# Patient Record
Sex: Male | Born: 1940
Health system: Southern US, Community
[De-identification: ages and names within clinical notes are randomized; demographics above are authoritative.]

## PROBLEM LIST (undated history)

## (undated) DIAGNOSIS — I10 Essential (primary) hypertension: Secondary | ICD-10-CM

## (undated) DIAGNOSIS — I4891 Unspecified atrial fibrillation: Secondary | ICD-10-CM

## (undated) DIAGNOSIS — M109 Gout, unspecified: Secondary | ICD-10-CM

## (undated) DIAGNOSIS — B029 Zoster without complications: Secondary | ICD-10-CM

## (undated) HISTORY — PX: HERNIA REPAIR: SHX51

## (undated) HISTORY — DX: Zoster without complications: B02.9

## (undated) HISTORY — DX: Gout, unspecified: M10.9

## (undated) HISTORY — PX: NO PAST SURGERIES: SHX2092

## (undated) HISTORY — DX: Essential (primary) hypertension: I10

---

## 2000-02-08 ENCOUNTER — Ambulatory Visit (HOSPITAL_BASED_OUTPATIENT_CLINIC_OR_DEPARTMENT_OTHER): Admission: RE | Admit: 2000-02-08 | Discharge: 2000-02-08 | Payer: Self-pay

## 2003-12-24 ENCOUNTER — Encounter: Payer: Self-pay | Admitting: Internal Medicine

## 2004-09-20 ENCOUNTER — Ambulatory Visit: Payer: Self-pay | Admitting: Internal Medicine

## 2005-02-18 ENCOUNTER — Ambulatory Visit: Payer: Self-pay | Admitting: Internal Medicine

## 2005-03-03 ENCOUNTER — Ambulatory Visit: Payer: Self-pay | Admitting: Internal Medicine

## 2005-09-05 ENCOUNTER — Ambulatory Visit: Payer: Self-pay | Admitting: Internal Medicine

## 2005-09-19 ENCOUNTER — Emergency Department (HOSPITAL_COMMUNITY): Admission: EM | Admit: 2005-09-19 | Discharge: 2005-09-19 | Payer: Self-pay | Admitting: Emergency Medicine

## 2005-09-29 ENCOUNTER — Ambulatory Visit: Payer: Self-pay | Admitting: Internal Medicine

## 2006-03-20 ENCOUNTER — Ambulatory Visit: Payer: Self-pay | Admitting: Internal Medicine

## 2006-09-19 ENCOUNTER — Ambulatory Visit: Payer: Self-pay | Admitting: Internal Medicine

## 2006-12-27 ENCOUNTER — Ambulatory Visit: Payer: Self-pay | Admitting: Internal Medicine

## 2007-03-26 ENCOUNTER — Encounter: Payer: Self-pay | Admitting: Internal Medicine

## 2007-03-26 ENCOUNTER — Ambulatory Visit: Payer: Self-pay | Admitting: Internal Medicine

## 2007-03-26 DIAGNOSIS — E785 Hyperlipidemia, unspecified: Secondary | ICD-10-CM | POA: Insufficient documentation

## 2007-03-26 DIAGNOSIS — Z860100 Personal history of colon polyps, unspecified: Secondary | ICD-10-CM | POA: Insufficient documentation

## 2007-03-26 DIAGNOSIS — M109 Gout, unspecified: Secondary | ICD-10-CM

## 2007-03-26 DIAGNOSIS — M1990 Unspecified osteoarthritis, unspecified site: Secondary | ICD-10-CM | POA: Insufficient documentation

## 2007-03-26 DIAGNOSIS — E7800 Pure hypercholesterolemia, unspecified: Secondary | ICD-10-CM | POA: Insufficient documentation

## 2007-03-26 DIAGNOSIS — I10 Essential (primary) hypertension: Secondary | ICD-10-CM

## 2007-03-26 LAB — CONVERTED CEMR LAB
ALT: 22 units/L (ref 0–40)
AST: 23 units/L (ref 0–37)
Albumin: 3.8 g/dL (ref 3.5–5.2)
Alkaline Phosphatase: 54 units/L (ref 39–117)
BUN: 17 mg/dL (ref 6–23)
Basophils Absolute: 0 10*3/uL (ref 0.0–0.1)
Basophils Relative: 0.2 % (ref 0.0–1.0)
Bilirubin, Direct: 0.1 mg/dL (ref 0.0–0.3)
CO2: 33 meq/L — ABNORMAL HIGH (ref 19–32)
Calcium: 9.2 mg/dL (ref 8.4–10.5)
Chloride: 110 meq/L (ref 96–112)
Cholesterol: 170 mg/dL (ref 0–200)
Creatinine, Ser: 1.2 mg/dL (ref 0.4–1.5)
Eosinophils Absolute: 0.1 10*3/uL (ref 0.0–0.6)
Eosinophils Relative: 2.3 % (ref 0.0–5.0)
GFR calc Af Amer: 78 mL/min
GFR calc non Af Amer: 64 mL/min
Glucose, Bld: 96 mg/dL (ref 70–99)
HCT: 48.1 % (ref 39.0–52.0)
HDL: 29.9 mg/dL — ABNORMAL LOW (ref >39.0)
Hemoglobin: 16.5 g/dL (ref 13.0–17.0)
LDL Cholesterol: 101 mg/dL — ABNORMAL HIGH (ref 0–99)
Lymphocytes Relative: 43.3 % (ref 12.0–46.0)
MCHC: 34.3 g/dL (ref 30.0–36.0)
MCV: 88.8 fL (ref 78.0–100.0)
Monocytes Absolute: 0.4 10*3/uL (ref 0.2–0.7)
Monocytes Relative: 10.4 % (ref 3.0–11.0)
Neutro Abs: 1.5 10*3/uL (ref 1.4–7.7)
Neutrophils Relative %: 43.8 % (ref 43.0–77.0)
PSA: 2.71 ng/mL (ref 0.10–4.00)
Platelets: 180 10*3/uL (ref 150–400)
Potassium: 4.3 meq/L (ref 3.5–5.1)
RBC: 5.42 M/uL (ref 4.22–5.81)
RDW: 13.2 % (ref 11.5–14.6)
Sodium: 145 meq/L (ref 135–145)
Total Bilirubin: 1.3 mg/dL — ABNORMAL HIGH (ref 0.3–1.2)
Total CHOL/HDL Ratio: 5.7
Total Protein: 7 g/dL (ref 6.0–8.3)
Triglycerides: 194 mg/dL — ABNORMAL HIGH (ref 0–149)
VLDL: 39 mg/dL (ref 0–40)
WBC: 3.4 10*3/uL — ABNORMAL LOW (ref 4.5–10.5)

## 2007-06-13 ENCOUNTER — Encounter: Payer: Self-pay | Admitting: Internal Medicine

## 2007-09-21 DIAGNOSIS — K5730 Diverticulosis of large intestine without perforation or abscess without bleeding: Secondary | ICD-10-CM | POA: Insufficient documentation

## 2007-09-24 ENCOUNTER — Ambulatory Visit: Payer: Self-pay | Admitting: Internal Medicine

## 2007-10-23 ENCOUNTER — Ambulatory Visit: Payer: Self-pay | Admitting: Internal Medicine

## 2007-10-23 DIAGNOSIS — H60509 Unspecified acute noninfective otitis externa, unspecified ear: Secondary | ICD-10-CM | POA: Insufficient documentation

## 2008-04-21 ENCOUNTER — Ambulatory Visit: Payer: Self-pay | Admitting: Internal Medicine

## 2008-08-18 ENCOUNTER — Ambulatory Visit: Payer: Self-pay | Admitting: Internal Medicine

## 2008-08-18 DIAGNOSIS — J069 Acute upper respiratory infection, unspecified: Secondary | ICD-10-CM | POA: Insufficient documentation

## 2008-10-20 ENCOUNTER — Ambulatory Visit: Payer: Self-pay | Admitting: Internal Medicine

## 2008-11-26 ENCOUNTER — Telehealth: Payer: Self-pay | Admitting: Internal Medicine

## 2008-11-27 ENCOUNTER — Encounter (INDEPENDENT_AMBULATORY_CARE_PROVIDER_SITE_OTHER): Payer: Self-pay | Admitting: *Deleted

## 2008-12-10 ENCOUNTER — Ambulatory Visit: Payer: Self-pay | Admitting: Internal Medicine

## 2008-12-23 ENCOUNTER — Telehealth: Payer: Self-pay | Admitting: Internal Medicine

## 2008-12-23 ENCOUNTER — Ambulatory Visit: Payer: Self-pay | Admitting: Internal Medicine

## 2008-12-23 ENCOUNTER — Encounter: Payer: Self-pay | Admitting: Internal Medicine

## 2008-12-26 ENCOUNTER — Encounter: Payer: Self-pay | Admitting: Internal Medicine

## 2009-01-12 ENCOUNTER — Encounter: Payer: Self-pay | Admitting: Internal Medicine

## 2009-01-22 ENCOUNTER — Telehealth: Payer: Self-pay | Admitting: Internal Medicine

## 2009-04-21 ENCOUNTER — Encounter: Payer: Self-pay | Admitting: Internal Medicine

## 2009-04-22 ENCOUNTER — Ambulatory Visit: Payer: Self-pay | Admitting: Internal Medicine

## 2009-04-22 LAB — CONVERTED CEMR LAB
ALT: 15 units/L (ref 0–53)
AST: 20 units/L (ref 0–37)
Albumin: 3.7 g/dL (ref 3.5–5.2)
Alkaline Phosphatase: 50 units/L (ref 39–117)
BUN: 23 mg/dL (ref 6–23)
Basophils Absolute: 0 10*3/uL (ref 0.0–0.1)
Basophils Relative: 0.2 % (ref 0.0–3.0)
Bilirubin, Direct: 0.1 mg/dL (ref 0.0–0.3)
CO2: 30 meq/L (ref 19–32)
Calcium: 8.9 mg/dL (ref 8.4–10.5)
Chloride: 104 meq/L (ref 96–112)
Cholesterol: 149 mg/dL (ref 0–200)
Creatinine, Ser: 1.3 mg/dL (ref 0.4–1.5)
Eosinophils Absolute: 0.1 10*3/uL (ref 0.0–0.7)
Eosinophils Relative: 1.9 % (ref 0.0–5.0)
GFR calc non Af Amer: 58.17 mL/min (ref >60)
Glucose, Bld: 100 mg/dL — ABNORMAL HIGH (ref 70–99)
HCT: 47.1 % (ref 39.0–52.0)
HDL: 29.2 mg/dL — ABNORMAL LOW (ref >39.00)
Hemoglobin: 16.3 g/dL (ref 13.0–17.0)
LDL Cholesterol: 91 mg/dL (ref 0–99)
Lymphocytes Relative: 40 % (ref 12.0–46.0)
Lymphs Abs: 1.5 10*3/uL (ref 0.7–4.0)
MCHC: 34.7 g/dL (ref 30.0–36.0)
MCV: 89.6 fL (ref 78.0–100.0)
Monocytes Absolute: 0.5 10*3/uL (ref 0.1–1.0)
Monocytes Relative: 12.7 % — ABNORMAL HIGH (ref 3.0–12.0)
Neutro Abs: 1.6 10*3/uL (ref 1.4–7.7)
Neutrophils Relative %: 45.2 % (ref 43.0–77.0)
PSA: 2.74 ng/mL (ref 0.10–4.00)
Platelets: 148 10*3/uL — ABNORMAL LOW (ref 150.0–400.0)
Potassium: 4.5 meq/L (ref 3.5–5.1)
RBC: 5.26 M/uL (ref 4.22–5.81)
RDW: 12.8 % (ref 11.5–14.6)
Sodium: 141 meq/L (ref 135–145)
TSH: 0.92 microintl units/mL (ref 0.35–5.50)
Total Bilirubin: 1.9 mg/dL — ABNORMAL HIGH (ref 0.3–1.2)
Total CHOL/HDL Ratio: 5
Total Protein: 6.6 g/dL (ref 6.0–8.3)
Triglycerides: 146 mg/dL (ref 0.0–149.0)
VLDL: 29.2 mg/dL (ref 0.0–40.0)
WBC: 3.7 10*3/uL — ABNORMAL LOW (ref 4.5–10.5)

## 2009-07-01 ENCOUNTER — Encounter: Payer: Self-pay | Admitting: Internal Medicine

## 2009-08-04 ENCOUNTER — Encounter (INDEPENDENT_AMBULATORY_CARE_PROVIDER_SITE_OTHER): Payer: Self-pay

## 2009-10-28 ENCOUNTER — Ambulatory Visit: Payer: Self-pay | Admitting: Internal Medicine

## 2010-04-26 ENCOUNTER — Ambulatory Visit: Payer: Self-pay | Admitting: Internal Medicine

## 2010-04-26 LAB — CONVERTED CEMR LAB
ALT: 14 units/L (ref 0–53)
AST: 16 units/L (ref 0–37)
Albumin: 3.7 g/dL (ref 3.5–5.2)
Alkaline Phosphatase: 52 units/L (ref 39–117)
BUN: 27 mg/dL — ABNORMAL HIGH (ref 6–23)
Basophils Absolute: 0 10*3/uL (ref 0.0–0.1)
Basophils Relative: 0.6 % (ref 0.0–3.0)
Bilirubin, Direct: 0.2 mg/dL (ref 0.0–0.3)
CO2: 32 meq/L (ref 19–32)
Calcium: 9.2 mg/dL (ref 8.4–10.5)
Chloride: 103 meq/L (ref 96–112)
Cholesterol: 154 mg/dL (ref 0–200)
Creatinine, Ser: 1.3 mg/dL (ref 0.4–1.5)
Direct LDL: 69.5 mg/dL
Eosinophils Absolute: 0.1 10*3/uL (ref 0.0–0.7)
Eosinophils Relative: 2.2 % (ref 0.0–5.0)
GFR calc non Af Amer: 60.69 mL/min (ref >60)
Glucose, Bld: 95 mg/dL (ref 70–99)
HCT: 47.7 % (ref 39.0–52.0)
HDL: 28.5 mg/dL — ABNORMAL LOW (ref >39.00)
Hemoglobin: 16.4 g/dL (ref 13.0–17.0)
Lymphocytes Relative: 37.9 % (ref 12.0–46.0)
Lymphs Abs: 1.5 10*3/uL (ref 0.7–4.0)
MCHC: 34.4 g/dL (ref 30.0–36.0)
MCV: 90.7 fL (ref 78.0–100.0)
Monocytes Absolute: 0.5 10*3/uL (ref 0.1–1.0)
Monocytes Relative: 13 % — ABNORMAL HIGH (ref 3.0–12.0)
Neutro Abs: 1.9 10*3/uL (ref 1.4–7.7)
Neutrophils Relative %: 46.3 % (ref 43.0–77.0)
PSA: 2.67 ng/mL (ref 0.10–4.00)
Platelets: 160 10*3/uL (ref 150.0–400.0)
Potassium: 4.7 meq/L (ref 3.5–5.1)
RBC: 5.26 M/uL (ref 4.22–5.81)
RDW: 13.9 % (ref 11.5–14.6)
Sodium: 140 meq/L (ref 135–145)
TSH: 1.51 microintl units/mL (ref 0.35–5.50)
Total Bilirubin: 1.4 mg/dL — ABNORMAL HIGH (ref 0.3–1.2)
Total CHOL/HDL Ratio: 5
Total Protein: 6.3 g/dL (ref 6.0–8.3)
Triglycerides: 252 mg/dL — ABNORMAL HIGH (ref 0.0–149.0)
VLDL: 50.4 mg/dL — ABNORMAL HIGH (ref 0.0–40.0)
WBC: 4 10*3/uL — ABNORMAL LOW (ref 4.5–10.5)

## 2010-09-28 ENCOUNTER — Ambulatory Visit: Payer: Self-pay | Admitting: Internal Medicine

## 2010-09-28 DIAGNOSIS — K5289 Other specified noninfective gastroenteritis and colitis: Secondary | ICD-10-CM

## 2010-10-04 ENCOUNTER — Telehealth: Payer: Self-pay | Admitting: Internal Medicine

## 2010-10-05 ENCOUNTER — Ambulatory Visit: Payer: Self-pay | Admitting: Gastroenterology

## 2010-10-07 ENCOUNTER — Ambulatory Visit: Payer: Self-pay | Admitting: Internal Medicine

## 2010-10-07 LAB — CONVERTED CEMR LAB
BUN: 21 mg/dL (ref 6–23)
Basophils Absolute: 0 10*3/uL (ref 0.0–0.1)
Basophils Relative: 0.5 % (ref 0.0–3.0)
CO2: 28 meq/L (ref 19–32)
Calcium: 9 mg/dL (ref 8.4–10.5)
Chloride: 100 meq/L (ref 96–112)
Creatinine, Ser: 1.3 mg/dL (ref 0.4–1.5)
Eosinophils Absolute: 0.1 10*3/uL (ref 0.0–0.7)
Eosinophils Relative: 1.3 % (ref 0.0–5.0)
GFR calc non Af Amer: 60.05 mL/min (ref >60.00)
Glucose, Bld: 75 mg/dL (ref 70–99)
HCT: 49.4 % (ref 39.0–52.0)
Hemoglobin: 17 g/dL (ref 13.0–17.0)
Lymphocytes Relative: 40 % (ref 12.0–46.0)
Lymphs Abs: 2 10*3/uL (ref 0.7–4.0)
MCHC: 34.4 g/dL (ref 30.0–36.0)
MCV: 89.5 fL (ref 78.0–100.0)
Monocytes Absolute: 0.6 10*3/uL (ref 0.1–1.0)
Monocytes Relative: 11 % (ref 3.0–12.0)
Neutro Abs: 2.4 10*3/uL (ref 1.4–7.7)
Neutrophils Relative %: 47.2 % (ref 43.0–77.0)
Platelets: 206 10*3/uL (ref 150.0–400.0)
Potassium: 4.3 meq/L (ref 3.5–5.1)
RBC: 5.52 M/uL (ref 4.22–5.81)
RDW: 13.9 % (ref 11.5–14.6)
Sodium: 137 meq/L (ref 135–145)
WBC: 5.1 10*3/uL (ref 4.5–10.5)

## 2010-10-12 ENCOUNTER — Telehealth: Payer: Self-pay | Admitting: Internal Medicine

## 2010-10-13 ENCOUNTER — Ambulatory Visit
Admission: RE | Admit: 2010-10-13 | Discharge: 2010-10-13 | Payer: Self-pay | Source: Home / Self Care | Attending: Internal Medicine | Admitting: Internal Medicine

## 2010-10-13 LAB — CONVERTED CEMR LAB
BUN: 18 mg/dL (ref 6–23)
Basophils Absolute: 0 10*3/uL (ref 0.0–0.1)
Basophils Relative: 0.7 % (ref 0.0–3.0)
CO2: 28 meq/L (ref 19–32)
Calcium: 8.9 mg/dL (ref 8.4–10.5)
Chloride: 104 meq/L (ref 96–112)
Creatinine, Ser: 1.3 mg/dL (ref 0.4–1.5)
Eosinophils Absolute: 0.1 10*3/uL (ref 0.0–0.7)
Eosinophils Relative: 1.7 % (ref 0.0–5.0)
GFR calc non Af Amer: 60.61 mL/min (ref >60.00)
Glucose, Bld: 77 mg/dL (ref 70–99)
HCT: 49.3 % (ref 39.0–52.0)
Hemoglobin: 16.6 g/dL (ref 13.0–17.0)
Lymphocytes Relative: 25.6 % (ref 12.0–46.0)
Lymphs Abs: 1.5 10*3/uL (ref 0.7–4.0)
MCHC: 33.6 g/dL (ref 30.0–36.0)
MCV: 90.8 fL (ref 78.0–100.0)
Monocytes Absolute: 0.6 10*3/uL (ref 0.1–1.0)
Monocytes Relative: 9.4 % (ref 3.0–12.0)
Neutro Abs: 3.7 10*3/uL (ref 1.4–7.7)
Neutrophils Relative %: 62.6 % (ref 43.0–77.0)
Platelets: 197 10*3/uL (ref 150.0–400.0)
Potassium: 4.4 meq/L (ref 3.5–5.1)
RBC: 5.43 M/uL (ref 4.22–5.81)
RDW: 14.1 % (ref 11.5–14.6)
Sodium: 139 meq/L (ref 135–145)
WBC: 6 10*3/uL (ref 4.5–10.5)

## 2010-10-16 ENCOUNTER — Emergency Department (HOSPITAL_COMMUNITY)
Admission: EM | Admit: 2010-10-16 | Discharge: 2010-10-16 | Payer: Self-pay | Source: Home / Self Care | Admitting: Emergency Medicine

## 2010-10-19 ENCOUNTER — Telehealth: Payer: Self-pay | Admitting: Internal Medicine

## 2010-10-22 ENCOUNTER — Ambulatory Visit
Admission: RE | Admit: 2010-10-22 | Discharge: 2010-10-22 | Payer: Self-pay | Source: Home / Self Care | Attending: Internal Medicine | Admitting: Internal Medicine

## 2010-10-22 ENCOUNTER — Inpatient Hospital Stay (HOSPITAL_COMMUNITY)
Admission: AD | Admit: 2010-10-22 | Discharge: 2010-10-26 | Payer: Self-pay | Source: Home / Self Care | Attending: Internal Medicine | Admitting: Internal Medicine

## 2010-10-22 ENCOUNTER — Other Ambulatory Visit: Payer: Self-pay | Admitting: Internal Medicine

## 2010-10-22 ENCOUNTER — Ambulatory Visit: Payer: Self-pay | Admitting: Internal Medicine

## 2010-10-22 LAB — COMPREHENSIVE METABOLIC PANEL
ALT: 32 U/L (ref 0–53)
AST: 31 U/L (ref 0–37)
Albumin: 3.2 g/dL — ABNORMAL LOW (ref 3.5–5.2)
Alkaline Phosphatase: 123 U/L — ABNORMAL HIGH (ref 39–117)
BUN: 23 mg/dL (ref 6–23)
CO2: 27 mEq/L (ref 19–32)
Calcium: 8.9 mg/dL (ref 8.4–10.5)
Chloride: 107 mEq/L (ref 96–112)
Creatinine, Ser: 2.1 mg/dL — ABNORMAL HIGH (ref 0.4–1.5)
GFR: 33.49 mL/min — ABNORMAL LOW (ref >60.00)
Glucose, Bld: 94 mg/dL (ref 70–99)
Potassium: 3.8 mEq/L (ref 3.5–5.1)
Sodium: 142 mEq/L (ref 135–145)
Total Bilirubin: 1.4 mg/dL — ABNORMAL HIGH (ref 0.3–1.2)
Total Protein: 5.9 g/dL — ABNORMAL LOW (ref 6.0–8.3)

## 2010-10-26 ENCOUNTER — Encounter: Payer: Self-pay | Admitting: Internal Medicine

## 2010-10-28 ENCOUNTER — Telehealth: Payer: Self-pay | Admitting: Internal Medicine

## 2010-10-29 ENCOUNTER — Telehealth: Payer: Self-pay | Admitting: Internal Medicine

## 2010-11-01 ENCOUNTER — Encounter: Payer: Self-pay | Admitting: Internal Medicine

## 2010-11-01 LAB — URINALYSIS, ROUTINE W REFLEX MICROSCOPIC
Bilirubin Urine: NEGATIVE
Ketones, ur: NEGATIVE mg/dL
Leukocytes, UA: NEGATIVE
Nitrite: NEGATIVE
Protein, ur: NEGATIVE mg/dL
Specific Gravity, Urine: 1.009 (ref 1.005–1.030)
Urine Glucose, Fasting: NEGATIVE mg/dL
Urobilinogen, UA: 1 mg/dL (ref 0.0–1.0)
pH: 5.5 (ref 5.0–8.0)

## 2010-11-01 LAB — BASIC METABOLIC PANEL
BUN: 21 mg/dL (ref 6–23)
BUN: 13 mg/dL (ref 6–23)
CO2: 24 mEq/L (ref 19–32)
CO2: 25 mEq/L (ref 19–32)
Calcium: 8.4 mg/dL (ref 8.4–10.5)
Calcium: 8.5 mg/dL (ref 8.4–10.5)
Chloride: 108 mEq/L (ref 96–112)
Chloride: 114 mEq/L — ABNORMAL HIGH (ref 96–112)
Creatinine, Ser: 1.7 mg/dL — ABNORMAL HIGH (ref 0.4–1.5)
Creatinine, Ser: 1.47 mg/dL (ref 0.4–1.5)
GFR calc Af Amer: 48 mL/min — ABNORMAL LOW (ref >60)
GFR calc Af Amer: 57 mL/min — ABNORMAL LOW (ref >60)
GFR calc non Af Amer: 40 mL/min — ABNORMAL LOW (ref >60)
GFR calc non Af Amer: 47 mL/min — ABNORMAL LOW (ref >60)
Glucose, Bld: 78 mg/dL (ref 70–99)
Glucose, Bld: 77 mg/dL (ref 70–99)
Potassium: 3.9 mEq/L (ref 3.5–5.1)
Potassium: 5 mEq/L (ref 3.5–5.1)
Sodium: 138 mEq/L (ref 135–145)
Sodium: 144 mEq/L (ref 135–145)

## 2010-11-01 LAB — STOOL CULTURE

## 2010-11-01 LAB — CBC
HCT: 43.1 % (ref 39.0–52.0)
Hemoglobin: 14.5 g/dL (ref 13.0–17.0)
MCH: 29.8 pg (ref 26.0–34.0)
MCHC: 33.6 g/dL (ref 30.0–36.0)
MCV: 88.7 fL (ref 78.0–100.0)
Platelets: 160 10*3/uL (ref 150–400)
RBC: 4.86 MIL/uL (ref 4.22–5.81)
RDW: 13.5 % (ref 11.5–15.5)
WBC: 4.9 10*3/uL (ref 4.0–10.5)

## 2010-11-01 LAB — OVA AND PARASITE EXAMINATION: Ova and parasites: NONE SEEN

## 2010-11-01 LAB — FECAL LACTOFERRIN, QUANT: Fecal Lactoferrin: POSITIVE

## 2010-11-01 LAB — URINE MICROSCOPIC-ADD ON

## 2010-11-01 LAB — CLOSTRIDIUM DIFFICILE BY PCR: Toxigenic C. Difficile by PCR: NEGATIVE

## 2010-11-08 NOTE — Discharge Summary (Signed)
Jose Hood, Jose Hood                  ACCOUNT NO.:  [id number]  MEDICAL RECORD NO.:  [id number]          PATIENT TYPE:  OBV  LOCATION:  1319                         FACILITY:  [facility]  PHYSICIAN:  Iva Boop, MD,FACGDATE OF BIRTH:  1940/11/14  DATE OF ADMISSION:  10/22/2010 DATE OF DISCHARGE:  10/26/2010                              DISCHARGE SUMMARY   ADMITTING DIAGNOSES: 30. 70 year old male with 5-week history of diarrhea, complicated by     diarrhea and renal insufficiency. 2. History of hypertension. 3. Benign prostatic hypertrophy. 4. Dyslipidemia. 5. History of gout. 6. History of diverticulosis.  DISCHARGE DIAGNOSES: 1. Persistent diarrhea, etiology not clear, with negative stool     studies and negative colonoscopy this admission with random     biopsies pending.  Possible microscopic colitis. Biopsies showed microscopic colitis. 2. Urinary retention, improved after Foley insertion.  The patient     will be discharged home with Foley in place for Urology followup. 3. Dehydration, resolved. 4. Other diagnoses, as listed above.  CONSULTATIONS:  Urology, Bertram Millard. Dahlstedt, MD.  PROCEDURES: 1. Colonoscopy per Dr. Leone Payor on 01/09. 2. CT scan of the abdomen and pelvis on 01/06.  BRIEF HISTORY:  Jose Hood is a pleasant 70 year old male who was seen by Dr. Leone Payor in the office on 10/22/2010.  At that time, he had complained of a 5-6 week history of diarrhea that began after he had gone on a bus trip; he works as a Pharmacologist.  He was not aware of any sick contacts. He had not had any recent antibiotics or any new medications.  However after this diarrhea started, he had been given a course of Cipro by his primary care physician which did not relieve the problem.  He describes gas, bloating, early satiety and post prandial diarrhea.  He had stool studies ordered in the office; however, these were not completed as the stool was too formed to do so.  He had been  using Lomotil three times a day.  He has been having around 4-5 bowel movements per day, no fever. He does complain of weakness.  He was started on an empiric trial of Flagyl per Dr. Leone Payor but was unable to tolerate this with vomiting. On the day of admission, he was complaining of weakness, dyspnea on exertion.  He had been losing weight, complained of loose watery stools and was admitted with a creatinine of 2.1 which is new for him for IV fluid hydration and further diagnostic workup to include a CT scan of the abdomen and pelvis.  PERTINENT LABORATORY AND X-RAY DATA:  On admission 01/06, electrolytes within normal limits.  Potassium was 3.8, BUN 23, creatinine 2.1.  Total bilirubin 1.4, alk phos 123, CO2 31.  PT 32.  Albumin 3.2.  Stool for C difficile by PCR negative.  Stool culture negative, lactoferrin positive, O and P negative.  Followup labs on 01/08 showed WBC of 4.9, hemoglobin 14.5, hematocrit of 43.1, platelets 160.  Electrolytes within normal limits.  BUN 13, creatinine 1.47.  And, UA on the 8th showed alarge amount of blood (this was with insertion of  the Foley), 0-2 WBCs and rare bacteria.  CT scan of the abdomen and pelvis:  Lung bases clear.  Stable calcified granulomas within the liver, gallbladder normal, pancreas normal, spleen unremarkable, adrenal glands normal.  There are bilateral renal cysts. No evidence of obstructive uropathy.  Prostate gland is markedly enlarged and exerts mass effect upon the bladder base.  No enlarged lymph nodes.  Sigmoid diverticulosis.  HOSPITAL COURSE:  The patient was admitted to the service of Dr. Stan Head.  He was placed on IV fluid hydration.  Stool studies were obtained and held off on empiric antibiotics as he could not tolerate oral metronidazole.  His diet was advanced.  He tolerated this without difficulty and really did not manifest a lot of diarrhea during this hospitalization.  On 01/08, he complained of difficulty  voiding. Bladder scan was done and he had at least 500 mL residual, unable to insert a Foley and Urology was called.  He was seen by Dr. Retta Diones. Foley was placed and he had 500 mL urine out immediately.  Plan was for him to leave the Foley in, to be discharged home with the Foley and follow up as an outpatient in 1 week with Dr. Retta Diones.  He also added Flomax 0.4 mg daily to his regimen.  His renal insufficiency improved. He had some soft stools and then some intermittent watery stools.  On 01/09, he complained of another larger volume watery stool.  Decision was made to proceed with flexible sigmoidoscopy/colonoscopy with Dr. Leone Payor.  He was prepped and underwent the procedure on the 10th.  He tolerated this without difficulty.  He was noted to have moderate diverticulosis, otherwise a normal exam save internal hemorrhoids. Random biopsies were taken.  At this time he was felt stable for discharge to home with instructions to continue Lomotil three times daily as needed for diarrhea.  He will go home on Flomax 0.4 daily, continue his usual Inderal 160 daily, lisinopril 40 mg daily, Align 1 p.o. daily, terazosin 10 mg daily, and probenecid 500 mg daily.  CONDITION ON DISCHARGE:  Stable.  He will have an appointment with Dr. Retta Diones next week and we will call him with results of his colon biopsies and further plans will be made for treatment based on the biopsy results.  Dictated for:  Iva Boop, MD.     Mike Gip, PA-C   ______________________________ Iva Boop, MD,FACG    AE/MEDQ  D:  10/26/2010  T:  10/26/2010  Job:  [id number]  cc:   Bertram Millard. Dahlstedt, M.D. Fax: [phone]  Electronically Signed by AMY ESTERWOOD PA-C on 11/04/2010 04:24:13 PM Electronically Signed by Stan Head MDFACG on 11/08/2010 10:38:11 AM

## 2010-11-14 LAB — CONVERTED CEMR LAB
ALT: 13 units/L (ref 0–53)
AST: 17 units/L (ref 0–37)
Albumin: 3.7 g/dL (ref 3.5–5.2)
Alkaline Phosphatase: 56 units/L (ref 39–117)
BUN: 17 mg/dL (ref 6–23)
Basophils Absolute: 0 10*3/uL (ref 0.0–0.1)
Basophils Relative: 0.2 % (ref 0.0–1.0)
Bilirubin, Direct: 0.1 mg/dL (ref 0.0–0.3)
CO2: 34 meq/L — ABNORMAL HIGH (ref 19–32)
Calcium: 9.7 mg/dL (ref 8.4–10.5)
Chloride: 104 meq/L (ref 96–112)
Cholesterol: 157 mg/dL (ref 0–200)
Creatinine, Ser: 1.2 mg/dL (ref 0.4–1.5)
Direct LDL: 71.3 mg/dL
Eosinophils Absolute: 0.1 10*3/uL (ref 0.0–0.7)
Eosinophils Relative: 1 % (ref 0.0–5.0)
GFR calc Af Amer: 77 mL/min
GFR calc non Af Amer: 64 mL/min
Glucose, Bld: 87 mg/dL (ref 70–99)
HCT: 50.1 % (ref 39.0–52.0)
HDL: 29.7 mg/dL — ABNORMAL LOW (ref >39.0)
Hemoglobin: 16.9 g/dL (ref 13.0–17.0)
Lymphocytes Relative: 32.7 % (ref 12.0–46.0)
MCHC: 33.7 g/dL (ref 30.0–36.0)
MCV: 90.5 fL (ref 78.0–100.0)
Monocytes Absolute: 0.6 10*3/uL (ref 0.1–1.0)
Monocytes Relative: 9.9 % (ref 3.0–12.0)
Neutro Abs: 3.1 10*3/uL (ref 1.4–7.7)
Neutrophils Relative %: 56.2 % (ref 43.0–77.0)
Platelets: 189 10*3/uL (ref 150–400)
Potassium: 4.8 meq/L (ref 3.5–5.1)
RBC: 5.54 M/uL (ref 4.22–5.81)
RDW: 12.6 % (ref 11.5–14.6)
Sodium: 143 meq/L (ref 135–145)
TSH: 1.2 microintl units/mL (ref 0.35–5.50)
Total Bilirubin: 1.5 mg/dL — ABNORMAL HIGH (ref 0.3–1.2)
Total CHOL/HDL Ratio: 5.3
Total Protein: 7 g/dL (ref 6.0–8.3)
Triglycerides: 210 mg/dL (ref 0–149)
VLDL: 42 mg/dL — ABNORMAL HIGH (ref 0–40)
WBC: 5.6 10*3/uL (ref 4.5–10.5)

## 2010-11-16 NOTE — Assessment & Plan Note (Signed)
Summary: 6 month rov/njr//SLM   Vital Signs:  Patient profile:   70 year old male Weight:      204 pounds BMI:     27.77 Temp:     97.6 degrees F oral BP sitting:   138 / 90  (left arm) Cuff size:   regular  Vitals Entered By: Raechel Ache, RN (October 28, 2009 10:40 AM) CC: 6 mo ROV   CC:  6 mo ROV.  History of Present Illness: 41 -year-old patient who is in today for follow-up of his multiple medical problems.  He has a  history treated hypertension.  He monitors blood pressure readings at home with  normal to low normal readings.  He remains active.  He denies any cardiopulmonary complaints.  He has a history of gout, which remains asymptomatic.  There is a history of colonic polyps.  He did have a follow-up colonoscopy last year.  His osteoarthritis has been stable.  He has a history of dyslipidemia, controlled off medications.  Allergies: No Known Drug Allergies  Past History:  Past Medical History: Reviewed history from 04/22/2009 and no changes required. Colonic polyps, hx of Gout Hypertension BPH Diverticulosis, colon external hemorrhoids Hyperlipidemia  Past Surgical History: Reviewed history from 04/22/2009 and no changes required. Inguinal herniorrhaphy: '89 '01 colonoscopy 2005, 2010  Review of Systems  The patient denies anorexia, fever, weight loss, weight gain, vision loss, decreased hearing, hoarseness, chest pain, syncope, dyspnea on exertion, peripheral edema, prolonged cough, headaches, hemoptysis, abdominal pain, melena, hematochezia, severe indigestion/heartburn, hematuria, incontinence, genital sores, muscle weakness, suspicious skin lesions, transient blindness, difficulty walking, depression, unusual weight change, abnormal bleeding, enlarged lymph nodes, angioedema, breast masses, and testicular masses.    Physical Exam  General:  Well-developed,well-nourished,in no acute distress; alert,appropriate and cooperative throughout examination;  110/80 Head:  Normocephalic and atraumatic without obvious abnormalities. No apparent alopecia or balding. Eyes:  No corneal or conjunctival inflammation noted. EOMI. Perrla. Funduscopic exam benign, without hemorrhages, exudates or papilledema. Vision grossly normal. Mouth:  Oral mucosa and oropharynx without lesions or exudates.  Teeth in good repair. Neck:  No deformities, masses, or tenderness noted. Chest Wall:  No deformities, masses, tenderness or gynecomastia noted. Lungs:  Normal respiratory effort, chest expands symmetrically. Lungs are clear to auscultation, no crackles or wheezes. Heart:  Normal rate and regular rhythm. S1 and S2 normal without gallop, murmur, click, rub or other extra sounds. Abdomen:  Bowel sounds positive,abdomen soft and non-tender without masses, organomegaly or hernias noted. Msk:  No deformity or scoliosis noted of thoracic or lumbar spine.   Pulses:  R and L carotid,radial,femoral,dorsalis pedis and posterior tibial pulses are full and equal bilaterally Extremities:  No clubbing, cyanosis, edema, or deformity noted with normal full range of motion of all joints.     Impression & Recommendations:  Problem # 1:  OSTEOARTHRITIS (ICD-715.90)  Problem # 2:  HYPERLIPIDEMIA (ICD-272.4)  Problem # 3:  GOUT (ICD-274.9)  His updated medication list for this problem includes:    Probenecid 500 Mg Tabs (Probenecid) .Marland Kitchen... 1 once daily  His updated medication list for this problem includes:    Probenecid 500 Mg Tabs (Probenecid) .Marland Kitchen... 1 once daily  Problem # 4:  HYPERTENSION (ICD-401.9)  His updated medication list for this problem includes:    Propranolol Hcl Cr 160 Mg Cp24 (Propranolol hcl) .Marland Kitchen... 1 once daily    Lisinopril 40 Mg Tabs (Lisinopril) .Marland Kitchen... 1 once daily    Terazosin Hcl 10 Mg Caps (Terazosin hcl) .Marland KitchenMarland KitchenMarland KitchenMarland Kitchen  1 once daily  His updated medication list for this problem includes:    Propranolol Hcl Cr 160 Mg Cp24 (Propranolol hcl) .Marland Kitchen... 1 once daily     Lisinopril 40 Mg Tabs (Lisinopril) .Marland Kitchen... 1 once daily    Terazosin Hcl 10 Mg Caps (Terazosin hcl) .Marland Kitchen... 1 once daily  Problem # 5:  COLONIC POLYPS, HX OF (ICD-V12.72)  Orders: Prescription Created Electronically [id number])  Complete Medication List: 1)  Propranolol Hcl Cr 160 Mg Cp24 (Propranolol hcl) .Marland Kitchen.. 1 once daily 2)  Probenecid 500 Mg Tabs (Probenecid) .Marland Kitchen.. 1 once daily 3)  Lisinopril 40 Mg Tabs (Lisinopril) .Marland Kitchen.. 1 once daily 4)  Terazosin Hcl 10 Mg Caps (Terazosin hcl) .Marland Kitchen.. 1 once daily 5)  Hydrocodone-homatropine 5-1.5 Mg/60ml Syrp (Hydrocodone-homatropine) .Marland Kitchen.. 1 teaspoon every 6 hours as needed for cough  Patient Instructions: 1)  Please schedule a follow-up appointment in 6 months. 2)  Limit your Sodium (Salt). 3)  It is important that you exercise regularly at least 20 minutes 5 times a week. If you develop chest pain, have severe difficulty breathing, or feel very tired , stop exercising immediately and seek medical attention. Prescriptions: TERAZOSIN HCL 10 MG  CAPS (TERAZOSIN HCL) 1 once daily  #90 x 6   Entered and Authorized by:   Gordy Savers  MD   Signed by:   Gordy Savers  MD on 10/28/2009   Method used:   Print then Give to Patient   RxID:   [id number] LISINOPRIL 40 MG  TABS (LISINOPRIL) 1 once daily  #90 x 6   Entered and Authorized by:   Gordy Savers  MD   Signed by:   Gordy Savers  MD on 10/28/2009   Method used:   Print then Give to Patient   RxID:   [id number] PROBENECID 500 MG  TABS (PROBENECID) 1 once daily  #90 x 6   Entered and Authorized by:   Gordy Savers  MD   Signed by:   Gordy Savers  MD on 10/28/2009   Method used:   Print then Give to Patient   RxID:   [id number] PROPRANOLOL HCL CR 160 MG  CP24 (PROPRANOLOL HCL) 1 once daily  #90 x 6   Entered and Authorized by:   Gordy Savers  MD   Signed by:   Gordy Savers  MD on 10/28/2009   Method used:   Print then Give to Patient    RxID:   [id number] TERAZOSIN HCL 10 MG  CAPS (TERAZOSIN HCL) 1 once daily  #90 x 6   Entered and Authorized by:   Gordy Savers  MD   Signed by:   Gordy Savers  MD on 10/28/2009   Method used:   Electronically to        [id number] (retail)       [address]       [place], [place]  [address]       Ph: [phone] or [phone]       Fax: [phone]   RxID:   [id number] LISINOPRIL 40 MG  TABS (LISINOPRIL) 1 once daily  #90 x 6   Entered and Authorized by:   Gordy Savers  MD   Signed by:   Gordy Savers  MD on 10/28/2009   Method used:   Electronically to        [id number] (  [facility]ail)       [address]       [place], [place]  [address]       Ph: [phone] or [phone]       Fax: [phone]   RxID:   [id number] PROBENECID 500 MG  TABS (PROBENECID) 1 once daily  #90 x 6   Entered and Authorized by:   Gordy Savers  MD   Signed by:   Gordy Savers  MD on 10/28/2009   Method used:   Electronically to        [id number] ([facility]       [address]       [place], [place]  [address]       Ph: [phone] or [phone]       Fax: [phone]   RxID:   [id number] PROPRANOLOL HCL CR 160 MG  CP24 (PROPRANOLOL HCL) 1 once daily  #90 x 6   Entered and Authorized by:   Gordy Savers  MD   Signed by:   Gordy Savers  MD on 10/28/2009   Method used:   [facility]ronically to        [id number]       [address]       [place], [place]  [address]       Ph: [phone] or [phone]       Fax: [phone]   RxID:   [id number]

## 2010-11-16 NOTE — Assessment & Plan Note (Signed)
Summary: PT WILL COME IN FASTING/NJR   Vital Signs:  Patient profile:   69 year old male Height:      73 inches Weight:      206 pounds BMI:     27.28 Temp:     97.7 degrees F oral BP sitting:   118 / 78  (right arm) Cuff size:   regular  Vitals Entered By: Duard Brady LPN (April 26, 2010 8:32 AM) CC: cpx - doing well Is Patient Diabetic? No   CC:  cpx - doing well.  History of Present Illness: is it should medical ohms.  Include colonic polyps, dyslipidemia, and a history of hypertension.  He is a history of gout, which has been stable, as well as osteoarthritis.  His last colonoscopy was last year. His only new complaint is drainage from the left ear.  He does have a history of a prior ruptured left tympanic membrane and prior history of otitis externa.  There is no ear pain.  He does have some chronic diminished auditory acuity on the left.  Here for Medicare AWV:  1.   Risk factors based on Past M, S, F history:  bibasilar risk factors include a history of hypertension, dyslipidemia, and family history of coronary artery disease.  The patient has a personal history of colonic polyps. 2.   Physical Activities:  remains fairly active with   daily walking; no exercise limitations. 3.   Depression/mood: no history of depression or mood disorder 4.   Hearing: diminished auditory acuity on the left 5.   ADL's: totally independent in all aspects  of daily living 6.   Fall Risk: low 7.   Home Safety: no problems identified 8.   Height, weight, &visual acuity:height and weight are stable.  No change in visual acuity 9.   Counseling: regular exercise on a heart healthy diet.  Encouraged follow-up colonoscopy in 4 years 10.   Labs ordered based on risk factors: complete laboratory profile reviewed, including lipid profile 11.           Referral Coordination-not indicated at this time 12.           Care Plan- heart healthy diet, and more regular exercise.  Encouraged 13.             Cognitive Assessment- alert and oriented, with normal affect   Preventive Screening-Counseling & Management  Alcohol-Tobacco     Smoking Status: never  Allergies: No Known Drug Allergies  Past History:  Past Medical History: Reviewed history from 04/22/2009 and no changes required. Colonic polyps, hx of Gout Hypertension BPH Diverticulosis, colon external hemorrhoids Hyperlipidemia  Past Surgical History: Reviewed history from 04/22/2009 and no changes required. Inguinal herniorrhaphy: '89 '01 colonoscopy 2005, 2010  Family History: Reviewed history from 04/22/2009 and no changes required. Family History of CAD Male 1st degree relative <60 Family History Kidney disease Father s/p MI Mother -Kidney Ca   father died age 79, MI mother died at  9, renal cancer  One brother-age 25 l&w  Social History: Reviewed history from 04/22/2009 and no changes required. Never Smoked Married  son and daughter -  5 grandchildren  Review of Systems  The patient denies anorexia, fever, weight loss, weight gain, vision loss, decreased hearing, hoarseness, chest pain, syncope, dyspnea on exertion, peripheral edema, prolonged cough, headaches, hemoptysis, abdominal pain, melena, hematochezia, severe indigestion/heartburn, hematuria, incontinence, genital sores, muscle weakness, suspicious skin lesions, transient blindness, difficulty walking, depression, unusual weight change, abnormal bleeding, enlarged lymph  nodes, angioedema, breast masses, and testicular masses.    Physical Exam  General:  Well-developed,well-nourished,in no acute distress; alert,appropriate and cooperative throughout examination Head:  Normocephalic and atraumatic without obvious abnormalities. No apparent alopecia or balding. Eyes:  No corneal or conjunctival inflammation noted. EOMI. Perrla. Funduscopic exam benign, without hemorrhages, exudates or papilledema. Vision grossly normal. Ears:  L canal inflamed  and L canal drainage.  L canal inflamed and L canal drainage.   Nose:  External nasal examination shows no deformity or inflammation. Nasal mucosa are pink and moist without lesions or exudates. Mouth:  Oral mucosa and oropharynx without lesions or exudates.  Teeth in good repair. Neck:  No deformities, masses, or tenderness noted. Chest Wall:  No deformities, masses, tenderness or gynecomastia noted. Breasts:  No masses or gynecomastia noted Lungs:  Normal respiratory effort, chest expands symmetrically. Lungs are clear to auscultation, no crackles or wheezes. Heart:  Normal rate and regular rhythm. S1 and S2 normal without gallop, murmur, click, rub or other extra sounds. Abdomen:  Bowel sounds positive,abdomen soft and non-tender without masses, organomegaly or hernias noted. Rectal:  No external abnormalities noted. Normal sphincter tone. No rectal masses or tenderness. Genitalia:  Testes bilaterally descended without nodularity, tenderness or masses. No scrotal masses or lesions. No penis lesions or urethral discharge. Prostate:  3+ enlarged.  3+ enlarged.   Msk:  No deformity or scoliosis noted of thoracic or lumbar spine.   Pulses:  R and L carotid,radial,femoral,dorsalis pedis and posterior tibial pulses are full and equal bilaterally Extremities:  No clubbing, cyanosis, edema, or deformity noted with normal full range of motion of all joints.   Neurologic:  No cranial nerve deficits noted. Station and gait are normal. Plantar reflexes are down-going bilaterally. DTRs are symmetrical throughout. Sensory, motor and coordinative functions appear intact.   Impression & Recommendations:  Problem # 1:  PREVENTIVE HEALTH CARE (ICD-V70.0)  Orders: First annual wellness visit with prevention plan  (Z6109)  Complete Medication List: 1)  Propranolol Hcl Cr 160 Mg Cp24 (Propranolol hcl) .Marland Kitchen.. 1 once daily 2)  Probenecid 500 Mg Tabs (Probenecid) .Marland Kitchen.. 1 once daily 3)  Lisinopril 40 Mg Tabs  (Lisinopril) .Marland Kitchen.. 1 once daily 4)  Terazosin Hcl 10 Mg Caps (Terazosin hcl) .Marland Kitchen.. 1 once daily 5)  Hydrocodone-homatropine 5-1.5 Mg/55ml Syrp (Hydrocodone-homatropine) .Marland Kitchen.. 1 teaspoon every 6 hours as needed for cough 6)  Neomycin-polymyxin-hc 3.5-10000-1 Susp (Neomycin-polymyxin-hc) .... Two drops to the left ear 4 times daily  Other Orders: EKG w/ Interpretation (93000) Venipuncture (60454) TLB-Lipid Panel (80061-LIPID) TLB-BMP (Basic Metabolic Panel-BMET) (80048-METABOL) TLB-CBC Platelet - w/Differential (85025-CBCD) TLB-Hepatic/Liver Function Pnl (80076-HEPATIC) TLB-TSH (Thyroid Stimulating Hormone) (84443-TSH) TLB-PSA (Prostate Specific Antigen) (84153-PSA)  Patient Instructions: 1)  Please schedule a follow-up appointment in 6 months. 2)  Limit your Sodium (Salt). 3)  It is important that you exercise regularly at least 20 minutes 5 times a week. If you develop chest pain, have severe difficulty breathing, or feel very tired , stop exercising immediately and seek medical attention. 4)  Check your Blood Pressure regularly. If it is above: 150/90  you should make an appointment. Prescriptions: NEOMYCIN-POLYMYXIN-HC 3.5-10000-1 SUSP (NEOMYCIN-POLYMYXIN-HC) two drops to the left ear 4 times daily  #10 cc x 1   Entered and Authorized by:   Gordy Savers  MD   Signed by:   Gordy Savers  MD on 04/26/2010   Method used:   [facility] to        [facility]  [phone][facility] (retail)  [address]       [place], [place]  [address]       Ph: [phone] or [phone]       Fax: [phone]   RxID:   [id number] TERAZOSIN HCL 10 MG  CAPS (TERAZOSIN HCL) 1 once daily  #90 x 6   Entered and Authorized by:   Gordy Savers  MD   Signed by:   Gordy Savers  MD on 04/26/2010   Method used:   [facility]ronically to        [id number]       [address]       [place], [place]  [address]       Ph:  [phone] or [phone]       Fax: [phone]   RxID:   [id number] LISINOPRIL 40 MG  TABS (LISINOPRIL) 1 once daily  #90 x 6   Entered and Authorized by:   Gordy Savers  MD   Signed by:   Gordy Savers  MD on 04/26/2010   Method used:   [facility]ronically to        [id number]       [address]       [place], [place]  [address]       Ph: [phone] or [phone]       Fax: [phone]   RxID:   [id number] PROBENECID 500 MG  TABS (PROBENECID) 1 once daily  #90 x 6   Entered and Authorized by:   Gordy Savers  MD   Signed by:   Gordy Savers  MD on 04/26/2010   Method used:   Electronically to        [id number]       [address]       [place], [place]  [address]       Ph: [phone] or [phone]       Fax: [phone]   RxID:   [id number] PROPRANOLOL HCL CR 160 MG  CP24 (PROPRANOLOL HCL) 1 once daily  #90 x 6   Entered and Authorized by:   Gordy Savers  MD   Signed by:   Gordy Savers  MD on 04/26/2010   Method used:   [facility]ronically to        [id number]       [address]       [place], [place]  [address]       Ph: [phone] or [phone]       Fax: [phone]   RxID:   [id number]   Appended Document: PT WILL COME IN FASTING/NJR Assessment:      1. HTN- stable-will continue present Rx      2. Otitis media-will treat will cortisporin otic      3. Gout-continue probenecid

## 2010-11-17 NOTE — Consult Note (Signed)
Jose Hood, Jose Hood                  ACCOUNT NO.:  [id number]  MEDICAL RECORD NO.:  [id number]          PATIENT TYPE:  OBV  LOCATION:  1319                         FACILITY:  [facility]  PHYSICIAN:  Bertram Millard. Elvyn Krohn, M.D.DATE OF BIRTH:  1941-08-03  DATE OF CONSULTATION:  10/24/2010 DATE OF DISCHARGE:                                CONSULTATION   REASON FOR CONSULTATION:  Urinary retention.  BRIEF HISTORY:  A 70 year old male with prior symptoms of BPH, he has been told that he had a large prostate as well.  He is on terazosin, but apparently for hypertension.  He has been on that medicine for about 20 years.  The patient presented to the hospital for increased number of stools over the past few weeks that has been resistant to outpatient treatment.  While in the hospital, he has had a hard time voiding, and has had some abdominal distention.  He has had a slower stream, and postvoid residual was performed today.  Recently on bladder scan, the patient was found to have a residual urine of about 600 cc.  Foley catheter attempt was unsuccessful and urologic consultation was requested.  The patient still has some feeling of needing to urinate and incomplete emptying.  He is not in severe pain at this point, but is uncomfortable. He has been voiding small amounts.  MEDICAL HISTORY:  Significant for hypertension, gout, BPH, diverticulosis, history of hemorrhoids, and hypercholesterolemia.  He has had hernia repairs in 1989 and 2001.  MEDICATIONS:  Inderal 160 mg daily, probenecid 500 mg daily, lisinopril 40 mg daily, terazosin 10 mg a day, Align, and diphenoxylate/atropine one 3 times a day.  ALLERGIES:  The patient has sensitivity to METRONIDAZOLE, which causes nausea and vomiting.  FAMILY HISTORY:  Significant for a large prostate, kidney cancer, coronary artery disease.  The patient is retired from [employer].  He is a Naval architect.  He still works part-time with holiday tours as a  Midwife.  He has 2 children.  He does not use tobacco or alcohol.  He lives near [place].  REVIEW OF SYSTEMS:  Significant for recent diarrhea.  He also has abdominal distention.  He has no genital pain.  PHYSICAL EXAMINATION:  GENERAL:  A pleasant elderly male.  He was awake, alert, and oriented. VITAL SIGNS:  Normal.  He was afebrile. ABDOMEN:  Flat, soft, nondistended with minimal suprapubic fullness and tenderness.  There were no inguinal hernias.  Phallus was uncircumcised. There was distal penile hypospadias.  Meatus was wide open.  Testicles, cords and epididymal structures as well as scrotal skin were normal. RECTAL:  Deferred.  After the patient's consent, I placed an 18-French Coude  tip catheter under sterile conditions without difficulty.  About 500-600 cc of dark urine was obtained.  He was hooked to a leg bag.  IMPRESSION: 1. Retention, secondary to benign prostatic hyperplasia.  The patient     has a fairly large residual urine.  He has been on alpha blockers,     and still has this problem despite that. 2. Benign prostatic hyperplasia, moderate symptoms, despite being on  alpha blockers.  PLAN: 1. I will add Flomax, both in the hospital and also give him a     prescription for to take home. 2. I think it okay to send him home with a catheter - we will instruct     the patient how to use a leg bag as well for comfort at home. 3. I have asked the patient's wife to give [place] a call to schedule an     appointment later on in the week for voiding trial.  I think it     worthwhile to consider eventual treatment with 5-alpha reductase     inhibitors.     Bertram Millard. Nyeemah Jennette, M.D.     SMD/MEDQ  D:  10/24/2010  T:  10/24/2010  Job:  [id number]  cc:   Iva Boop, MD,FACG [facility] [address] [place], [place] [address]  Gordy Savers, MD [address] [place] [address]  Electronically Signed by Marcine Matar M.D. on  11/17/2010 14:78:29 PM

## 2010-11-18 NOTE — Procedures (Signed)
Summary: Colonoscopy   Colonoscopy  Procedure date:  12/24/2003  Findings:      Location:  [facility].   Patient Name: Jose Hood, Jose Hood MRN:  Procedure Procedures: Colonoscopy CPT: [phone].  Personnel: Endoscopist: Iva Boop, MD, [facility].  Referred By: Gordy Savers, MD.  Exam Location: Exam performed in Outpatient Clinic. Outpatient  Patient Consent: Procedure, Alternatives, Risks and Benefits discussed, consent obtained, from patient. Consent was obtained by the RN.  Indications  Increased Risk Screening: For family history of colorectal neoplasia, in   Comments: Maternal uncle had colon cancer. History  Current Medications: Patient is not currently taking Coumadin.  Pre-Exam Physical: Performed Dec 24, 2003. Cardio-pulmonary exam, Rectal exam, HEENT exam , Abdominal exam, Mental status exam WNL.  Exam Exam: Extent of exam reached: Cecum, extent intended: Cecum.  The cecum was identified by appendiceal orifice and IC valve. Patient position: on left side. Colon retroflexion performed. Images taken. ASA Classification: II. Tolerance: excellent.  Monitoring: Pulse and BP monitoring, Oximetry used. Supplemental O2 given.  Colon Prep Used MiraLax for colon prep. Prep results: excellent.  Sedation Meds: Patient assessed and found to be appropriate for moderate (conscious) sedation. Fentanyl 75 mcg. given IV. Versed 7 mg. given IV.  Findings - NORMAL EXAM: Ascending Colon to Descending Colon.  POLYP: Cecum, Maximum size: 4 mm. diminutive, Procedure:  biopsy without cautery, removed, retrieved, Polyp sent to pathology. ICD9: Neoplasia, Benign, Large Bowel: 211.3.  DIVERTICULOSIS: Sigmoid Colon. ICD9: Diverticulosis, Colon: 562.10. Comments: moderate.  HEMORRHOIDS: External. Size: Grade I. Not bleeding. ICD9: Hemorrhoids, External: 455.3.   Assessment Abnormal examination, see findings above.  Diagnoses: 562.10: Diverticulosis, Colon.  455.3:  Hemorrhoids, External.  211.3: Neoplasia, Benign, Large Bowel.   Comments: One tiny cecal polyp, moderate diverticulosis and very small hemorrhoids. Events  Unplanned Interventions: No intervention was required.  Plans Patient Education: Patient given standard instructions for: Polyps. Diverticulosis. Hemorrhoids.  Disposition: After procedure patient sent to recovery. After recovery patient sent home.  Scheduling/Referral: Colonoscopy, to Iva Boop, MD, [facility], 5 years, around Dec 23, 2008.

## 2010-11-18 NOTE — Assessment & Plan Note (Signed)
Summary: gas/bloating/sheri   History of Present Illness Visit Type: Follow-up Visit Primary GI MD: Stan Head [facility] [address] Primary Provider: Eleonore Chiquito, MD Chief Complaint: Pt c/o gas, bloating and diarrhea, was prescribed Metronidazole and pt stated it made him sick and he quit taking the medication. Pt has also had recent weight loss History of Present Illness:   He tried to collect to the stools but they were soft and he says he cannot pour the stols because they are soft andnot able to pour. 5 stools in last 24 hours. He had not moved for a couple of days. He had not vomited before taking the metronidazole.  He has significant early satiety and is sleeping alot per wife. Using 3 diphenoxylate-atropine daily  No fever. Feeling weak some dyspnea on exertion   GI Review of Systems    Reports abdominal pain, bloating, nausea, vomiting, and  weight loss.     Location of  Abdominal pain: epigastric area.    Denies acid reflux, belching, chest pain, dysphagia with liquids, dysphagia with solids, heartburn, loss of appetite, vomiting blood, and  weight gain.      Reports change in bowel habits and  diarrhea.     Denies anal fissure, black tarry stools, constipation, diverticulosis, fecal incontinence, heme positive stool, hemorrhoids, irritable bowel syndrome, jaundice, light color stool, liver problems, rectal bleeding, and  rectal pain.    Current Medications (verified): 1)  Propranolol Hcl Cr 160 Mg  Cp24 (Propranolol Hcl) .Marland Kitchen.. 1 Once Daily 2)  Probenecid 500 Mg  Tabs (Probenecid) .Marland Kitchen.. 1 Once Daily 3)  Lisinopril 40 Mg  Tabs (Lisinopril) .Marland Kitchen.. 1 Once Daily 4)  Terazosin Hcl 10 Mg  Caps (Terazosin Hcl) .Marland Kitchen.. 1 Once Daily 5)  Align  Caps (Probiotic Product) .... One Daily 6)  Diphenoxylate-Atropine 2.5-0.025 Mg Tabs (Diphenoxylate-Atropine) .... One Every 6 Hours As Needed For Diarrhea  Allergies (verified): 1)  ! * Metronidazole  Past History:  Past Medical History: Last  updated: 04/22/2009 Colonic polyps, hx of Gout Hypertension BPH Diverticulosis, colon external hemorrhoids Hyperlipidemia  Past Surgical History: Last updated: 04/22/2009 Inguinal herniorrhaphy: '89 '01 colonoscopy 2005, 2010  Family History: Last updated: 04/22/2009 Family History of CAD Male 1st degree relative <60 Family History Kidney disease Father s/p MI Mother -Kidney Ca   father died age 52, MI mother died at  37, renal cancer  One brother-age 103 l&w  Social History: Last updated: 10/22/2010 Never Smoked Married  son and daughter -  5 grandchildren Occupation: Semi-retired, bus Emergency planning/management officer tours Alcohol Use - no Daily Caffeine Use Illicit Drug Use - no  Social History: Never Smoked Married  son and daughter -  5 grandchildren Occupation: Semi-retired, bus Emergency planning/management officer tours Alcohol Use - no Daily Caffeine Use Illicit Drug Use - no  Vital Signs:  Patient profile:   70 year old male Height:      73 inches Weight:      184 pounds BMI:     24.36 BSA:     2.08 Pulse rate:   64 / minute Pulse rhythm:   regular BP sitting:   120 / 62  (left arm)  Vitals Entered By: Merri Ray CMA (AAMA) (October 22, 2010 9:42 AM)  Physical Exam  General:  mildly ill, slightly gaunt Eyes:  anicteric Lungs:  Clear throughout to auscultation. Heart:  Regular rate and rhythm; no murmurs, rubs,  or bruits. Abdomen:  soft and nontender BS+ no masses Rectal:  loose yellow-brown stool no mass  perianal erythema +external hemorrhoid/tag Skin:  slightly decreased turgor   Impression & Recommendations:  Problem # 1:  DIARRHEA (ICD-787.91) Assessment Deteriorated I saw him last week for this and tried metronidazole but it made him vomit. He has not collected stool studies. He is about the same re: diarrhea but overall worse. Some of the sxs raise ? of partial obstruction of intestines. If todays workup unrevealing  he will need to hold diphenoxylate  and atropine  and get stool studies in next week. He needs to force fluids.    Orders: CT Abdomen/Pelvis with Contrast (CT Abd/Pelvis w/con) TLB-CMP (Comprehensive Metabolic Pnl) (80053-COMP)  Problem # 2:  WEIGHT LOSS, RECENT (ICD-783.21) Assessment: Deteriorated Appears mostly due to dehydration Orders: TLB-CMP (Comprehensive Metabolic Pnl) (80053-COMP) CT Abdomen/Pelvis with Contrast (CT Abd/Pelvis w/con)  Problem # 3:  NAUSEA WITH VOMITING (ICD-787.01) Assessment: New likely metronidazole ? obstructive issue  Orders: TLB-CMP (Comprehensive Metabolic Pnl) (80053-COMP) CT Abdomen/Pelvis with Contrast (CT Abd/Pelvis w/con)  Patient Instructions: 1)  Please go directly to the basement to have your labs drawn.  2)  Please force small amounts of clear liquids throughout the day. 3)  You have been scheduled for a CT abd/ pelvis today at 2:30pm. 4)  The medication list was reviewed and reconciled.  All changed / newly prescribed medications were explained.  A complete medication list was provided to the patient / caregiver.  Appended Document: Orders Update    Clinical Lists Changes  Orders: Added new Test order of CT Abdomen/Pelvis w & w/o Contrast (CT A/P w&w/o Cont) - Signed

## 2010-11-18 NOTE — Procedures (Signed)
Summary: Colonoscopy  Patient: Jose Hood Note: All result statuses are Final unless otherwise noted.  Tests: (1) Colonoscopy (COL)   COL Colonoscopy           DONE     [facility]     [address] [place], [place]  [address]           COLONOSCOPY PROCEDURE REPORT           PATIENT:  Jose Hood, Jose Hood  MR#:  [id number]     BIRTHDATE:  24-May-1941, 69 yrs. old  GENDER:  male     ENDOSCOPIST:  Iva Boop, MD, [facility]           PROCEDURE DATE:  10/26/2010     PROCEDURE:  Colonoscopy with biopsy     ASA CLASS:  Class II     INDICATIONS:  unexplained diarrhea lactoferrin +     C diff PCR, O     MEDICATIONS:   Fentanyl 75 mcg, Versed 6 mg           DESCRIPTION OF PROCEDURE:   After the risks benefits and     alternatives of the procedure were thoroughly explained, informed     consent was obtained.  Digital rectal exam was performed and     revealed no rectal masses and an enlarged prostate.   The pentax     colo [id number] endoscope was introduced through the anus and advanced     to the terminal ileum which was intubated for a short distance,     without limitations.  The quality of the prep was adequate, using     MoviPrep.  The instrument was then slowly withdrawn as the colon     was fully examined. withdrawal: 11 minutes     <<PROCEDUREIMAGES>>           FINDINGS:  Moderate diverticulosis was found.  This was otherwise     a normal examination of the colon and terminal ileum. Multiple     biopsies were obtained and sent to pathology.   Retroflexed views     in the rectum revealed internal hemorrhoids.    The scope was then     withdrawn from the patient and the procedure completed.           COMPLICATIONS:  None     ENDOSCOPIC IMPRESSION:     1) Moderate diverticulosis     2) Internal hemorrhoids     3) Otherwise normal examination - random biopsies of colon     taken.     RECOMMENDATIONS:     Hopefully home today - will notify re: biopsy results and     further  treatment plans           Iva Boop, MD, [facility]           n.     eSIGNED:   Iva Boop at 10/26/2010 11:02 AM           [place], [place], [id number]  Note: An exclamation mark (!) indicates a result that was not dispersed into the flowsheet. Document Creation Date: 10/26/2010 11:03 AM _______________________________________________________________________  (1) Order result status: Final Collection or observation date-time: 10/26/2010 10:47 Requested date-time:  Receipt date-time:  Reported date-time:  Referring Physician:   Ordering Physician: Stan Head [phone]) Specimen Source:  Source: Launa Grill Order Number: [id number] Lab site:

## 2010-11-18 NOTE — Progress Notes (Signed)
Summary: triage  Phone Note Call from Patient Call back at Home Phone [phone]   Caller: Jose Hood Call For: Dr Leone Payor Reason for Call: Talk to Nurse Summary of Call: Patient wants to speak tio nurse, states that he was seen a couple weeks ago and he is still not better Initial call taken by: Tawni Levy,  October 19, 2010 8:30 AM  Follow-up for Phone Call        I spoke with the patient's wife.  She reports that all diarrhea has resolved.  He does have continued gas and bloating and not able to tolerate meals.  Wife reports that when he takes metronidazole it makes him vomit.  His wife is not sure how many days he took.  I have given him an REV for this Friday.  Dr Leone Payor do you have anything to add or orders until Friday.  Your note says will need flex if still having diarrhea, but diarrhea has resolved. Follow-up by: Darcey Nora RN, CGRN,  October 19, 2010 10:12 AM  Additional Follow-up for Phone Call Additional follow up Details #1::        stop metronidazole await stool studies low-residue diet without meat keep REV  Additional Follow-up by: Iva Boop MD, [facility],  October 19, 2010 10:43 AM    Additional Follow-up for Phone Call Additional follow up Details #2::    wife advised of Dr Marvell Fuller recommendations. Follow-up by: Darcey Nora RN, CGRN,  October 19, 2010 11:51 AM

## 2010-11-18 NOTE — Progress Notes (Signed)
Summary: Triage  Phone Note Call from Patient Call back at Home Phone [phone]   Caller: Patients wife Call For: Dr. Leone Payor Reason for Call: Talk to Nurse Summary of Call: Pt has had diarrhea for two weeks now everytime he eats or drinks anything he has to run to the bathroom, wants to be seen as soon as possible Initial call taken by: [place] Johnson,  October 04, 2010 8:23 AM  Follow-up for Phone Call        I spoke with the patient this am.  He reports one week of severe diarrhea.  He was seen by Dr Amador Cunas and given lomotil this does not help.  He reports 7-8 loose watery stools yesterday.  Denies any recent travel or antibiotic use.  He will come in 10-05-10 11;00 to see Willette Cluster RNP  Follow-up by: Darcey Nora RN, CGRN,  October 04, 2010 10:42 AM

## 2010-11-18 NOTE — Assessment & Plan Note (Signed)
Summary: diarrhea/dm  And a  Vital Signs:  Patient profile:   70 year old male Weight:      197 pounds Temp:     98.3 degrees F oral BP sitting:   130 / 80  (left arm) Cuff size:   regular  Vitals Entered By: Duard Brady LPN (October 07, 2010 2:24 PM) CC: c/o diarrhea x 2 wks - nausea , no fever and no vomiting Is Patient Diabetic? No   CC:  c/o diarrhea x 2 wks - nausea  and no fever and no vomiting.  History of Present Illness: 70 -year-old patient who was seen here 9 days ago with the chief complaint of diarrhea for 3 or to 4 days duration.  He was here with a probiotic and symptomatically with antidiarrheals.  He continues to have frequent diarrhea and has not improved.  He denies any nausea or vomiting, but has lost weight and has ongoing diarrhea.  Denies any fever or chills.  No pertinent travel history.  No one in the household has had similar symptoms.  Allergies (verified): No Known Drug Allergies  Past History:  Past Medical History: Reviewed history from 04/22/2009 and no changes required. Colonic polyps, hx of Gout Hypertension BPH Diverticulosis, colon external hemorrhoids Hyperlipidemia  Review of Systems       The patient complains of anorexia and weight loss.  The patient denies fever, weight gain, vision loss, decreased hearing, hoarseness, chest pain, syncope, dyspnea on exertion, peripheral edema, prolonged cough, headaches, hemoptysis, abdominal pain, melena, hematochezia, severe indigestion/heartburn, hematuria, incontinence, genital sores, muscle weakness, suspicious skin lesions, transient blindness, difficulty walking, depression, unusual weight change, abnormal bleeding, enlarged lymph nodes, angioedema, breast masses, and testicular masses.    Physical Exam  General:  Well-developed,well-nourished,in no acute distress; alert,appropriate and cooperative throughout examination Head:  Normocephalic and atraumatic without obvious  abnormalities. No apparent alopecia or balding. Eyes:  No corneal or conjunctival inflammation noted. EOMI. Perrla. Funduscopic exam benign, without hemorrhages, exudates or papilledema. Vision grossly normal. Mouth:  Oral mucosa and oropharynx without lesions or exudates.  Teeth in good repair. Neck:  No deformities, masses, or tenderness noted. Lungs:  Normal respiratory effort, chest expands symmetrically. Lungs are clear to auscultation, no crackles or wheezes. Heart:  Normal rate and regular rhythm. S1 and S2 normal without gallop, murmur, click, rub or other extra sounds. Abdomen:  Bowel sounds positive,abdomen soft and non-tender without masses, organomegaly or hernias noted.   Impression & Recommendations:  Problem # 1:  DIARRHEA (ICD-787.91)  His updated medication list for this problem includes:    Align Caps (Probiotic product) ..... One daily    Diphenoxylate-atropine 2.5-0.025 Mg Tabs (Diphenoxylate-atropine) ..... One every 6 hours for diarrhea will check lab and empirically place  on Cipro due to the severity and the chronicity of the diarrhea    His updated medication list for this problem includes:    Align Caps (Probiotic product) ..... One daily    Diphenoxylate-atropine 2.5-0.025 Mg Tabs (Diphenoxylate-atropine) ..... One every 6 hours for diarrhea  Orders: Venipuncture ([address]) TLB-CBC Platelet - w/Differential (85025-CBCD) TLB-BMP (Basic Metabolic Panel-BMET) (80048-METABOL) Specimen Handling ([address])  Problem # 2:  HYPERTENSION (ICD-401.9)  His updated medication list for this problem includes:    Propranolol Hcl Cr 160 Mg Cp24 (Propranolol hcl) .Marland Kitchen... 1 once daily    Lisinopril 40 Mg Tabs (Lisinopril) .Marland Kitchen... 1 once daily    Terazosin Hcl 10 Mg Caps (Terazosin hcl) .Marland Kitchen... 1 once daily  His updated medication list for  this problem includes:    Propranolol Hcl Cr 160 Mg Cp24 (Propranolol hcl) .Marland Kitchen... 1 once daily    Lisinopril 40 Mg Tabs (Lisinopril) .Marland Kitchen... 1 once  daily    Terazosin Hcl 10 Mg Caps (Terazosin hcl) .Marland Kitchen... 1 once daily  Complete Medication List: 1)  Propranolol Hcl Cr 160 Mg Cp24 (Propranolol hcl) .Marland Kitchen.. 1 once daily 2)  Probenecid 500 Mg Tabs (Probenecid) .Marland Kitchen.. 1 once daily 3)  Lisinopril 40 Mg Tabs (Lisinopril) .Marland Kitchen.. 1 once daily 4)  Terazosin Hcl 10 Mg Caps (Terazosin hcl) .Marland Kitchen.. 1 once daily 5)  Align Caps (Probiotic product) .... One daily 6)  Diphenoxylate-atropine 2.5-0.025 Mg Tabs (Diphenoxylate-atropine) .... One every 6 hours for diarrhea 7)  Ciprofloxacin Hcl 500 Mg Tabs (Ciprofloxacin hcl) .... One twice daily  Patient Instructions: 1)  Drink as much fluid as you can tolerate for the next few days. 2)  Take your antibiotic as prescribed until ALL of it is gone, but stop if you develop a rash or swelling and contact our office as soon as possible. Prescriptions: DIPHENOXYLATE-ATROPINE 2.5-0.025 MG TABS (DIPHENOXYLATE-ATROPINE) one every 6 hours for diarrhea  #20 x 2   Entered and Authorized by:   Gordy Savers  MD   Signed by:   Gordy Savers  MD on 10/07/2010   Method used:   Print then Give to Patient   RxID:   [id number] CIPROFLOXACIN HCL 500 MG TABS (CIPROFLOXACIN HCL) one twice daily  #14 x 0   Entered and Authorized by:   Gordy Savers  MD   Signed by:   Gordy Savers  MD on 10/07/2010   Method used:   Print then Give to Patient   RxID:   [id number]    Orders Added: 1)  Est. Patient Level III [[address]] 2)  Venipuncture [[address]] 3)  TLB-CBC Platelet - w/Differential [85025-CBCD] 4)  TLB-BMP (Basic Metabolic Panel-BMET) [80048-METABOL] 5)  Specimen Handling [99[address]]

## 2010-11-18 NOTE — Assessment & Plan Note (Signed)
Summary: diarrhea/Jose Hood   History of Present Illness Visit Type: Initial Visit Primary GI MD: Stan Head MD [facility] Primary Provider: Eleonore Chiquito, MD Chief Complaint: diarrhea x 3 weeks History of Present Illness:   70 yo wm with 3+ weeks of diarrhea  He has seen Dr. Amador Cunas twice. He called [place] and was briefly better and then it returned. Stools are watery, and have been dark. Problems begin after lunch mostly with 4-5 stools in afternoon and evening. Only 1 noctural disturbance with mld incontinence. The change to diarrhea was relatively sudden. He has been treated with cipro and Align as well as Lomotil. Using Lomotil three times a day. No recent antibiotics. 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 Previously he went to [place], [place] on a trip. He was not sick until after these trips. No known contacts. He is weak. He has felt cold but no fever reported.     GI Review of Systems    Reports abdominal pain, belching, bloating, loss of appetite, and  weight loss.     Location of  Abdominal pain: epigastric area. Weight loss of 20 pounds over 3 weeks.   Denies acid reflux, chest pain, dysphagia with liquids, dysphagia with solids, heartburn, nausea, vomiting, vomiting blood, and  weight gain.      Reports diarrhea.     Denies anal fissure, black tarry stools, change in bowel habit, constipation, diverticulosis, fecal incontinence, heme positive stool, hemorrhoids, irritable bowel syndrome, jaundice, light color stool, liver problems, rectal bleeding, and  rectal pain. Preventive Screening-Counseling & Management      Drug Use:  no.      Clinical Reports Reviewed:  Colonoscopy:  12/23/2008:  1) 1 - 2 mm diminutive polyp in the cecum, removed. HYPERPLASTIC  2) Moderate diverticulosis in the sigmoid colon 3) Internal hemorrhoids, small. 4) Otherwise normal examination, prior diminutive adenoma 2005. 5) Fleshy subcutaneous  nodule left buttock....will ask for GSU evaluation as it bothers him.   12/24/2003:  Location:  [facility].    Current Medications (verified): 1)  Propranolol Hcl Cr 160 Mg  Cp24 (Propranolol Hcl) .Marland Kitchen.. 1 Once Daily 2)  Probenecid 500 Mg  Tabs (Probenecid) .Marland Kitchen.. 1 Once Daily 3)  Lisinopril 40 Mg  Tabs (Lisinopril) .Marland Kitchen.. 1 Once Daily 4)  Terazosin Hcl 10 Mg  Caps (Terazosin Hcl) .Marland Kitchen.. 1 Once Daily 5)  Align  Caps (Probiotic Product) .... One Daily 6)  Diphenoxylate-Atropine 2.5-0.025 Mg Tabs (Diphenoxylate-Atropine) .... One Every 6 Hours For Diarrhea 7)  Ciprofloxacin Hcl 500 Mg Tabs (Ciprofloxacin Hcl) .... One Twice Daily  Allergies (verified): No Known Drug Allergies  Past History:  Past Medical History: Reviewed history from 04/22/2009 and no changes required. Colonic polyps, hx of Gout Hypertension BPH Diverticulosis, colon external hemorrhoids Hyperlipidemia  Past Surgical History: Reviewed history from 04/22/2009 and no changes required. Inguinal herniorrhaphy: '89 '01 colonoscopy 2005, 2010  Social History: Never Smoked Married  son and daughter -  5 grandchildren Occupation: Retired Alcohol Use - no Daily Caffeine Use Illicit Drug Use - no Drug Use:  no  Review of Systems  The patient denies allergy/sinus, anemia, anxiety-new, arthritis/joint pain, back pain, blood in urine, breast changes/lumps, change in vision, confusion, cough, coughing up blood, depression-new, fainting, fatigue, fever, headaches-new, hearing problems, heart murmur, heart rhythm changes, itching, menstrual pain, muscle pains/cramps, night sweats, nosebleeds, pregnancy symptoms, shortness of breath, skin rash, sleeping problems, sore throat, swelling of feet/legs, swollen lymph glands, thirst -  excessive , urination - excessive , urination changes/pain, urine leakage, vision changes, and voice change.    Vital Signs:  Patient profile:   70 year old male Height:      73  inches Weight:      192.50 pounds BMI:     25.49 Pulse rate:   84 / minute Pulse rhythm:   regular BP sitting:   120 / 78  (left arm) Cuff size:   regular  Vitals Entered By: June McMurray CMA Duncan Dull) (October 13, 2010 1:59 PM)  Physical Exam  General:  mildly ill  Mouth:  coated tongue Lungs:  Clear throughout to auscultation. Heart:  Regular rate and rhythm; no murmurs, rubs,  or bruits. Abdomen:  soft and nontender BS+ no masses Psych:  Alert and cooperative. Normal mood and affect.   Impression & Recommendations:  Problem # 1:  DIARRHEA (ICD-787.91) Assessment Deteriorated I am suspecting that this is infectious as most likely. Has not responded to cipro. Will check stool studies and cover with metronidazole. he is not toxic so will continue Lomotil also. Force fluids. If not better and studies unrevelaing anticipate flex sig or colonoscopy and may not need a prep.  Orders: T-Stool for O&P ([id number]) T-Fecal WBC ([id number]) T-C diff by PCR ([id number]) T-Culture, Stool ([id number]) TLB-CBC Platelet - w/Differential (85025-CBCD) TLB-BMP (Basic Metabolic Panel-BMET) (80048-METABOL)  Problem # 2:  WEIGHT LOSS, RECENT (ICD-783.21) Assessment: New Seems due to diarrheal illness. He is to force fluids, labs so far ok and will recheck.  Patient Instructions: 1)  Your physician requests that you go to the basement floor of our office to have the following labwork completed before leaving today: CBC, BMET, Stool for C Diff by PCR, Stool for O & P, Stool Culture, Stool for WBC. 2)  Please pick up your prescriptions at the pharmacy. Electronic prescription(s) has already been sent for Metronidazole. 3)  We have given you a handwritten prescription for diphenoxylate for you to take to the pharmacy. 4)  Recommend increasing fluid intake for hydration for the next few days.  5)  The medication list was reviewed and reconciled.  All changed / newly prescribed medications  were explained.  A complete medication list was provided to the patient / caregiver. Prescriptions: METRONIDAZOLE 500 MG TABS (METRONIDAZOLE) 1 by mouth three times a day for 10 days  #30 x 0   Entered and Authorized by:   Iva Boop MD, [facility]   Signed by:   Iva Boop MD, FACG on 10/13/2010   Method used:   [facility]  [address] [id number]       [address]       [place], [place]  [address]       Ph: [phone] or [phone]       Fax: [phone]   RxID:   [id number] DIPHENOXYLATE-ATROPINE 2.5-0.025 MG TABS (DIPHENOXYLATE-ATROPINE) one every 6 hours as needed for diarrhea  #45 x 0   Entered and Authorized by:   Iva Boop MD, [facility]   Signed by:   Iva Boop MD, [facility] on 10/13/2010   Method used:   Printed then faxed to ...       [facility]  [address] [id number]       [address]       [place], [place]  [address]  Ph: [phone] or [phone]       Fax: [phone]   RxID:   [id number]

## 2010-11-18 NOTE — Progress Notes (Signed)
Summary: Lymphocytic colitis, start Entocort, f/u 6 wks  Phone Note Outgoing Call   Summary of Call: let him know he has a microscopic colitis as seen on colon biopsies. Not an infection, it may be a reaction to a medication or it just happens and we don't know why. It usually responds to steroids and sometime gets better on its own. I have prescribed Entocort EC for this. To take 3/day for 1 month then go to 1 a day after that. Follow-up me in 6 weeks It is still ok to take loperamide as needed but I expect this medication to remove that need. If he is not using diphenoxylate/atropine anymore take it off the list, please Iva Boop MD, [facility]  October 29, 2010 6:10 AM   Follow-up for Phone Call        patient and wife have been notified of results and Dr Radene Journey recommendations. They will call back for any questions.  REV scheduled for 12/07/10 Follow-up by: Darcey Nora RN, CGRN,  October 29, 2010 11:03 AM    New/Updated Medications: ENTOCORT EC 3 MG  CP24 (BUDESONIDE) Take 3 capsules daily for 1 month then 1 capsule daily until told to stop by Dr. Leone Payor Prescriptions: ENTOCORT EC 3 MG  CP24 (BUDESONIDE) Take 3 capsules daily for 1 month then 1 capsule daily until told to stop by Dr. Leone Payor  #90 x 1   Entered and Authorized by:   Iva Boop MD, [facility]   Signed by:   Iva Boop MD, [facility] on 10/29/2010   Method used:   [facility]        [facility]  [address] [id number]       [address]       [place], [place]  [address]       Ph: [phone] or [phone]       Fax: [phone]   RxID:   [id number]

## 2010-11-18 NOTE — Letter (Addendum)
Summary: [facility]  [facility]   Imported By: Maryln Gottron 11/11/2010 10:53:39  _____________________________________________________________________  External Attachment:    Type:   Image     Comment:   External Document

## 2010-11-18 NOTE — Assessment & Plan Note (Signed)
Summary: diarrhea//ccm   Vital Signs:  Patient profile:   70 year old male Weight:      210 pounds Temp:     98.0 degrees F oral BP sitting:   120 / 80  (left arm) Cuff size:   regular  Vitals Entered By: Duard Brady LPN (September 28, 2010 9:50 AM) CC: c/o diahrrea pain in stomach  Is Patient Diabetic? No   CC:  c/o diahrrea pain in stomach .  History of Present Illness:  70 year old patient who is seen today for follow-up.  For the past 4 days.  She has had some diarrhea.  He is felt unwell, but over the past day or two has improved.  He has been using some Pepto-Bismol and was concerned about some black stool.  He has treated hypertension, dyslipidemia, and a history of gout.  He does have osteoarthritis and does take Advil p.r.n.he denies any chest pain or shortness of breath.  Denies any abdominal pain.  No prior history of GI bleeding.  He has been using Imodium with some benefit as well  Allergies (verified): No Known Drug Allergies  Past History:  Past Medical History: Reviewed history from 04/22/2009 and no changes required. Colonic polyps, hx of Gout Hypertension BPH Diverticulosis, colon external hemorrhoids Hyperlipidemia  Past Surgical History: Reviewed history from 04/22/2009 and no changes required. Inguinal herniorrhaphy: '89 '01 colonoscopy 2005, 2010  Family History: Reviewed history from 04/22/2009 and no changes required. Family History of CAD Male 1st degree relative <60 Family History Kidney disease Father s/p MI Mother -Kidney Ca   father died age 71, MI mother died at  81, renal cancer  One brother-age 49 l&w  Social History: Reviewed history from 04/22/2009 and no changes required. Never Smoked Married  son and daughter -  5 grandchildren  Review of Systems       The patient complains of anorexia and melena.  The patient denies fever, weight loss, weight gain, vision loss, decreased hearing, hoarseness, chest pain,  syncope, dyspnea on exertion, peripheral edema, prolonged cough, headaches, hemoptysis, abdominal pain, hematochezia, severe indigestion/heartburn, hematuria, incontinence, genital sores, muscle weakness, suspicious skin lesions, transient blindness, difficulty walking, depression, unusual weight change, abnormal bleeding, enlarged lymph nodes, angioedema, breast masses, and testicular masses.    Physical Exam  General:  Well-developed,well-nourished,in no acute distress; alert,appropriate and cooperative throughout examination Head:  Normocephalic and atraumatic without obvious abnormalities. No apparent alopecia or balding. Eyes:  No corneal or conjunctival inflammation noted. EOMI. Perrla. Funduscopic exam benign, without hemorrhages, exudates or papilledema. Vision grossly normal. Mouth:  Oral mucosa and oropharynx without lesions or exudates.  Teeth in good repair. Neck:  No deformities, masses, or tenderness noted. Lungs:  Normal respiratory effort, chest expands symmetrically. Lungs are clear to auscultation, no crackles or wheezes. Heart:  Normal rate and regular rhythm. S1 and S2 normal without gallop, murmur, click, rub or other extra sounds. Abdomen:  Bowel sounds positive,abdomen soft and non-tender without masses, organomegaly or hernias noted. Rectal:  No external abnormalities noted. Normal sphincter tone. No rectal masses or tenderness. stool is dark, brown and hematest negative Prostate:  3+ enlarged.  3+ enlarged.   Msk:  No deformity or scoliosis noted of thoracic or lumbar spine.   Extremities:  No clubbing, cyanosis, edema, or deformity noted with normal full range of motion of all joints.   Skin:  Intact without suspicious lesions or rashes Cervical Nodes:  No lymphadenopathy noted Psych:  Cognition and judgment appear intact. Alert and cooperative  with normal attention span and concentration. No apparent delusions, illusions, hallucinations   Impression &  Recommendations:  Problem # 1:  DIARRHEA (ICD-787.91)  believed that  the dark stool is related to Pepto-Bismol.  No evidence of GI bleeding  His updated medication list for this problem includes:    Align Caps (Probiotic product) ..... One daily    Diphenoxylate-atropine 2.5-0.025 Mg Tabs (Diphenoxylate-atropine) ..... One every 6 hours for diarrhea  His updated medication list for this problem includes:    Align Caps (Probiotic product) ..... One daily    Diphenoxylate-atropine 2.5-0.025 Mg Tabs (Diphenoxylate-atropine) ..... One every 6 hours for diarrhea  Problem # 2:  OSTEOARTHRITIS (ICD-715.90)  Problem # 3:  HYPERTENSION (ICD-401.9)  His updated medication list for this problem includes:    Propranolol Hcl Cr 160 Mg Cp24 (Propranolol hcl) .Marland Kitchen... 1 once daily    Lisinopril 40 Mg Tabs (Lisinopril) .Marland Kitchen... 1 once daily    Terazosin Hcl 10 Mg Caps (Terazosin hcl) .Marland Kitchen... 1 once daily  His updated medication list for this problem includes:    Propranolol Hcl Cr 160 Mg Cp24 (Propranolol hcl) .Marland Kitchen... 1 once daily    Lisinopril 40 Mg Tabs (Lisinopril) .Marland Kitchen... 1 once daily    Terazosin Hcl 10 Mg Caps (Terazosin hcl) .Marland Kitchen... 1 once daily  Complete Medication List: 1)  Propranolol Hcl Cr 160 Mg Cp24 (Propranolol hcl) .Marland Kitchen.. 1 once daily 2)  Probenecid 500 Mg Tabs (Probenecid) .Marland Kitchen.. 1 once daily 3)  Lisinopril 40 Mg Tabs (Lisinopril) .Marland Kitchen.. 1 once daily 4)  Terazosin Hcl 10 Mg Caps (Terazosin hcl) .Marland Kitchen.. 1 once daily 5)  Align Caps (Probiotic product) .... One daily 6)  Diphenoxylate-atropine 2.5-0.025 Mg Tabs (Diphenoxylate-atropine) .... One every 6 hours for diarrhea  Patient Instructions: 1)  Please schedule a follow-up appointment in late July for CPX 2)  Limit your Sodium (Salt). 3)  It is important that you exercise regularly at least 20 minutes 5 times a week. If you develop chest pain, have severe difficulty breathing, or feel very tired , stop exercising immediately and seek medical  attention. Prescriptions: DIPHENOXYLATE-ATROPINE 2.5-0.025 MG TABS (DIPHENOXYLATE-ATROPINE) one every 6 hours for diarrhea  #20 x 0   Entered and Authorized by:   Gordy Savers  MD   Signed by:   Gordy Savers  MD on 09/28/2010   Method used:   Print then Give to Patient   RxID:   [id number] TERAZOSIN HCL 10 MG  CAPS (TERAZOSIN HCL) 1 once daily  #90 x 6   Entered and Authorized by:   Gordy Savers  MD   Signed by:   Gordy Savers  MD on 09/28/2010   Method used:   [facility]  [address] [id number]       [address]       [place], [place]  [address]       Ph: [phone] or [phone]       Fax: [phone]   RxID:   [id number] LISINOPRIL 40 MG  TABS (LISINOPRIL) 1 once daily  #90 x 6   Entered and Authorized by:   Gordy Savers  MD   Signed by:   Gordy Savers  MD on 09/28/2010   Method used:   [facility]        [facility]  [address] [id number]       [address]  [place], [place]  [address]       Ph: [phone] or [phone]       Fax: [phone]   RxID:   [id number] PROBENECID 500 MG  TABS (PROBENECID) 1 once daily  #90 x 6   Entered and Authorized by:   Gordy Savers  MD   Signed by:   Gordy Savers  MD on 09/28/2010   Method used:   [facility]        [facility]  [address] [id number]       [address]       [place], [place]  [address]       Ph: [phone] or [phone]       Fax: [phone]   RxID:   [id number] PROPRANOLOL HCL CR 160 MG  CP24 (PROPRANOLOL HCL) 1 once daily  #90 x 6   Entered and Authorized by:   Gordy Savers  MD   Signed by:   Gordy Savers  MD on 09/28/2010   Method used:   [facility]        [facility]  [address] [id number]       [address]       [place], [place]  [address]       Ph: [phone] or [phone]        Fax: [phone]   RxID:   [id number]    Orders Added: 1)  Est. Patient Level IV [82423]

## 2010-11-18 NOTE — Progress Notes (Signed)
Summary: Triage  Phone Note Call from Patient Call back at Home Phone [phone]   Caller: Patient Call For: Dr. Leone Payor Reason for Call: Talk to Nurse Summary of Call: Pts wife is calling because her husband is having a problem that she wants to discuss wtih the nurse Initial call taken by: [place] Johnson,  October 28, 2010 8:16 AM  Follow-up for Phone Call        Spoke with patient's wife. He thinks the Diphenoxylate-atropine is making him nausated. He took one last night and vomited x 1. He has not taken it this AM and has no nausea. Wife states patient has not had any diarrhea. He had a formed stool last PM. She wants to know if he can stop the Diphenoxylate-atropine that he is taking three times a day. Please, advise. Follow-up by: Jesse Fall RN,  October 28, 2010 9:57 AM  Additional Follow-up for Phone Call Additional follow up Details #1::        diphenoxylate-atropine is really a as needed med if he gets diarrhea again - try loperamide (Immodium) AD as written on package colon biopsies not yet back we will call with those when they are in  Additional Follow-up by: Iva Boop MD, [facility],  October 28, 2010 10:21 AM    Additional Follow-up for Phone Call Additional follow up Details #2::    Patient's wife given Dr. Marvell Fuller recommendations.  Follow-up by: Jesse Fall RN,  October 28, 2010 10:31 AM

## 2010-11-18 NOTE — Progress Notes (Signed)
Summary: Triage  Phone Note Call from Patient Call back at Home Phone [phone]   Caller: Patients wife Call For: Dr. Leone Payor Reason for Call: Talk to Nurse Summary of Call: Pts wife is calling because husband has had diarrhea for 3 weeks, seen primary doc and nothing has helped, says he is getting weak now and demands that he be seen soon Initial call taken by: [place] Johnson,  October 12, 2010 8:07 AM  Follow-up for Phone Call        Patient c/o continued diarrhea.  He says his diarrhea had improved and that is why he canceled the appointment from 10/05/10.  Patient states diarrhea has returned.  I have scheduled him to see Dr Elzie Rings tomorrow at 2:15.  I have asked him to keep the appointment even if his diarrhea stops today.  Patient verbalized understanding. Follow-up by: Darcey Nora RN, CGRN,  October 12, 2010 9:37 AM

## 2010-11-29 ENCOUNTER — Telehealth: Payer: Self-pay | Admitting: Internal Medicine

## 2010-12-07 ENCOUNTER — Encounter: Payer: Self-pay | Admitting: Internal Medicine

## 2010-12-07 ENCOUNTER — Ambulatory Visit (INDEPENDENT_AMBULATORY_CARE_PROVIDER_SITE_OTHER): Payer: Medicare Other | Admitting: Internal Medicine

## 2010-12-07 DIAGNOSIS — K5289 Other specified noninfective gastroenteritis and colitis: Secondary | ICD-10-CM

## 2010-12-08 NOTE — Progress Notes (Signed)
Summary: Medication  Phone Note Call from Patient Call back at Home Phone [phone]   Caller: Patient Call For: Dr. Leone Payor Reason for Call: Talk to Nurse Summary of Call: Pt wife is calling to see if her husband needs a refill on the  budesonide steroid, she is not sure if he is supposed to continue this medication Initial call taken by: [place] Johnson,  November 29, 2010 8:13 AM  Follow-up for Phone Call        P t. took his last dose of Entocort today.Diarrhea has completely cleared.Having soft formed brown stools.has not seen any blood or mucus. Told  the med. was only ordered for this amt. of time but if diarrhea re-occurs before o.v. on 12/07/10 to please call back. Follow-up by: Teryl Lucy RN,  November 29, 2010 8:38 AM  Additional Follow-up for Phone Call Additional follow up Details #1::        no as in last phone note after first rx is to go to 3 mg a day (1 capsule) until he sees me - if this is extremely expensive for him we may be able to stop here but I usually treat 6-8 weeks in this fashion Iva Boop MD, [facility]  November 29, 2010 9:03 AM     Additional Follow-up for Phone Call Additional follow up Details #2::    Wife says since pt. is doing o.k. and o.v. is just a week away they will hold re-ordering daily but will call back if loose stools re-occur. Follow-up by: Teryl Lucy RN,  November 29, 2010 10:15 AM  Additional Follow-up for Phone Call Additional follow up Details #3:: Details for Additional Follow-up Action Taken: ok Additional Follow-up by: Iva Boop MD, [facility],  November 29, 2010 10:19 AM

## 2010-12-14 NOTE — Assessment & Plan Note (Signed)
Summary: F/u from colon    History of Present Illness Visit Type: Follow-up Visit Primary GI MD: Stan Head MD  [facility] Primary Provider: Eleonore Chiquito, MD Requesting Provider: na Chief Complaint: diarrhea  History of Present Illness:   70 yo wm  with a recent diagnosis of lymphocytic colitis. He was treated with budesonide at 9 mg daily for one month. he has no diarrhea or other symptoms of his colitis. He has resumed normal eating for the most part. He is regaining weight.   GI Review of Systems      Denies abdominal pain, acid reflux, belching, bloating, chest pain, dysphagia with liquids, dysphagia with solids, heartburn, loss of appetite, nausea, vomiting, vomiting blood, weight loss, and  weight gain.        Denies anal fissure, black tarry stools, change in bowel habit, constipation, diarrhea, diverticulosis, fecal incontinence, heme positive stool, hemorrhoids, irritable bowel syndrome, jaundice, light color stool, liver problems, rectal bleeding, and  rectal pain.    Current Medications (verified): 1)  Propranolol Hcl Cr 160 Mg  Cp24 (Propranolol Hcl) .Marland Kitchen.. 1 Once Daily 2)  Probenecid 500 Mg  Tabs (Probenecid) .Marland Kitchen.. 1 Once Daily 3)  Lisinopril 40 Mg  Tabs (Lisinopril) .Marland Kitchen.. 1 Once Daily 4)  Terazosin Hcl 10 Mg  Caps (Terazosin Hcl) .Marland Kitchen.. 1 Once Daily 5)  Flomax 0.4 Mg Caps (Tamsulosin Hcl) .... One Tablet By Mouth Once Daily  Allergies (verified): 1)  ! * Metronidazole  Past History:  Past Medical History: Colonic polyps, hx of Gout Hypertension BPH Diverticulosis, colon External hemorrhoids Hyperlipidemia  Past Surgical History: Inguinal herniorrhaphy: '89 '01 colonoscopy 2005, 2010, 2012  Family History: Family History of CAD Male 1st degree relative <60 Family History Kidney disease Father s/p MI Mother -Kidney Ca father died age 16, MI mother died at  6, renal cancer One brother-age 51 l&w No FH of Colon Cancer:  Social History: Reviewed history  from 10/22/2010 and no changes required. Never Smoked Married  son and daughter -  5 grandchildren Occupation: Semi-retired, bus Emergency planning/management officer tours Alcohol Use - no Daily Caffeine Use Illicit Drug Use - no  Vital Signs:  Patient profile:   70 year old male Height:      73 inches Weight:      188 pounds BMI:     24.89 BSA:     2.10 Pulse rate:   60 / minute Pulse rhythm:   regular BP sitting:   118 / 68  (left arm) Cuff size:   regular  Vitals Entered By: Ok Anis CMA (December 07, 2010 9:36 AM) CC: diarrhea    Physical Exam  General:  Well developed, well nourished, no acute distress.   Impression & Recommendations:  Problem # 1:  COLITIS (ICD-558.9) Assessment Improved  Has lymphocytic colitis appears resolved. Will observe for recurrence, he knows to call if he has recurrent diarrhea at which point unless there were other factors would reinstitute budesonide versus testing for infection depending upon the setting.     I explained that this is usually a self-limited process but can be chronic and recurrent. in looking at his medications I don't see any of the typical offending agents.  Patient Instructions: 1)  Copy sent to : Marcine Matar MD 2)  You may gradually add regular foods back into your diet. 3)  Please call Dr Leone Payor if diarrhea returns. 4)  The medication list was reviewed and reconciled.  All changed / newly prescribed medications were explained.  A complete medication  list was provided to the patient / caregiver.  Appended Document: F/u from colon    Past History:  Past Medical History:    Colonic polyps - diminitive adenoma 2005    Gout    Hypertension    BPH    Urinary retention 10/2010    Hypospadia    Diverticulosis, colon    External hemorrhoids    Hyperlipidemia

## 2010-12-27 LAB — COMPREHENSIVE METABOLIC PANEL
ALT: 35 U/L (ref 0–53)
AST: 37 U/L (ref 0–37)
Albumin: 3.2 g/dL — ABNORMAL LOW (ref 3.5–5.2)
Alkaline Phosphatase: 135 U/L — ABNORMAL HIGH (ref 39–117)
BUN: 16 mg/dL (ref 6–23)
CO2: 25 mEq/L (ref 19–32)
Calcium: 8.8 mg/dL (ref 8.4–10.5)
Chloride: 105 mEq/L (ref 96–112)
Creatinine, Ser: 1.35 mg/dL (ref 0.4–1.5)
GFR calc Af Amer: >60 mL/min (ref >60)
GFR calc non Af Amer: 52 mL/min — ABNORMAL LOW (ref >60)
Glucose, Bld: 100 mg/dL — ABNORMAL HIGH (ref 70–99)
Potassium: 4 mEq/L (ref 3.5–5.1)
Sodium: 137 mEq/L (ref 135–145)
Total Bilirubin: 1.6 mg/dL — ABNORMAL HIGH (ref 0.3–1.2)
Total Protein: 6 g/dL (ref 6.0–8.3)

## 2010-12-27 LAB — CBC
HCT: 47.3 % (ref 39.0–52.0)
Hemoglobin: 15.8 g/dL (ref 13.0–17.0)
MCH: 29.6 pg (ref 26.0–34.0)
MCHC: 33.4 g/dL (ref 30.0–36.0)
MCV: 88.6 fL (ref 78.0–100.0)
Platelets: 186 10*3/uL (ref 150–400)
RBC: 5.34 MIL/uL (ref 4.22–5.81)
RDW: 13.6 % (ref 11.5–15.5)
WBC: 5.4 10*3/uL (ref 4.0–10.5)

## 2010-12-27 LAB — DIFFERENTIAL
Basophils Absolute: 0 10*3/uL (ref 0.0–0.1)
Basophils Relative: 0 % (ref 0–1)
Eosinophils Absolute: 0.1 10*3/uL (ref 0.0–0.7)
Eosinophils Relative: 2 % (ref 0–5)
Lymphocytes Relative: 27 % (ref 12–46)
Lymphs Abs: 1.5 10*3/uL (ref 0.7–4.0)
Monocytes Absolute: 0.5 10*3/uL (ref 0.1–1.0)
Monocytes Relative: 10 % (ref 3–12)
Neutro Abs: 3.3 10*3/uL (ref 1.7–7.7)
Neutrophils Relative %: 61 % (ref 43–77)

## 2011-01-31 ENCOUNTER — Telehealth: Payer: Self-pay | Admitting: Internal Medicine

## 2011-01-31 MED ORDER — TAMSULOSIN HCL 0.4 MG PO CAPS: 0.4000 mg | ORAL_CAPSULE | Freq: Every day | ORAL | Status: DC

## 2011-01-31 MED ORDER — TERAZOSIN HCL 10 MG PO CAPS: 10.0000 mg | ORAL_CAPSULE | Freq: Every day | ORAL | Status: DC

## 2011-01-31 MED ORDER — PROPRANOLOL HCL ER 160 MG PO CP24: 160.0000 mg | ORAL_CAPSULE | Freq: Every day | ORAL | Status: DC

## 2011-01-31 MED ORDER — PROBENECID 500 MG PO TABS: 500.0000 mg | ORAL_TABLET | Freq: Two times a day (BID) | ORAL | Status: DC

## 2011-01-31 MED ORDER — LISINOPRIL 40 MG PO TABS: 40.0000 mg | ORAL_TABLET | Freq: Every day | ORAL | Status: DC

## 2011-01-31 NOTE — Telephone Encounter (Signed)
Pts spouse called and said that Dr Amador Cunas was suppose to send pts med refills to [facility]. Meds were sent incorrectly to [facility]. Pls resed to [facility] Mail order pharmacy.

## 2011-01-31 NOTE — Telephone Encounter (Signed)
Spoke with pt - needs refills on meds - [facility] to [facility] done

## 2011-03-04 NOTE — Op Note (Signed)
[place]  Patient:    Jose Hood, Jose Hood                           MRN: [id number] Proc. Date: 02/08/00 Adm. Date:  [id number] Attending:  Brandy Hale CC:         Valetta Mole. Swords, M.D. [facility]             Gordy Savers, M.D. [facility]                           Operative Report  PREOPERATIVE DIAGNOSIS:  Recurrent left inguinal hernia.  POSTOPERATIVE DIAGNOSIS:  Recurrent left inguinal hernia.  OPERATION:  Laparoscopic preperitoneal repair of recurrent left inguinal hernia  with polypropylene mesh.  SURGEON:  Angelia Mould. Derrell Lolling, M.D.  INDICATIONS:  This is a 70 year old white man who underwent a left inguinal hernia repair by Dr. Currie Paris in 1989.  This was essentially a ring-tightening procedure.  No mesh was used.  He presented recently with a bulge and some pain in his left groin, after lifting heavy objects at work.  On examination he has a reducible left inguinal hernia.  He was symptomatic and wanted to have this repaired.  Dr. Jamey Ripa asked me to repair this laparoscopically, which I think is very reasonable.  He is brought to the operating room electively.  DESCRIPTION OF PROCEDURE:  Following the induction of general endotracheal anesthesia, the patients bladder was emptied with a Foley catheter.  The abdomen was prepped and draped in a sterile fashion.  Marcaine 0.25% with epinephrine was used as a local infiltration anesthetic.  A transverse incision was made just below the umbilicus.  The fascia was incised transversely, exposing the left rectus muscle.  The left rectus muscle was retracted laterally.  We bluntly dissected he space behind the left rectus muscle and the left rectus sheath.  We inserted a balloon dissector in the left rectus sheath in the midline down to the symphysis pubis.  The video camera was inserted and the dissector balloon was insufflated  manually under direct vision.  We had a good  deployment of the balloon bilaterally. The inferior epigastric vessels on the right side stayed attached to the posterior belly of the rectus muscle, but the inferior epigastric vessels on the left side dissected away and posteriorly with the peritoneum and the hernia sac.  The balloon was left inflated for about three minutes, to promote hemostasis.  The balloon as then deflated and removed.  The insufflating trocar was then inserted with this  balloon inflated and secured, and the preperitoneal space was inflated under 12  mmHg pressure.  We had good visualization of the rectus muscles anteriorly, preperitoneal fat posteriorly.  The symphysis pubis in the midline.  We dissected a little bit of fatty tissue away from the symphysis pubis and the left Coopers ligament.  We inspected the direct space, and there was really no direct hernia  recurrence.  We had to secure the inferior epigastric vessels with multiple metal clips, and divide them.  We were then able to dissect some fatty tissue away from the cord structures.  We found that he had an indirect hernia recurrence.  We found the hernia sac and stripped it away from the cord structures, all the way back cephalad to the anterior superior iliac spine.  We dissected all of the peritoneum away from this area laterally  as well, and exposed a good generous area of abdominal wall lateral to the cord structures.  We identified the testicular vessels and the vas deferens and got around these, and were then satisfied that we had completed the dissection.  The repair was accomplished with a 4 inch x 6 inch piece of polypropylene mesh.  The mesh was inserted and deployed transversely. The mesh was positioned so as to extend across the midline slightly, below Coopers ligament slightly, and to extend well laterally to the cord structures.  The mesh was secured with a 5.0 mm tacking device, placing five tacks in the superior rim  of Coopers ligament, and the symphysis pubis.  We then placed tacks up along the midline of the midline fascia, and then transversely across the superior border of the mesh.  As we continued tacking the mesh anteriorly and laterally, we were very careful to palpate the tip of the tacking device through the abdominal wall, so as to avoid placing any fixation device below the iliopubic tract.  The repair appeared quite nice and secure.  We held the posterolateral tails of the mesh in place, and then released the pneumoperitoneum.  The trocars were removed.  The fascia of the umbilicus was closed with two interrupted figure-of-eight sutures of #0 Vicryl.  Skin incisions were closed with subcuticular sutures of #4-0 monocryl and Steri-Strips.  Clean bandages were placed, and the patient was taken to the  recovery room in stable condition.  The estimated blood loss was about 10 cc.  COMPLICATIONS:  None.  The sponge, needle, and instrument counts were correct. DD:  02/08/00 TD:  02/07/00 Job: [id number]

## 2011-05-03 ENCOUNTER — Ambulatory Visit (INDEPENDENT_AMBULATORY_CARE_PROVIDER_SITE_OTHER): Payer: Medicare Other | Admitting: Internal Medicine

## 2011-05-03 ENCOUNTER — Encounter: Payer: Self-pay | Admitting: Internal Medicine

## 2011-05-03 VITALS — BP 110/72 | HR 70 | Temp 98.3°F | Resp 16 | Ht 73.5 in | Wt 192.0 lb

## 2011-05-03 DIAGNOSIS — M1990 Unspecified osteoarthritis, unspecified site: Secondary | ICD-10-CM

## 2011-05-03 DIAGNOSIS — E7800 Pure hypercholesterolemia, unspecified: Secondary | ICD-10-CM

## 2011-05-03 DIAGNOSIS — Z136 Encounter for screening for cardiovascular disorders: Secondary | ICD-10-CM

## 2011-05-03 DIAGNOSIS — I10 Essential (primary) hypertension: Secondary | ICD-10-CM

## 2011-05-03 DIAGNOSIS — Z0000 Encounter for general adult medical examination without abnormal findings: Secondary | ICD-10-CM

## 2011-05-03 DIAGNOSIS — M109 Gout, unspecified: Secondary | ICD-10-CM

## 2011-05-03 LAB — LIPID PANEL
Cholesterol: 165 mg/dL (ref 0–200)
HDL: 41.5 mg/dL (ref >39.00)
Total CHOL/HDL Ratio: 4
Triglycerides: 190 mg/dL — ABNORMAL HIGH (ref 0.0–149.0)

## 2011-05-03 LAB — CBC WITH DIFFERENTIAL/PLATELET
Lymphocytes Relative: 36.5 % (ref 12.0–46.0)
MCHC: 33.7 g/dL (ref 30.0–36.0)
MCV: 90.3 fl (ref 78.0–100.0)
Monocytes Absolute: 0.6 10*3/uL (ref 0.1–1.0)
Neutrophils Relative %: 49.8 % (ref 43.0–77.0)
Platelets: 180 10*3/uL (ref 150.0–400.0)
RDW: 14.3 % (ref 11.5–14.6)

## 2011-05-03 LAB — BASIC METABOLIC PANEL
BUN: 19 mg/dL (ref 6–23)
CO2: 32 mEq/L (ref 19–32)
Calcium: 9.2 mg/dL (ref 8.4–10.5)
Creatinine, Ser: 1.2 mg/dL (ref 0.4–1.5)
Glucose, Bld: 97 mg/dL (ref 70–99)

## 2011-05-03 LAB — TSH: TSH: 1.03 u[IU]/mL (ref 0.35–5.50)

## 2011-05-03 LAB — HEPATIC FUNCTION PANEL
Bilirubin, Direct: 0.2 mg/dL (ref 0.0–0.3)
Total Protein: 6.5 g/dL (ref 6.0–8.3)

## 2011-05-03 MED ORDER — PROBENECID 500 MG PO TABS: 500.0000 mg | ORAL_TABLET | Freq: Two times a day (BID) | ORAL | Status: DC

## 2011-05-03 MED ORDER — TAMSULOSIN HCL 0.4 MG PO CAPS: 0.4000 mg | ORAL_CAPSULE | Freq: Every day | ORAL | Status: DC

## 2011-05-03 MED ORDER — LISINOPRIL 40 MG PO TABS: 40.0000 mg | ORAL_TABLET | Freq: Every day | ORAL | Status: DC

## 2011-05-03 MED ORDER — TERAZOSIN HCL 10 MG PO CAPS: 10.0000 mg | ORAL_CAPSULE | Freq: Every day | ORAL | Status: DC

## 2011-05-03 MED ORDER — NEOMYCIN-POLYMYXIN-HC 3.5-10000-1 OT SOLN: 3.0000 [drp] | Freq: Three times a day (TID) | OTIC | Status: AC

## 2011-05-03 MED ORDER — PROPRANOLOL HCL ER 160 MG PO CP24: 160.0000 mg | ORAL_CAPSULE | Freq: Every day | ORAL | Status: DC

## 2011-05-03 NOTE — Patient Instructions (Signed)
Limit your sodium (Salt) intake    It is important that you exercise regularly, at least 20 minutes 3 to 4 times per week.  If you develop chest pain or shortness of breath seek  medical attention.  Return in 6 months for follow-up  

## 2011-05-03 NOTE — Progress Notes (Signed)
Subjective:    Patient ID: Jose Hood, male    DOB: 01/07/1941, 70 y.o.   MRN: [id number]  HPI CC: cpx - doing well.   History of Present Illness:   70 year old patient who is seen today for a annual exam.  Medical problems Include colonic polyps, dyslipidemia, and a history of hypertension. He is a history of gout, which has been stable, as well as osteoarthritis. His last colonoscopy was last year. He was hospitalized in December of last year for lymphocytic colitis. He is followed closely by urology His only new complaint is drainage from the left ear. He does have a history of a prior ruptured left tympanic membrane and prior history of otitis externa. There is no ear pain. He does have some chronic diminished auditory acuity on the left.   Here for Medicare AWV:  1. Risk factors based on Past M, S, F history: bibasilar risk factors include a history of hypertension, dyslipidemia, and family history of coronary artery disease. The patient has a personal history of colonic polyps.  2. Physical Activities: remains fairly active with daily walking; no exercise limitations.  3. Depression/mood: no history of depression or mood disorder  4. Hearing: diminished auditory acuity on the left  5. ADL's: totally independent in all aspects of daily living  6. Fall Risk: low  7. Home Safety: no problems identified  8. Height, weight, &visual acuity:height and weight are stable. No change in visual acuity  9. Counseling: regular exercise on a heart healthy diet. Encouraged follow-up colonoscopy in 4 years  10. Labs ordered based on risk factors: complete laboratory profile reviewed, including lipid profile  11. Referral Coordination-not indicated at this time  12. Care Plan- heart healthy diet, and more regular exercise. Encouraged  13. Cognitive Assessment- alert and oriented, with normal affect   Preventive Screening-Counseling & Management  Alcohol-Tobacco  Smoking Status: never   Allergies:    No Known Drug Allergies   Past History:  Past Medical History:  Reviewed history from 04/22/2009 and no changes required.  Colonic polyps, hx of  Gout  Hypertension  BPH  Diverticulosis, colon  external hemorrhoids  Hyperlipidemia   Past Surgical History:  Reviewed history from 04/22/2009 and no changes required.  Inguinal herniorrhaphy: '89 '01  colonoscopy 2005, 2010, 2011  Family History:  Reviewed history from 04/22/2009 and no changes required.  Family History of CAD Male 1st degree relative <60  Family History Kidney disease  Father s/p MI  Mother -Kidney Ca  father died age 71, MI  mother died at 37, renal cancer  One brother-age 35  l&w   Social History:  Reviewed history from 04/22/2009 and no changes required.  Never Smoked  Married  son and daughter - 5 grandchildren      Review of Systems  Constitutional: Negative for fever, chills, activity change, appetite change and fatigue.  HENT: Negative for hearing loss, ear pain, congestion, rhinorrhea, sneezing, mouth sores, trouble swallowing, neck pain, neck stiffness, dental problem, voice change, sinus pressure and tinnitus.   Eyes: Negative for photophobia, pain, redness and visual disturbance.  Respiratory: Negative for apnea, cough, choking, chest tightness, shortness of breath and wheezing.   Cardiovascular: Negative for chest pain, palpitations and leg swelling.  Gastrointestinal: Negative for nausea, vomiting, abdominal pain, diarrhea, constipation, blood in stool, abdominal distention, anal bleeding and rectal pain.  Genitourinary: Negative for dysuria, urgency, frequency, hematuria, flank pain, decreased urine volume, discharge, penile swelling, scrotal swelling, difficulty urinating, genital sores and testicular pain.  Musculoskeletal: Negative for myalgias, back pain, joint swelling, arthralgias and gait problem.  Skin: Negative for color change, rash and wound.  Neurological: Negative for  dizziness, tremors, seizures, syncope, facial asymmetry, speech difficulty, weakness, light-headedness, numbness and headaches.  Hematological: Negative for adenopathy. Does not bruise/bleed easily.  Psychiatric/Behavioral: Negative for suicidal ideas, hallucinations, behavioral problems, confusion, sleep disturbance, self-injury, dysphoric mood, decreased concentration and agitation. The patient is not nervous/anxious.        Objective:   Physical Exam  Constitutional: He appears well-developed and well-nourished.  HENT:  Head: Normocephalic and atraumatic.  Right Ear: External ear normal.  Left Ear: External ear normal.  Nose: Nose normal.  Mouth/Throat: Oropharynx is clear and moist.  Eyes: Conjunctivae and EOM are normal. Pupils are equal, round, and reactive to light. No scleral icterus.  Neck: Normal range of motion. Neck supple. No JVD present. No thyromegaly present.  Cardiovascular: Regular rhythm, normal heart sounds and intact distal pulses.  Exam reveals no gallop and no friction rub.   No murmur heard. Pulmonary/Chest: Effort normal and breath sounds normal. He exhibits no tenderness.  Abdominal: Soft. Bowel sounds are normal. He exhibits no distension and no mass. There is no tenderness.  Genitourinary: Penis normal.       Left hydrocele  Musculoskeletal: Normal range of motion. He exhibits no edema and no tenderness.  Lymphadenopathy:    He has no cervical adenopathy.  Neurological: He is alert. He has normal reflexes. No cranial nerve deficit. Coordination normal.  Skin: Skin is warm and dry. No rash noted.  Psychiatric: He has a normal mood and affect. His behavior is normal.          Assessment & Plan:   Hypertension well controlled Annual health assessment Dyslipidemia Chronic left otitis externa. Will treat with the otic drops BPH. Followed by urology Osteoarthritis

## 2011-11-01 ENCOUNTER — Ambulatory Visit (INDEPENDENT_AMBULATORY_CARE_PROVIDER_SITE_OTHER): Payer: Medicare Other | Admitting: Internal Medicine

## 2011-11-01 ENCOUNTER — Encounter: Payer: Self-pay | Admitting: Internal Medicine

## 2011-11-01 DIAGNOSIS — M109 Gout, unspecified: Secondary | ICD-10-CM

## 2011-11-01 DIAGNOSIS — I10 Essential (primary) hypertension: Secondary | ICD-10-CM

## 2011-11-01 DIAGNOSIS — E785 Hyperlipidemia, unspecified: Secondary | ICD-10-CM

## 2011-11-01 DIAGNOSIS — M1990 Unspecified osteoarthritis, unspecified site: Secondary | ICD-10-CM

## 2011-11-01 MED ORDER — PROBENECID 500 MG PO TABS: 500.0000 mg | ORAL_TABLET | Freq: Two times a day (BID) | ORAL | Status: DC

## 2011-11-01 MED ORDER — LISINOPRIL 40 MG PO TABS: 40.0000 mg | ORAL_TABLET | Freq: Every day | ORAL | Status: DC

## 2011-11-01 MED ORDER — TAMSULOSIN HCL 0.4 MG PO CAPS: 0.4000 mg | ORAL_CAPSULE | Freq: Every day | ORAL | Status: DC

## 2011-11-01 MED ORDER — PROPRANOLOL HCL ER 160 MG PO CP24: 160.0000 mg | ORAL_CAPSULE | Freq: Every day | ORAL | Status: DC

## 2011-11-01 NOTE — Progress Notes (Signed)
  Subjective:    Patient ID: Jose Hood, male    DOB: [birth year], 71 y.o.   MRN: [id number]  HPI 71 year old patient who is seen today for his biannual followup. He has a history of hypertension and dyslipidemia. He has been seen by urology and Hytrin has been discontinued and Flomax substituted. He continues to do well and blood pressure is normal today. He has a history of gout which has been stable he remains on Benemid. No gouty arthritis over the past 6 months. He has mild dyslipidemia controlled with diet. No concerns or complaints today. He does require medication refill    Review of Systems  Constitutional: Negative for fever, chills, appetite change and fatigue.  HENT: Negative for hearing loss, ear pain, congestion, sore throat, trouble swallowing, neck stiffness, dental problem, voice change and tinnitus.   Eyes: Negative for pain, discharge and visual disturbance.  Respiratory: Negative for cough, chest tightness, wheezing and stridor.   Cardiovascular: Negative for chest pain, palpitations and leg swelling.  Gastrointestinal: Negative for nausea, vomiting, abdominal pain, diarrhea, constipation, blood in stool and abdominal distention.  Genitourinary: Negative for urgency, hematuria, flank pain, discharge, difficulty urinating and genital sores.  Musculoskeletal: Negative for myalgias, back pain, joint swelling, arthralgias and gait problem.  Skin: Negative for rash.  Neurological: Negative for dizziness, syncope, speech difficulty, weakness, numbness and headaches.  Hematological: Negative for adenopathy. Does not bruise/bleed easily.  Psychiatric/Behavioral: Negative for behavioral problems and dysphoric mood. The patient is not nervous/anxious.        Objective:   Physical Exam  Constitutional: He is oriented to person, place, and time. He appears well-developed.  HENT:  Head: Normocephalic.  Right Ear: External ear normal.  Left Ear: External ear normal.  Eyes:  Conjunctivae and EOM are normal.  Neck: Normal range of motion.  Cardiovascular: Normal rate and normal heart sounds.   Pulmonary/Chest: Breath sounds normal.  Abdominal: Bowel sounds are normal.  Musculoskeletal: Normal range of motion. He exhibits no edema and no tenderness.  Neurological: He is alert and oriented to person, place, and time.  Psychiatric: He has a normal mood and affect. His behavior is normal.          Assessment & Plan:   Hypertension. Well controlled we'll continue present regimen medications refilled BPH symptoms well controlled on Flomax. Hytrin discontinued Gout stable continue Benemid   Schedule CPX in 6 months

## 2011-11-01 NOTE — Patient Instructions (Signed)
Limit your sodium (Salt) intake    It is important that you exercise regularly, at least 20 minutes 3 to 4 times per week.  If you develop chest pain or shortness of breath seek  medical attention.  Return in 6 months for follow-up  Please check your blood pressure on a regular basis.  If it is consistently greater than 150/90, please make an office appointment.   

## 2012-03-02 IMAGING — CT CT ABD-PELV W/O CM
2 of 4 series · 17 of 46 positions shown, 19 images · non-contrast
Comparison: 10/09/2005

CLINICAL DATA: Weight loss.  Nausea vomiting and diarrhea.

CT ABDOMEN AND PELVIS WITHOUT CONTRAST
TECHNIQUE: Multidetector CT imaging of the abdomen and pelvis was
performed following the standard protocol without intravenous
contrast.

[Series 2: ap without · axial · non-contrast · 0.71mm/px · z∈[-498,-82]mm · 14 of 91 slices shown, 16 images]
[im 4/91  soft-tissue]
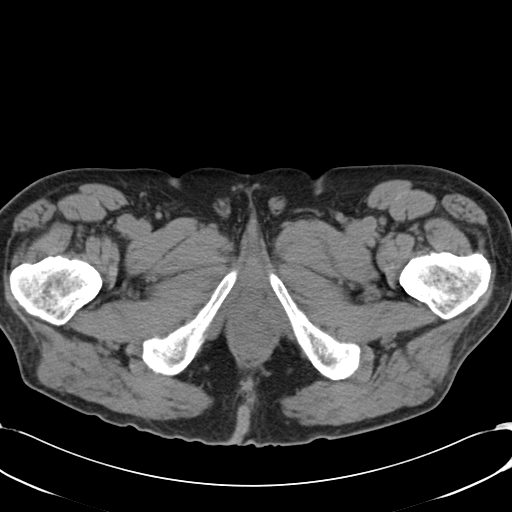
[im 4/91  bone]
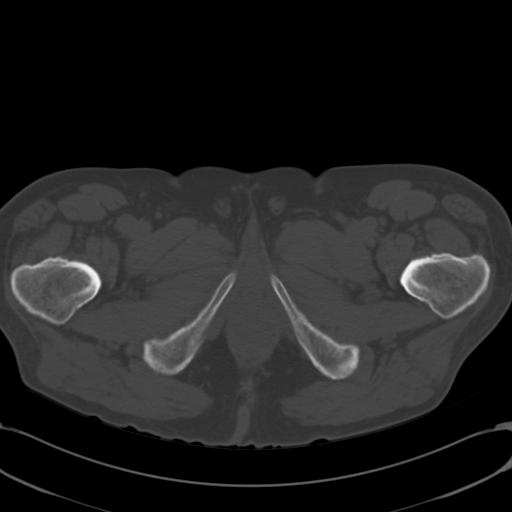
[im 11/91  soft-tissue]
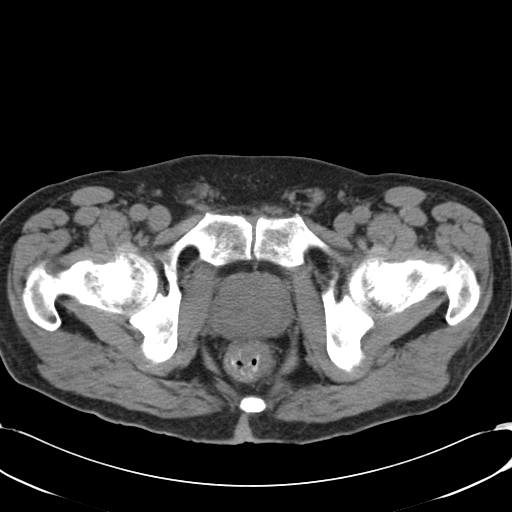
[im 19/91  soft-tissue]
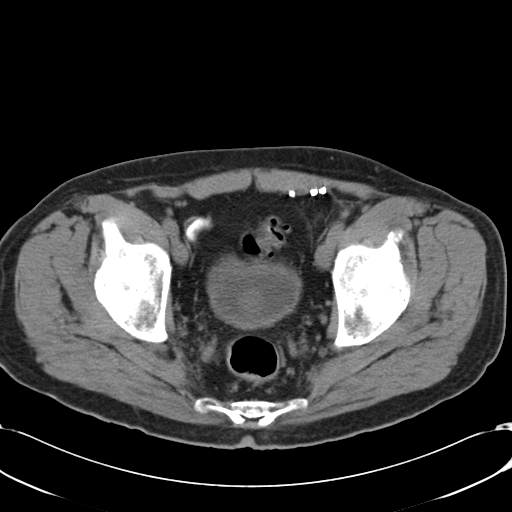
[im 26/91  soft-tissue]
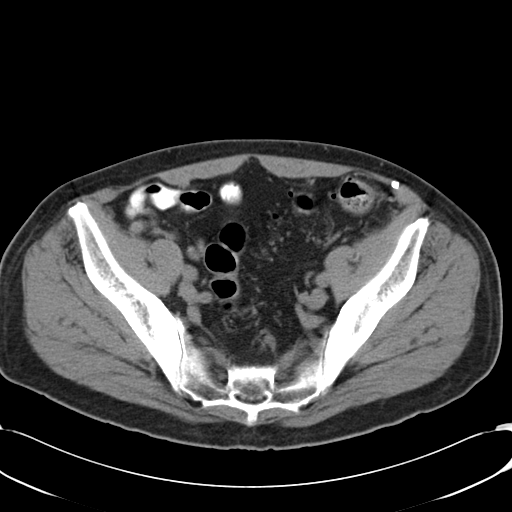
[im 29/91  soft-tissue]
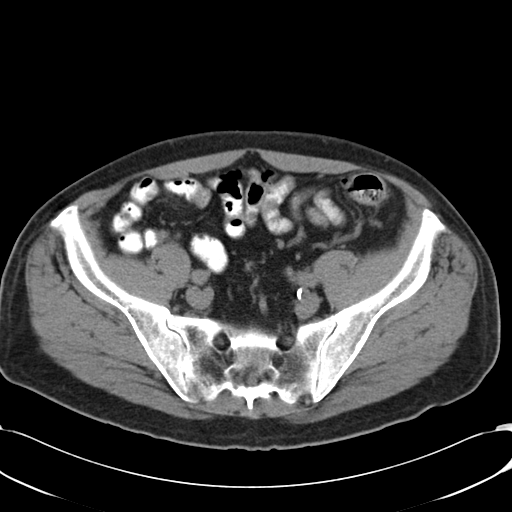
[im 37/91  soft-tissue]
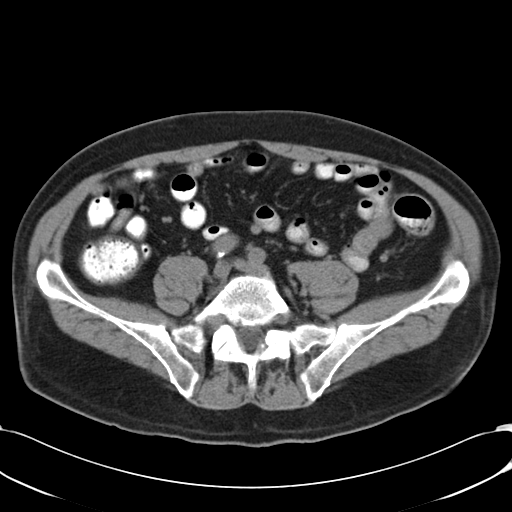
[im 44/91  soft-tissue]
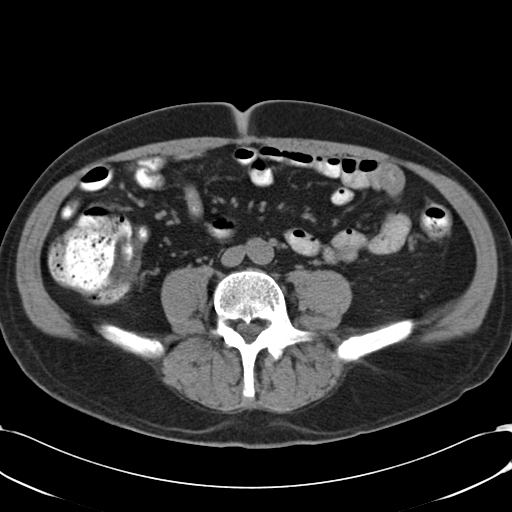
[im 47/91  soft-tissue]
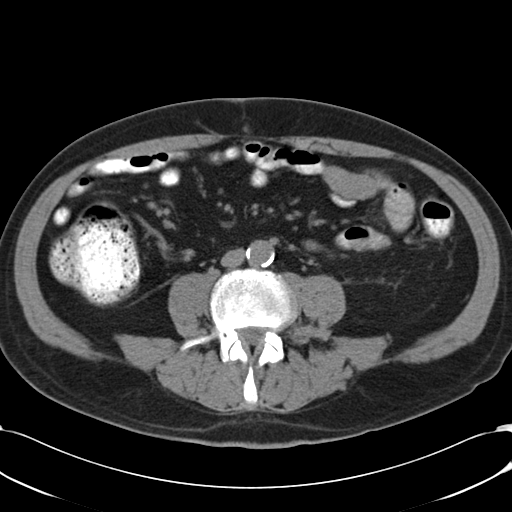
[im 55/91  soft-tissue]
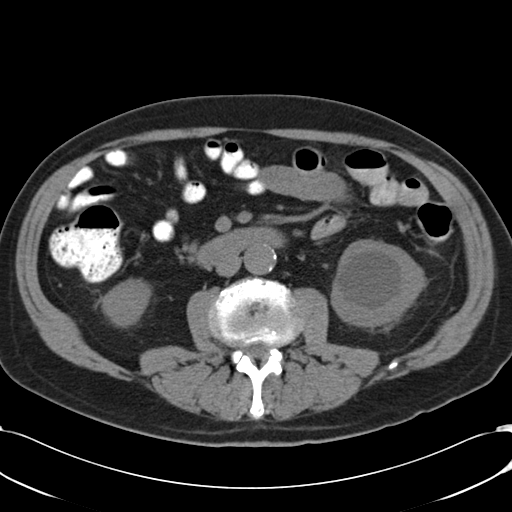
[im 55/91  bone]
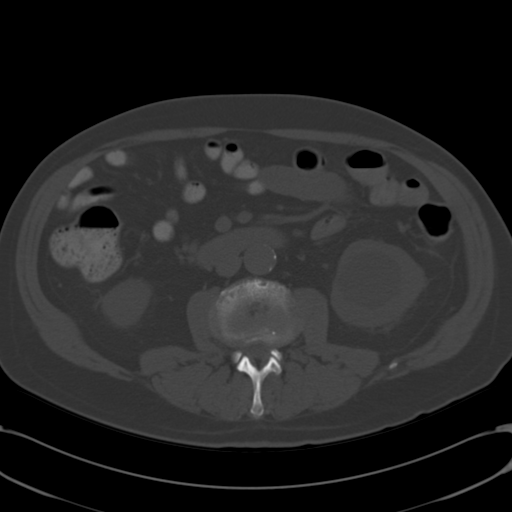
[im 62/91  soft-tissue]
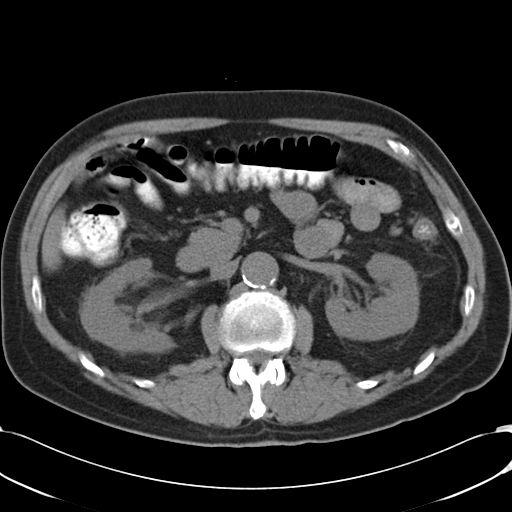
[im 69/91  soft-tissue]
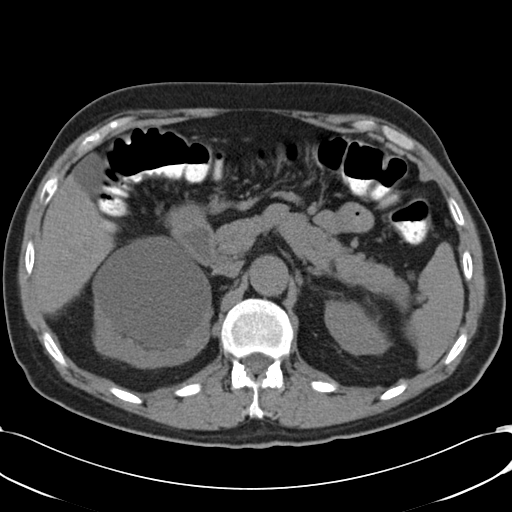
[im 73/91  soft-tissue]
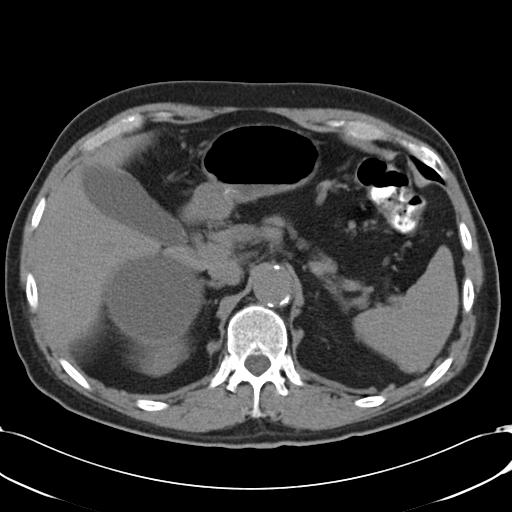
[im 80/91  soft-tissue]
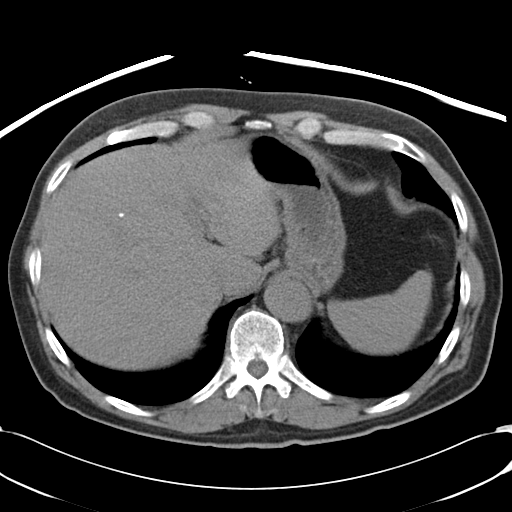
[im 87/91  soft-tissue]
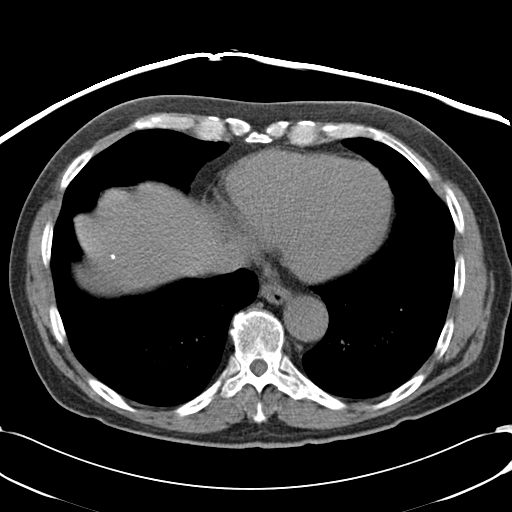

[Series 602: <mpr range> · coronal · 0.92mm/px · 3 of 111 slices shown]
[im 37/111  soft-tissue]
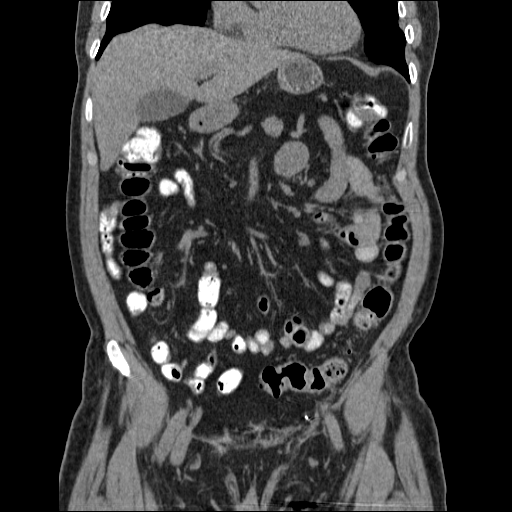
[im 49/111  soft-tissue]
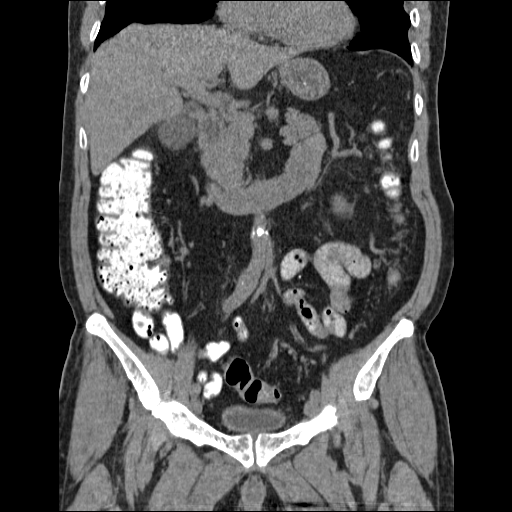
[im 62/111  soft-tissue]
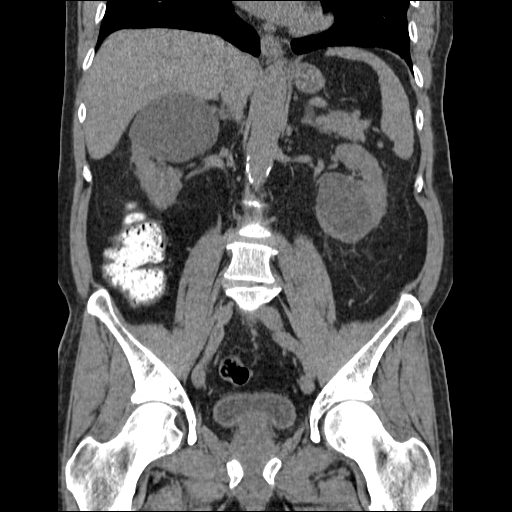

[17 of 46 positions shown; findings below may reference images not displayed]

FINDINGS: Lung bases are clear.  No pericardial or pleural effusion.

Stable calcified granulomas within the liver parenchyma.  The
gallbladder appears normal.  There is no biliary dilatation.

The pancreas is normal.  The spleen is unremarkable.  Both adrenal
glands are normal.  There are bilateral renal cysts.  These are
incompletely characterized without IV contrast material.  There is
no evidence for nephrolithiasis or obstructive uropathy.

The stomach and the small bowel loops appear normal.  The appendix
appears normal.  Enteric contrast material is seen within the colon
up to the level of the junction between the descending colon and
sigmoid colon.  There is diverticulosis involving the sigmoid
colon, but there is no pericolonic inflammatory change or fat
stranding identified to suggest acute diverticulitis.  There is no
free fluid identified within the upper abdomen or the pelvis.

The prostate gland is markedly enlarged and exerts mass effect upon
the bladder base.  There is no enlarged lymph nodes within the
upper abdomen or the pelvis.

Postoperative changes within the left inguinal region wall are
likely due to prior hernia repair.

Review of the visualized bony structures is significant for
osteopenia and mild degenerative disc disease.
IMPRESSION: 1.  There are no acute findings identified within the abdomen or
pelvis.
2.  Bilateral renal cysts are again noted and are incompletely
characterized due to the lack of IV contrast material.
3.  Sigmoid diverticulosis
4.  Prostate gland enlargement which exerts mass effect upon the
bladder base.

## 2012-05-04 ENCOUNTER — Ambulatory Visit (INDEPENDENT_AMBULATORY_CARE_PROVIDER_SITE_OTHER): Payer: Medicare Other | Admitting: Internal Medicine

## 2012-05-04 ENCOUNTER — Encounter: Payer: Self-pay | Admitting: Internal Medicine

## 2012-05-04 VITALS — BP 130/88 | HR 56 | Temp 98.2°F | Resp 18 | Ht 73.5 in | Wt 199.0 lb

## 2012-05-04 DIAGNOSIS — M109 Gout, unspecified: Secondary | ICD-10-CM

## 2012-05-04 DIAGNOSIS — E785 Hyperlipidemia, unspecified: Secondary | ICD-10-CM

## 2012-05-04 DIAGNOSIS — M1990 Unspecified osteoarthritis, unspecified site: Secondary | ICD-10-CM

## 2012-05-04 DIAGNOSIS — Z0000 Encounter for general adult medical examination without abnormal findings: Secondary | ICD-10-CM

## 2012-05-04 DIAGNOSIS — Z86010 Personal history of colonic polyps: Secondary | ICD-10-CM

## 2012-05-04 DIAGNOSIS — I10 Essential (primary) hypertension: Secondary | ICD-10-CM

## 2012-05-04 LAB — COMPREHENSIVE METABOLIC PANEL
ALT: 13 U/L (ref 0–53)
AST: 17 U/L (ref 0–37)
Alkaline Phosphatase: 42 U/L (ref 39–117)
BUN: 23 mg/dL (ref 6–23)
Calcium: 9 mg/dL (ref 8.4–10.5)
Chloride: 106 mEq/L (ref 96–112)
Creatinine, Ser: 1.2 mg/dL (ref 0.4–1.5)
Total Bilirubin: 1.6 mg/dL — ABNORMAL HIGH (ref 0.3–1.2)

## 2012-05-04 LAB — LIPID PANEL
Cholesterol: 162 mg/dL (ref 0–200)
HDL: 40.1 mg/dL (ref >39.00)
LDL Cholesterol: 89 mg/dL (ref 0–99)
Total CHOL/HDL Ratio: 4
Triglycerides: 166 mg/dL — ABNORMAL HIGH (ref 0.0–149.0)
VLDL: 33.2 mg/dL (ref 0.0–40.0)

## 2012-05-04 LAB — CBC WITH DIFFERENTIAL/PLATELET
Basophils Absolute: 0 10*3/uL (ref 0.0–0.1)
Basophils Relative: 0.4 % (ref 0.0–3.0)
Eosinophils Absolute: 0.1 10*3/uL (ref 0.0–0.7)
HCT: 48.9 % (ref 39.0–52.0)
Hemoglobin: 16.4 g/dL (ref 13.0–17.0)
Lymphocytes Relative: 37.2 % (ref 12.0–46.0)
Lymphs Abs: 1.6 10*3/uL (ref 0.7–4.0)
MCHC: 33.6 g/dL (ref 30.0–36.0)
Neutro Abs: 2.1 10*3/uL (ref 1.4–7.7)
RBC: 5.45 Mil/uL (ref 4.22–5.81)
RDW: 14.1 % (ref 11.5–14.6)

## 2012-05-04 MED ORDER — TAMSULOSIN HCL 0.4 MG PO CAPS: 0.4000 mg | ORAL_CAPSULE | Freq: Every day | ORAL | Status: DC

## 2012-05-04 MED ORDER — PROBENECID 500 MG PO TABS: 500.0000 mg | ORAL_TABLET | Freq: Two times a day (BID) | ORAL | Status: DC

## 2012-05-04 MED ORDER — LISINOPRIL 40 MG PO TABS: 40.0000 mg | ORAL_TABLET | Freq: Every day | ORAL | Status: DC

## 2012-05-04 MED ORDER — PROPRANOLOL HCL ER 160 MG PO CP24: 160.0000 mg | ORAL_CAPSULE | Freq: Every day | ORAL | Status: DC

## 2012-05-04 NOTE — Patient Instructions (Signed)
Limit your sodium (Salt) intake    It is important that you exercise regularly, at least 20 minutes 3 to 4 times per week.  If you develop chest pain or shortness of breath seek  medical attention.  Please check your blood pressure on a regular basis.  If it is consistently greater than 150/90, please make an office appointment.  Return in one year for follow-up  

## 2012-05-04 NOTE — Progress Notes (Signed)
Patient ID: Jt Brabec, male   DOB: 08-Dec-1940, 71 y.o.   MRN: [id number]  Subjective:    Patient ID: Dietrich Pates, male    DOB: 06/27/1941, 71 y.o.   MRN: [id number]  Hypertension Pertinent negatives include no chest pain, headaches, neck pain, palpitations or shortness of breath.   CC: cpx - doing well.   History of Present Illness:   71 year-old patient who is seen today for a annual exam.  Medical problems Include colonic polyps, dyslipidemia, and a history of hypertension. He is a history of gout, which has been stable, as well as osteoarthritis. His last colonoscopy was 2011. He was hospitalized in December of 2011 for lymphocytic colitis. He is followed closely by urology  He does have a history of a prior ruptured left tympanic membrane and prior history of otitis externa. There is no ear pain. He does have some chronic diminished auditory acuity on the left.   Here for Medicare AWV:  1. Risk factors based on Past M, S, F history: bibasilar risk factors include a history of hypertension, dyslipidemia, and family history of coronary artery disease. The patient has a personal history of colonic polyps.  2. Physical Activities: remains fairly active with daily walking; no exercise limitations.  3. Depression/mood: no history of depression or mood disorder  4. Hearing: diminished auditory acuity on the left  5. ADL's: totally independent in all aspects of daily living  6. Fall Risk: low  7. Home Safety: no problems identified  8. Height, weight, &visual acuity:height and weight are stable. No change in visual acuity  9. Counseling: regular exercise on a heart healthy diet. Encouraged follow-up colonoscopy in 4 years  10. Labs ordered based on risk factors: complete laboratory profile reviewed, including lipid profile  11. Referral Coordination-not indicated at this time  12. Care Plan- heart healthy diet, and more regular exercise. Encouraged  13. Cognitive Assessment- alert and oriented,  with normal affect   Preventive Screening-Counseling & Management  Alcohol-Tobacco  Smoking Status: never   Allergies:  No Known Drug Allergies   Past History:  Past Medical History:  Reviewed history from 04/22/2009 and no changes required.  Colonic polyps, hx of  Gout  Hypertension  BPH  Diverticulosis, colon  external hemorrhoids  Hyperlipidemia   Past Surgical History:  Reviewed history from 04/22/2009 and no changes required.  Inguinal herniorrhaphy: '89 '01  colonoscopy 2005, 2010, 2011  Family History:  Reviewed history from 04/22/2009 and no changes required.  Family History of CAD Male 1st degree relative <60  Family History Kidney disease  Father s/p MI  Mother -Kidney Ca  father died age 32, MI  mother died at 60, renal cancer  One brother-age 51  l&w   Social History:  Reviewed history from 04/22/2009 and no changes required.  Never Smoked  Married  son and daughter - 5 grandchildren      Review of Systems  Constitutional: Negative for fever, chills, activity change, appetite change and fatigue.  HENT: Negative for hearing loss, ear pain, congestion, rhinorrhea, sneezing, mouth sores, trouble swallowing, neck pain, neck stiffness, dental problem, voice change, sinus pressure and tinnitus.   Eyes: Negative for photophobia, pain, redness and visual disturbance.  Respiratory: Negative for apnea, cough, choking, chest tightness, shortness of breath and wheezing.   Cardiovascular: Negative for chest pain, palpitations and leg swelling.  Gastrointestinal: Negative for nausea, vomiting, abdominal pain, diarrhea, constipation, blood in stool, abdominal distention, anal bleeding and rectal pain.  Genitourinary: Negative for  dysuria, urgency, frequency, hematuria, flank pain, decreased urine volume, discharge, penile swelling, scrotal swelling, difficulty urinating, genital sores and testicular pain.  Musculoskeletal: Negative for myalgias, back pain, joint  swelling, arthralgias and gait problem.  Skin: Negative for color change, rash and wound.  Neurological: Negative for dizziness, tremors, seizures, syncope, facial asymmetry, speech difficulty, weakness, light-headedness, numbness and headaches.  Hematological: Negative for adenopathy. Does not bruise/bleed easily.  Psychiatric/Behavioral: Negative for suicidal ideas, hallucinations, behavioral problems, confusion, disturbed wake/sleep cycle, self-injury, dysphoric mood, decreased concentration and agitation. The patient is not nervous/anxious.        Objective:   Physical Exam  Constitutional: He appears well-developed and well-nourished.  HENT:  Head: Normocephalic and atraumatic.  Right Ear: External ear normal.  Left Ear: External ear normal.  Nose: Nose normal.  Mouth/Throat: Oropharynx is clear and moist.  Eyes: Conjunctivae and EOM are normal. Pupils are equal, round, and reactive to light. No scleral icterus.  Neck: Normal range of motion. Neck supple. No JVD present. No thyromegaly present.  Cardiovascular: Regular rhythm, normal heart sounds and intact distal pulses.  Exam reveals no gallop and no friction rub.   No murmur heard. Pulmonary/Chest: Effort normal and breath sounds normal. He exhibits no tenderness.  Abdominal: Soft. Bowel sounds are normal. He exhibits no distension and no mass. There is no tenderness.  Genitourinary: Penis normal.       Left hydrocele  Musculoskeletal: Normal range of motion. He exhibits no edema and no tenderness.  Lymphadenopathy:    He has no cervical adenopathy.  Neurological: He is alert. He has normal reflexes. No cranial nerve deficit. Coordination normal.  Skin: Skin is warm and dry. No rash noted.  Psychiatric: He has a normal mood and affect. His behavior is normal.          Assessment & Plan:   Hypertension well controlled Annual health assessment Dyslipidemia  BPH. Followed by urology Osteoarthritis

## 2012-09-14 ENCOUNTER — Other Ambulatory Visit: Payer: Self-pay | Admitting: Internal Medicine

## 2012-09-26 ENCOUNTER — Telehealth: Payer: Self-pay | Admitting: Internal Medicine

## 2012-09-26 MED ORDER — PROPRANOLOL HCL ER 160 MG PO CP24: 160.0000 mg | ORAL_CAPSULE | Freq: Every day | ORAL | Status: DC

## 2012-09-26 NOTE — Telephone Encounter (Signed)
Spoke to pt's wife told her Rx refill was sent to [facility] for Propanolol ER and to make sure make appt for July for CPX. Jose Hood verbalized understanding and stated already has appt scheduled.

## 2012-09-26 NOTE — Telephone Encounter (Signed)
Patient's wife called stating that her husband's propanolol er was denied stating that he need an office visit and the patient is scheduled for a visit foe 04/2012 as this is his cpx. Please assist.

## 2012-12-13 ENCOUNTER — Other Ambulatory Visit: Payer: Self-pay | Admitting: Internal Medicine

## 2012-12-19 ENCOUNTER — Other Ambulatory Visit: Payer: Self-pay | Admitting: *Deleted

## 2012-12-19 MED ORDER — TAMSULOSIN HCL 0.4 MG PO CAPS: 0.4000 mg | ORAL_CAPSULE | Freq: Every day | ORAL | Status: DC

## 2012-12-20 ENCOUNTER — Other Ambulatory Visit: Payer: Self-pay | Admitting: *Deleted

## 2012-12-20 MED ORDER — PROPRANOLOL HCL ER 160 MG PO CP24: ORAL_CAPSULE | ORAL | Status: DC

## 2012-12-20 NOTE — Telephone Encounter (Signed)
Patient states that they did not receive Rx.  Refill sent

## 2013-03-22 ENCOUNTER — Other Ambulatory Visit: Payer: Self-pay | Admitting: Internal Medicine

## 2013-05-06 ENCOUNTER — Encounter: Payer: Medicare Other | Admitting: Internal Medicine

## 2013-06-07 ENCOUNTER — Encounter: Payer: Self-pay | Admitting: Internal Medicine

## 2013-06-07 ENCOUNTER — Ambulatory Visit (INDEPENDENT_AMBULATORY_CARE_PROVIDER_SITE_OTHER): Payer: Medicare Other | Admitting: Internal Medicine

## 2013-06-07 VITALS — BP 140/90 | HR 57 | Temp 97.9°F | Ht 73.5 in | Wt 204.0 lb

## 2013-06-07 DIAGNOSIS — M109 Gout, unspecified: Secondary | ICD-10-CM

## 2013-06-07 DIAGNOSIS — Z86010 Personal history of colonic polyps: Secondary | ICD-10-CM

## 2013-06-07 DIAGNOSIS — I10 Essential (primary) hypertension: Secondary | ICD-10-CM

## 2013-06-07 DIAGNOSIS — E785 Hyperlipidemia, unspecified: Secondary | ICD-10-CM

## 2013-06-07 DIAGNOSIS — M1990 Unspecified osteoarthritis, unspecified site: Secondary | ICD-10-CM

## 2013-06-07 DIAGNOSIS — N400 Enlarged prostate without lower urinary tract symptoms: Secondary | ICD-10-CM

## 2013-06-07 DIAGNOSIS — Z0000 Encounter for general adult medical examination without abnormal findings: Secondary | ICD-10-CM

## 2013-06-07 DIAGNOSIS — K5730 Diverticulosis of large intestine without perforation or abscess without bleeding: Secondary | ICD-10-CM

## 2013-06-07 DIAGNOSIS — E790 Hyperuricemia without signs of inflammatory arthritis and tophaceous disease: Secondary | ICD-10-CM

## 2013-06-07 DIAGNOSIS — R7989 Other specified abnormal findings of blood chemistry: Secondary | ICD-10-CM

## 2013-06-07 LAB — TSH: TSH: 1.84 u[IU]/mL (ref 0.35–5.50)

## 2013-06-07 LAB — COMPREHENSIVE METABOLIC PANEL
ALT: 21 U/L (ref 0–53)
AST: 24 U/L (ref 0–37)
Albumin: 3.9 g/dL (ref 3.5–5.2)
Alkaline Phosphatase: 50 U/L (ref 39–117)
BUN: 25 mg/dL — ABNORMAL HIGH (ref 6–23)
Calcium: 9.4 mg/dL (ref 8.4–10.5)
Chloride: 102 mEq/L (ref 96–112)
Potassium: 4.4 mEq/L (ref 3.5–5.1)
Sodium: 140 mEq/L (ref 135–145)
Total Protein: 6.6 g/dL (ref 6.0–8.3)

## 2013-06-07 LAB — LIPID PANEL
HDL: 35.3 mg/dL — ABNORMAL LOW (ref >39.00)
Total CHOL/HDL Ratio: 5
VLDL: 66.2 mg/dL — ABNORMAL HIGH (ref 0.0–40.0)

## 2013-06-07 LAB — CBC WITH DIFFERENTIAL/PLATELET
Basophils Absolute: 0 10*3/uL (ref 0.0–0.1)
Eosinophils Absolute: 0.1 10*3/uL (ref 0.0–0.7)
Lymphocytes Relative: 36.7 % (ref 12.0–46.0)
MCHC: 34.1 g/dL (ref 30.0–36.0)
Monocytes Relative: 13.2 % — ABNORMAL HIGH (ref 3.0–12.0)
Neutrophils Relative %: 46.7 % (ref 43.0–77.0)
Platelets: 190 10*3/uL (ref 150.0–400.0)
RDW: 13.7 % (ref 11.5–14.6)

## 2013-06-07 LAB — PSA: PSA: 0.95 ng/mL (ref 0.10–4.00)

## 2013-06-07 LAB — LDL CHOLESTEROL, DIRECT: Direct LDL: 66.2 mg/dL

## 2013-06-07 NOTE — Progress Notes (Signed)
Patient ID: Jose Hood, male   DOB: January 22, 1941, 72 y.o.   MRN: [id number] Patient ID: Jose Hood, male   DOB: 1940-11-23, 72 y.o.   MRN: [id number]  Subjective:    Patient ID: Jose Hood, male    DOB: [birth year], 72 y.o.   MRN: [id number]  Hypertension Pertinent negatives include no chest pain, headaches, neck pain, palpitations or shortness of breath.   CC: cpx - doing well.   History of Present Illness:   72 year-old patient who is seen today for a annual exam.  Medical problems Include colonic polyps, dyslipidemia, and a history of hypertension. He is a history of gout, which has been stable, as well as osteoarthritis. His last colonoscopy was 2011. He was hospitalized in December of 2011 for lymphocytic colitis. He is followed closely by urology  Wt Readings from Last 3 Encounters:  06/07/13 204 lb (92.534 kg)  05/04/12 199 lb (90.266 kg)  11/01/11 200 lb (90.719 kg)    He does have a history of a prior ruptured left tympanic membrane and prior history of otitis externa. There is no ear pain. He does have some chronic diminished auditory acuity on the left.   Here for Medicare AWV:  1. Risk factors based on Past M, S, F history: bibasilar risk factors include a history of hypertension, dyslipidemia, and family history of coronary artery disease. The patient has a personal history of colonic polyps.  2. Physical Activities: remains fairly active with daily walking; no exercise limitations.  3. Depression/mood: no history of depression or mood disorder  4. Hearing: diminished auditory acuity on the left  5. ADL's: totally independent in all aspects of daily living  6. Fall Risk: low  7. Home Safety: no problems identified  8. Height, weight, &visual acuity:height and weight are stable. No change in visual acuity  9. Counseling: regular exercise on a heart healthy diet. Encouraged follow-up colonoscopy in 4 years  10. Labs ordered based on risk factors: complete laboratory profile reviewed,  including lipid profile  11. Referral Coordination-not indicated at this time  12. Care Plan- heart healthy diet, and more regular exercise. Encouraged  13. Cognitive Assessment- alert and oriented, with normal affect   Preventive Screening-Counseling & Management  Alcohol-Tobacco  Smoking Status: never   Allergies:  No Known Drug Allergies   Past History:  Past Medical History:   Colonic polyps, hx of  Gout  Hypertension  BPH  Diverticulosis, colon  external hemorrhoids  Hyperlipidemia   Past Surgical History:    Inguinal herniorrhaphy: '89 '01  colonoscopy 2005, 2010, 2011  Family History:   Family History of CAD Male 1st degree relative <60  Family History Kidney disease  Father s/p MI  Mother -Kidney Ca  father died age 21, MI  mother died at 19, renal cancer  One brother-age 17  l&w   Social History:  That is without wheezes or this throat airway Never Smoked  Married  son and daughter - 5 grandchildren      Review of Systems  Constitutional: Negative for fever, chills, activity change, appetite change and fatigue.  HENT: Negative for hearing loss, ear pain, congestion, rhinorrhea, sneezing, mouth sores, trouble swallowing, neck pain, neck stiffness, dental problem, voice change, sinus pressure and tinnitus.   Eyes: Negative for photophobia, pain, redness and visual disturbance.  Respiratory: Negative for apnea, cough, choking, chest tightness, shortness of breath and wheezing.   Cardiovascular: Negative for chest pain, palpitations and leg swelling.  Gastrointestinal: Negative for nausea, vomiting,  abdominal pain, diarrhea, constipation, blood in stool, abdominal distention, anal bleeding and rectal pain.  Genitourinary: Negative for dysuria, urgency, frequency, hematuria, flank pain, decreased urine volume, discharge, penile swelling, scrotal swelling, difficulty urinating, genital sores and testicular pain.  Musculoskeletal: Negative for myalgias, back  pain, joint swelling, arthralgias and gait problem.  Skin: Negative for color change, rash and wound.  Neurological: Negative for dizziness, tremors, seizures, syncope, facial asymmetry, speech difficulty, weakness, light-headedness, numbness and headaches.  Hematological: Negative for adenopathy. Does not bruise/bleed easily.  Psychiatric/Behavioral: Negative for suicidal ideas, hallucinations, behavioral problems, confusion, sleep disturbance, self-injury, dysphoric mood, decreased concentration and agitation. The patient is not nervous/anxious.        Objective:   Physical Exam  Constitutional: He appears well-developed and well-nourished.  Blood pressure 140/90  HENT:  Head: Normocephalic and atraumatic.  Right Ear: External ear normal.  Left Ear: External ear normal.  Nose: Nose normal.  Mouth/Throat: Oropharynx is clear and moist.  Eyes: Conjunctivae and EOM are normal. Pupils are equal, round, and reactive to light. No scleral icterus.  Neck: Normal range of motion. Neck supple. No JVD present. No thyromegaly present.  Cardiovascular: Regular rhythm, normal heart sounds and intact distal pulses.  Exam reveals no gallop and no friction rub.   No murmur heard. Pulmonary/Chest: Effort normal and breath sounds normal. He exhibits no tenderness.  Abdominal: Soft. Bowel sounds are normal. He exhibits no distension and no mass. There is no tenderness.  Right lower quadrant scar  Genitourinary: Penis normal.  Left hydrocele  Musculoskeletal: Normal range of motion. He exhibits no edema and no tenderness.  Lymphadenopathy:    He has no cervical adenopathy.  Neurological: He is alert. He has normal reflexes. No cranial nerve deficit. Coordination normal.  Skin: Skin is warm and dry. No rash noted.  Psychiatric: He has a normal mood and affect. His behavior is normal.          Assessment & Plan:   Hypertension well controlled Annual health assessment Dyslipidemia  BPH.  Followed by urology Osteoarthritis

## 2013-06-07 NOTE — Patient Instructions (Signed)

## 2013-06-28 ENCOUNTER — Other Ambulatory Visit: Payer: Self-pay | Admitting: Family Medicine

## 2013-06-28 ENCOUNTER — Other Ambulatory Visit: Payer: Self-pay | Admitting: Internal Medicine

## 2013-07-12 ENCOUNTER — Other Ambulatory Visit: Payer: Self-pay | Admitting: Internal Medicine

## 2013-07-12 ENCOUNTER — Telehealth: Payer: Self-pay | Admitting: Internal Medicine

## 2013-07-12 MED ORDER — PROBENECID 500 MG PO TABS: ORAL_TABLET | ORAL | Status: DC

## 2013-07-12 NOTE — Telephone Encounter (Signed)
Pt wife called and stated that the pt is completely out of his probenecid (BENEMID) 500 MG tablet. Although they are waiting for his mail order to fill it, she would like a 10 day supply called into [facility] on [address]. Please assist.

## 2013-07-12 NOTE — Telephone Encounter (Signed)
Pt notified Rx sent to pharmacy

## 2013-12-09 ENCOUNTER — Encounter: Payer: Self-pay | Admitting: Internal Medicine

## 2013-12-09 ENCOUNTER — Ambulatory Visit (INDEPENDENT_AMBULATORY_CARE_PROVIDER_SITE_OTHER): Payer: Medicare Other | Admitting: Internal Medicine

## 2013-12-09 VITALS — BP 150/90 | HR 59 | Temp 98.4°F | Resp 20 | Ht 73.5 in | Wt 204.0 lb

## 2013-12-09 DIAGNOSIS — I10 Essential (primary) hypertension: Secondary | ICD-10-CM

## 2013-12-09 DIAGNOSIS — E785 Hyperlipidemia, unspecified: Secondary | ICD-10-CM

## 2013-12-09 DIAGNOSIS — M109 Gout, unspecified: Secondary | ICD-10-CM

## 2013-12-09 DIAGNOSIS — Z23 Encounter for immunization: Secondary | ICD-10-CM

## 2013-12-09 MED ORDER — LISINOPRIL 40 MG PO TABS: ORAL_TABLET | ORAL | Status: DC

## 2013-12-09 MED ORDER — PROBENECID 500 MG PO TABS: ORAL_TABLET | ORAL | Status: DC

## 2013-12-09 MED ORDER — PROPRANOLOL HCL ER 160 MG PO CP24: ORAL_CAPSULE | ORAL | Status: DC

## 2013-12-09 MED ORDER — FINASTERIDE 5 MG PO TABS: 5.0000 mg | ORAL_TABLET | Freq: Every day | ORAL | Status: DC

## 2013-12-09 MED ORDER — TAMSULOSIN HCL 0.4 MG PO CAPS: 0.4000 mg | ORAL_CAPSULE | Freq: Every day | ORAL | Status: DC

## 2013-12-09 NOTE — Progress Notes (Signed)
Pre-visit discussion using our clinic review tool. No additional management support is needed unless otherwise documented below in the visit note.  

## 2013-12-09 NOTE — Patient Instructions (Signed)
Limit your sodium (Salt) intake  Please check your blood pressure on a regular basis.  If it is consistently greater than 150/90, please make an office appointment.  Return in 6 months for follow-up   

## 2013-12-09 NOTE — Progress Notes (Signed)
   Subjective:    Patient ID: Jose Hood, male    DOB: [birth year], 73 y.o.   MRN: [id number]  HPI 73 year old patient who is seen today for followup.  He has a history of mild dyslipidemia, treated, hypertension, and a history of gout.  His gout has been stable.  He also has a history of arthritis and colonic polyps.  He is seen today for his six-month followup and doing quite well without concerns or complaints.  Laboratory studies reviewed from his physical 6 months ago and were unremarkable.  History reviewed. No pertinent past medical history.  History   Social History  . Marital Status: Married    Spouse Name: N/A    Number of Children: N/A  . Years of Education: N/A   Occupational History  . Not on file.   Social History Main Topics  . Smoking status: Never Smoker   . Smokeless tobacco: Never Used  . Alcohol Use: No  . Drug Use: No  . Sexual Activity: Not on file   Other Topics Concern  . Not on file   Social History Narrative  . No narrative on file    History reviewed. No pertinent past surgical history.  No family history on file.  Allergies  Allergen Reactions  . Metronidazole     No current outpatient prescriptions on file prior to visit.   No current facility-administered medications on file prior to visit.    BP 150/90  Pulse 59  Temp(Src) 98.4 F (36.9 C) (Oral)  Resp 20  Ht 6' 1.5" (1.867 m)  Wt 204 lb (92.534 kg)  BMI 26.55 kg/m2  SpO2 98%      Review of Systems  Constitutional: Negative for fever, chills, appetite change and fatigue.  HENT: Negative for congestion, dental problem, ear pain, hearing loss, sore throat, tinnitus, trouble swallowing and voice change.   Eyes: Negative for pain, discharge and visual disturbance.  Respiratory: Negative for cough, chest tightness, wheezing and stridor.   Cardiovascular: Negative for chest pain, palpitations and leg swelling.  Gastrointestinal: Negative for nausea, vomiting, abdominal pain,  diarrhea, constipation, blood in stool and abdominal distention.  Genitourinary: Negative for urgency, hematuria, flank pain, discharge, difficulty urinating and genital sores.  Musculoskeletal: Negative for arthralgias, back pain, gait problem, joint swelling, myalgias and neck stiffness.  Skin: Negative for rash.  Neurological: Negative for dizziness, syncope, speech difficulty, weakness, numbness and headaches.  Hematological: Negative for adenopathy. Does not bruise/bleed easily.  Psychiatric/Behavioral: Negative for behavioral problems and dysphoric mood. The patient is not nervous/anxious.        Objective:   Physical Exam  Constitutional: He is oriented to person, place, and time. He appears well-developed.  HENT:  Head: Normocephalic.  Right Ear: External ear normal.  Left Ear: External ear normal.  Eyes: Conjunctivae and EOM are normal.  Neck: Normal range of motion.  Cardiovascular: Normal rate and normal heart sounds.   Pulmonary/Chest: Breath sounds normal.  Abdominal: Bowel sounds are normal.  Musculoskeletal: Normal range of motion. He exhibits no edema and no tenderness.  Neurological: He is alert and oriented to person, place, and time.  Psychiatric: He has a normal mood and affect. His behavior is normal.          Assessment & Plan:   Hypertension.  Reasonable control.  Blood pressure 140/90 on repeat here.  He does monitor home blood pressure readings with nice results Dyslipidemia History of gout, stable   CPX 6 months

## 2013-12-24 ENCOUNTER — Telehealth: Payer: Self-pay | Admitting: Internal Medicine

## 2013-12-24 NOTE — Telephone Encounter (Signed)
Relevant patient education mailed to patient.  

## 2014-05-29 ENCOUNTER — Other Ambulatory Visit: Payer: Self-pay | Admitting: Internal Medicine

## 2014-06-09 ENCOUNTER — Ambulatory Visit (INDEPENDENT_AMBULATORY_CARE_PROVIDER_SITE_OTHER): Payer: Medicare Other | Admitting: Internal Medicine

## 2014-06-09 ENCOUNTER — Encounter: Payer: Self-pay | Admitting: Internal Medicine

## 2014-06-09 VITALS — BP 120/80 | HR 55 | Temp 98.0°F | Resp 20 | Ht 73.0 in | Wt 200.0 lb

## 2014-06-09 DIAGNOSIS — Z0000 Encounter for general adult medical examination without abnormal findings: Secondary | ICD-10-CM

## 2014-06-09 DIAGNOSIS — Z860100 Personal history of colon polyps, unspecified: Secondary | ICD-10-CM

## 2014-06-09 DIAGNOSIS — E785 Hyperlipidemia, unspecified: Secondary | ICD-10-CM

## 2014-06-09 DIAGNOSIS — K5730 Diverticulosis of large intestine without perforation or abscess without bleeding: Secondary | ICD-10-CM

## 2014-06-09 DIAGNOSIS — E7800 Pure hypercholesterolemia, unspecified: Secondary | ICD-10-CM

## 2014-06-09 DIAGNOSIS — R7989 Other specified abnormal findings of blood chemistry: Secondary | ICD-10-CM

## 2014-06-09 DIAGNOSIS — I10 Essential (primary) hypertension: Secondary | ICD-10-CM

## 2014-06-09 DIAGNOSIS — M109 Gout, unspecified: Secondary | ICD-10-CM

## 2014-06-09 LAB — COMPREHENSIVE METABOLIC PANEL
ALT: 14 U/L (ref 0–53)
AST: 21 U/L (ref 0–37)
Albumin: 3.7 g/dL (ref 3.5–5.2)
Alkaline Phosphatase: 42 U/L (ref 39–117)
BUN: 19 mg/dL (ref 6–23)
CALCIUM: 8.9 mg/dL (ref 8.4–10.5)
CHLORIDE: 104 meq/L (ref 96–112)
CO2: 29 mEq/L (ref 19–32)
CREATININE: 1.1 mg/dL (ref 0.4–1.5)
GFR: 68.8 mL/min (ref >60.00)
Glucose, Bld: 85 mg/dL (ref 70–99)
Potassium: 4.2 mEq/L (ref 3.5–5.1)
Sodium: 141 mEq/L (ref 135–145)
Total Bilirubin: 1.7 mg/dL — ABNORMAL HIGH (ref 0.2–1.2)
Total Protein: 6.7 g/dL (ref 6.0–8.3)

## 2014-06-09 LAB — CBC WITH DIFFERENTIAL/PLATELET
Basophils Absolute: 0 10*3/uL (ref 0.0–0.1)
Basophils Relative: 0.5 % (ref 0.0–3.0)
Eosinophils Absolute: 0.1 10*3/uL (ref 0.0–0.7)
Eosinophils Relative: 2 % (ref 0.0–5.0)
HCT: 50.3 % (ref 39.0–52.0)
Hemoglobin: 16.7 g/dL (ref 13.0–17.0)
LYMPHS PCT: 34.6 % (ref 12.0–46.0)
Lymphs Abs: 1.7 10*3/uL (ref 0.7–4.0)
MCHC: 33.2 g/dL (ref 30.0–36.0)
MCV: 90.1 fl (ref 78.0–100.0)
MONOS PCT: 11.6 % (ref 3.0–12.0)
Monocytes Absolute: 0.6 10*3/uL (ref 0.1–1.0)
Neutro Abs: 2.5 10*3/uL (ref 1.4–7.7)
Neutrophils Relative %: 51.3 % (ref 43.0–77.0)
Platelets: 182 10*3/uL (ref 150.0–400.0)
RBC: 5.58 Mil/uL (ref 4.22–5.81)
RDW: 13.8 % (ref 11.5–15.5)
WBC: 4.8 10*3/uL (ref 4.0–10.5)

## 2014-06-09 LAB — LIPID PANEL
CHOL/HDL RATIO: 5
CHOLESTEROL: 156 mg/dL (ref 0–200)
HDL: 30.1 mg/dL — AB (ref >39.00)
NonHDL: 125.9
TRIGLYCERIDES: 242 mg/dL — AB (ref 0.0–149.0)
VLDL: 48.4 mg/dL — ABNORMAL HIGH (ref 0.0–40.0)

## 2014-06-09 LAB — LDL CHOLESTEROL, DIRECT: Direct LDL: 76.5 mg/dL

## 2014-06-09 LAB — TSH: TSH: 1.53 u[IU]/mL (ref 0.35–4.50)

## 2014-06-09 NOTE — Progress Notes (Signed)
Pre visit review using our clinic review tool, if applicable. No additional management support is needed unless otherwise documented below in the visit note. 

## 2014-06-09 NOTE — Progress Notes (Signed)
Patient ID: Jose Hood, male   DOB: [birth year], 73 y.o.   MRN: [id number] Patient ID: Jose Hood, male   DOB: [birth year], 73 y.o.   MRN: [id number]  Subjective:    Patient ID: Jose Hood, male    DOB: 1941/04/10, 73 y.o.   MRN: [id number]  Hypertension Pertinent negatives include no chest pain, headaches, neck pain, palpitations or shortness of breath.   CC: cpx - doing well.   History of Present Illness:   73  year-old patient who is seen today for a annual exam.   Medical problems Include colonic polyps, dyslipidemia, and a history of hypertension. He is a history of gout, which has been stable, as well as osteoarthritis. His last colonoscopy was 2011. He was hospitalized in December of 2011 for lymphocytic colitis. He is followed closely by urology  Wt Readings from Last 3 Encounters:  06/09/14 200 lb (90.719 kg)  12/09/13 204 lb (92.534 kg)  06/07/13 204 lb (92.534 kg)    He does have a history of a prior ruptured left tympanic membrane and prior history of otitis externa.  He does have some chronic diminished auditory acuity on the left.   Here for Medicare AWV:  1. Risk factors based on Past M, S, F history: bibasilar risk factors include a history of hypertension, dyslipidemia, and family history of coronary artery disease. The patient has a personal history of colonic polyps.  2. Physical Activities: remains fairly active with daily walking; no exercise limitations.  3. Depression/mood: no history of depression or mood disorder  4. Hearing: diminished auditory acuity on the left  5. ADL's: totally independent in all aspects of daily living  6. Fall Risk: low  7. Home Safety: no problems identified  8. Height, weight, &visual acuity:height and weight are stable. No change in visual acuity  9. Counseling: regular exercise on a heart healthy diet. Encouraged follow-up colonoscopy in 4 years  63. Labs ordered based on risk factors: complete laboratory profile reviewed, including lipid  profile  11. Referral Coordination-not indicated at this time  12. Care Plan- heart healthy diet, and more regular exercise. Encouraged  13. Cognitive Assessment- alert and oriented, with normal affect   Preventive Screening-Counseling & Management  Alcohol-Tobacco  Smoking Status: never   Allergies:  No Known Drug Allergies   Past History:  Past Medical History:   Colonic polyps, hx of  Gout  Hypertension  BPH  Diverticulosis, colon  external hemorrhoids  Hyperlipidemia   Past Surgical History:    Inguinal herniorrhaphy: '89 '01  colonoscopy 2005, 2010, 2011  Family History:   Family History of CAD Male 1st degree relative <60  Family History Kidney disease  Father s/p MI  Mother -Kidney Ca  father died age 91, MI  mother died at 90, renal cancer  One brother-age 4  l&w   Social History:   Never Smoked  Married  son and daughter - 5 grandchildren      Review of Systems  Constitutional: Negative for fever, chills, activity change, appetite change and fatigue.  HENT: Negative for congestion, dental problem, ear pain, hearing loss, mouth sores, rhinorrhea, sinus pressure, sneezing, tinnitus, trouble swallowing and voice change.   Eyes: Negative for photophobia, pain, redness and visual disturbance.  Respiratory: Negative for apnea, cough, choking, chest tightness, shortness of breath and wheezing.   Cardiovascular: Negative for chest pain, palpitations and leg swelling.  Gastrointestinal: Negative for nausea, vomiting, abdominal pain, diarrhea, constipation, blood in stool, abdominal distention, anal bleeding and rectal  pain.  Genitourinary: Negative for dysuria, urgency, frequency, hematuria, flank pain, decreased urine volume, discharge, penile swelling, scrotal swelling, difficulty urinating, genital sores and testicular pain.  Musculoskeletal: Negative for arthralgias, back pain, gait problem, joint swelling, myalgias, neck pain and neck stiffness.  Skin:  Negative for color change, rash and wound.  Neurological: Negative for dizziness, tremors, seizures, syncope, facial asymmetry, speech difficulty, weakness, light-headedness, numbness and headaches.  Hematological: Negative for adenopathy. Does not bruise/bleed easily.  Psychiatric/Behavioral: Negative for suicidal ideas, hallucinations, behavioral problems, confusion, sleep disturbance, self-injury, dysphoric mood, decreased concentration and agitation. The patient is not nervous/anxious.        Objective:   Physical Exam  Constitutional: He appears well-developed and well-nourished.  Blood pressure 120 over 80  HENT:  Head: Normocephalic and atraumatic.  Right Ear: External ear normal.  Left Ear: External ear normal.  Nose: Nose normal.  Mouth/Throat: Oropharynx is clear and moist.  Eyes: Conjunctivae and EOM are normal. Pupils are equal, round, and reactive to light. No scleral icterus.  Neck: Normal range of motion. Neck supple. No JVD present. No thyromegaly present.  Cardiovascular: Regular rhythm, normal heart sounds and intact distal pulses.  Exam reveals no gallop and no friction rub.   No murmur heard. Pulmonary/Chest: Effort normal and breath sounds normal. He exhibits no tenderness.  Abdominal: Soft. Bowel sounds are normal. He exhibits no distension and no mass. There is no tenderness.  Right lower quadrant scar Right inguinal hernia  Genitourinary: Penis normal.  Left hydrocele  Musculoskeletal: Normal range of motion. He exhibits no edema and no tenderness.  Lymphadenopathy:    He has no cervical adenopathy.  Neurological: He is alert. He has normal reflexes. No cranial nerve deficit. Coordination normal.  Skin: Skin is warm and dry. No rash noted.  Psychiatric: He has a normal mood and affect. His behavior is normal.          Assessment & Plan:   Hypertension well controlled Annual health assessment Dyslipidemia  BPH. Followed by  urology Osteoarthritis History of colonic polyps.  Followup colonoscopy 2016 or early 2017   Continue home blood pressure monitoring, a restricted salt diet Recheck one year Review laboratory update

## 2014-06-09 NOTE — Patient Instructions (Signed)
Limit your sodium (Salt) intake  Please check your blood pressure on a regular basis.  If it is consistently greater than 150/90, please make an office appointment.    It is important that you exercise regularly, at least 20 minutes 3 to 4 times per week.  If you develop chest pain or shortness of breath seek  medical attention.  Health Maintenance A healthy lifestyle and preventative care can promote health and wellness.  Maintain regular health, dental, and eye exams.  Eat a healthy diet. Foods like vegetables, fruits, whole grains, low-fat dairy products, and lean protein foods contain the nutrients you need and are low in calories. Decrease your intake of foods high in solid fats, added sugars, and salt. Get information about a proper diet from your health care provider, if necessary.  Regular physical exercise is one of the most important things you can do for your health. Most adults should get at least 150 minutes of moderate-intensity exercise (any activity that increases your heart rate and causes you to sweat) each week. In addition, most adults need muscle-strengthening exercises on 2 or more days a week.   Maintain a healthy weight. The body mass index (BMI) is a screening tool to identify possible weight problems. It provides an estimate of body fat based on height and weight. Your health care provider can find your BMI and can help you achieve or maintain a healthy weight. For males 20 years and older:  A BMI below 18.5 is considered underweight.  A BMI of 18.5 to 24.9 is normal.  A BMI of 25 to 29.9 is considered overweight.  A BMI of 30 and above is considered obese.  Maintain normal blood lipids and cholesterol by exercising and minimizing your intake of saturated fat. Eat a balanced diet with plenty of fruits and vegetables. Blood tests for lipids and cholesterol should begin at age 33 and be repeated every 5 years. If your lipid or cholesterol levels are high, you are  over age 1, or you are at high risk for heart disease, you may need your cholesterol levels checked more frequently.Ongoing high lipid and cholesterol levels should be treated with medicines if diet and exercise are not working.  If you smoke, find out from your health care provider how to quit. If you do not use tobacco, do not start.  Lung cancer screening is recommended for adults aged 27-80 years who are at high risk for developing lung cancer because of a history of smoking. A yearly low-dose CT scan of the lungs is recommended for people who have at least a 30-pack-year history of smoking and are current smokers or have quit within the past 15 years. A pack year of smoking is smoking an average of 1 pack of cigarettes a day for 1 year (for example, a 30-pack-year history of smoking could mean smoking 1 pack a day for 30 years or 2 packs a day for 15 years). Yearly screening should continue until the smoker has stopped smoking for at least 15 years. Yearly screening should be stopped for people who develop a health problem that would prevent them from having lung cancer treatment.  If you choose to drink alcohol, do not have more than 2 drinks per day. One drink is considered to be 12 oz (360 mL) of beer, 5 oz (150 mL) of wine, or 1.5 oz (45 mL) of liquor.  Avoid the use of street drugs. Do not share needles with anyone. Ask for help if you  need support or instructions about stopping the use of drugs.  High blood pressure causes heart disease and increases the risk of stroke. Blood pressure should be checked at least every 1-2 years. Ongoing high blood pressure should be treated with medicines if weight loss and exercise are not effective.  If you are 71-23 years old, ask your health care provider if you should take aspirin to prevent heart disease.  Diabetes screening involves taking a blood sample to check your fasting blood sugar level. This should be done once every 3 years after age 68 if  you are at a normal weight and without risk factors for diabetes. Testing should be considered at a younger age or be carried out more frequently if you are overweight and have at least 1 risk factor for diabetes.  Colorectal cancer can be detected and often prevented. Most routine colorectal cancer screening begins at the age of 35 and continues through age 66. However, your health care provider may recommend screening at an earlier age if you have risk factors for colon cancer. On a yearly basis, your health care provider may provide home test kits to check for hidden blood in the stool. A small camera at the end of a tube may be used to directly examine the colon (sigmoidoscopy or colonoscopy) to detect the earliest forms of colorectal cancer. Talk to your health care provider about this at age 57 when routine screening begins. A direct exam of the colon should be repeated every 5-10 years through age 57, unless early forms of precancerous polyps or small growths are found.  People who are at an increased risk for hepatitis B should be screened for this virus. You are considered at high risk for hepatitis B if:  You were born in a country where hepatitis B occurs often. Talk with your health care provider about which countries are considered high risk.  Your parents were born in a high-risk country and you have not received a shot to protect against hepatitis B (hepatitis B vaccine).  You have HIV or AIDS.  You use needles to inject street drugs.  You live with, or have sex with, someone who has hepatitis B.  You are a man who has sex with other men (MSM).  You get hemodialysis treatment.  You take certain medicines for conditions like cancer, organ transplantation, and autoimmune conditions.  Hepatitis C blood testing is recommended for all people born from [birth year] through 1965 and any individual with known risk factors for hepatitis C.  Healthy men should no longer receive prostate-specific  antigen (PSA) blood tests as part of routine cancer screening. Talk to your health care provider about prostate cancer screening.  Testicular cancer screening is not recommended for adolescents or adult males who have no symptoms. Screening includes self-exam, a health care provider exam, and other screening tests. Consult with your health care provider about any symptoms you have or any concerns you have about testicular cancer.  Practice safe sex. Use condoms and avoid high-risk sexual practices to reduce the spread of sexually transmitted infections (STIs).  You should be screened for STIs, including gonorrhea and chlamydia if:  You are sexually active and are younger than 24 years.  You are older than 24 years, and your health care provider tells you that you are at risk for this type of infection.  Your sexual activity has changed since you were last screened, and you are at an increased risk for chlamydia or gonorrhea. Ask your health  care provider if you are at risk.  If you are at risk of being infected with HIV, it is recommended that you take a prescription medicine daily to prevent HIV infection. This is called pre-exposure prophylaxis (PrEP). You are considered at risk if:  You are a man who has sex with other men (MSM).  You are a heterosexual man who is sexually active with multiple partners.  You take drugs by injection.  You are sexually active with a partner who has HIV.  Talk with your health care provider about whether you are at high risk of being infected with HIV. If you choose to begin PrEP, you should first be tested for HIV. You should then be tested every 3 months for as long as you are taking PrEP.  Use sunscreen. Apply sunscreen liberally and repeatedly throughout the day. You should seek shade when your shadow is shorter than you. Protect yourself by wearing long sleeves, pants, a wide-brimmed hat, and sunglasses year round whenever you are outdoors.  Tell  your health care provider of new moles or changes in moles, especially if there is a change in shape or color. Also, tell your health care provider if a mole is larger than the size of a pencil eraser.  A one-time screening for abdominal aortic aneurysm (AAA) and surgical repair of large AAAs by ultrasound is recommended for men aged 23-75 years who are current or former smokers.  Stay current with your vaccines (immunizations). Document Released: 03/31/2008 Document Revised: 10/08/2013 Document Reviewed: 02/28/2011 [facility] Patient Information 2015 [place], [place]. This information is not intended to replace advice given to you by your health care provider. Make sure you discuss any questions you have with your health care provider.

## 2014-07-10 ENCOUNTER — Other Ambulatory Visit: Payer: Self-pay | Admitting: Internal Medicine

## 2014-12-17 ENCOUNTER — Other Ambulatory Visit: Payer: Self-pay | Admitting: Internal Medicine

## 2015-03-23 DIAGNOSIS — N400 Enlarged prostate without lower urinary tract symptoms: Secondary | ICD-10-CM | POA: Diagnosis not present

## 2015-03-23 DIAGNOSIS — R3915 Urgency of urination: Secondary | ICD-10-CM | POA: Diagnosis not present

## 2015-05-21 DIAGNOSIS — Z85828 Personal history of other malignant neoplasm of skin: Secondary | ICD-10-CM | POA: Diagnosis not present

## 2015-05-21 DIAGNOSIS — D2271 Melanocytic nevi of right lower limb, including hip: Secondary | ICD-10-CM | POA: Diagnosis not present

## 2015-05-21 DIAGNOSIS — D1801 Hemangioma of skin and subcutaneous tissue: Secondary | ICD-10-CM | POA: Diagnosis not present

## 2015-05-21 DIAGNOSIS — L821 Other seborrheic keratosis: Secondary | ICD-10-CM | POA: Diagnosis not present

## 2015-05-21 DIAGNOSIS — D2272 Melanocytic nevi of left lower limb, including hip: Secondary | ICD-10-CM | POA: Diagnosis not present

## 2015-06-10 ENCOUNTER — Encounter: Payer: Self-pay | Admitting: Internal Medicine

## 2015-06-10 ENCOUNTER — Ambulatory Visit (INDEPENDENT_AMBULATORY_CARE_PROVIDER_SITE_OTHER): Payer: Medicare Other | Admitting: Internal Medicine

## 2015-06-10 VITALS — BP 126/80 | HR 64 | Temp 97.9°F | Resp 20 | Ht 72.5 in | Wt 197.0 lb

## 2015-06-10 DIAGNOSIS — E785 Hyperlipidemia, unspecified: Secondary | ICD-10-CM | POA: Diagnosis not present

## 2015-06-10 DIAGNOSIS — I10 Essential (primary) hypertension: Secondary | ICD-10-CM | POA: Diagnosis not present

## 2015-06-10 DIAGNOSIS — Z0000 Encounter for general adult medical examination without abnormal findings: Secondary | ICD-10-CM

## 2015-06-10 DIAGNOSIS — Z860100 Personal history of colon polyps, unspecified: Secondary | ICD-10-CM

## 2015-06-10 LAB — CBC WITH DIFFERENTIAL/PLATELET
Basophils Absolute: 0 10*3/uL (ref 0.0–0.1)
Basophils Relative: 0.4 % (ref 0.0–3.0)
EOS ABS: 0.1 10*3/uL (ref 0.0–0.7)
EOS PCT: 2.3 % (ref 0.0–5.0)
HCT: 51.7 % (ref 39.0–52.0)
Hemoglobin: 17.5 g/dL — ABNORMAL HIGH (ref 13.0–17.0)
LYMPHS ABS: 1.6 10*3/uL (ref 0.7–4.0)
Lymphocytes Relative: 32.5 % (ref 12.0–46.0)
MCHC: 34 g/dL (ref 30.0–36.0)
MCV: 89.5 fl (ref 78.0–100.0)
MONO ABS: 0.6 10*3/uL (ref 0.1–1.0)
Monocytes Relative: 12.5 % — ABNORMAL HIGH (ref 3.0–12.0)
Neutro Abs: 2.6 10*3/uL (ref 1.4–7.7)
Neutrophils Relative %: 52.3 % (ref 43.0–77.0)
Platelets: 174 10*3/uL (ref 150.0–400.0)
RBC: 5.77 Mil/uL (ref 4.22–5.81)
RDW: 14.1 % (ref 11.5–15.5)
WBC: 4.9 10*3/uL (ref 4.0–10.5)

## 2015-06-10 LAB — COMPREHENSIVE METABOLIC PANEL
ALBUMIN: 4 g/dL (ref 3.5–5.2)
ALK PHOS: 48 U/L (ref 39–117)
ALT: 15 U/L (ref 0–53)
AST: 20 U/L (ref 0–37)
BUN: 25 mg/dL — AB (ref 6–23)
CO2: 32 mEq/L (ref 19–32)
CREATININE: 1.22 mg/dL (ref 0.40–1.50)
Calcium: 9.7 mg/dL (ref 8.4–10.5)
Chloride: 102 mEq/L (ref 96–112)
GFR: 61.53 mL/min (ref >60.00)
GLUCOSE: 61 mg/dL — AB (ref 70–99)
POTASSIUM: 4.7 meq/L (ref 3.5–5.1)
Sodium: 139 mEq/L (ref 135–145)
TOTAL PROTEIN: 6.8 g/dL (ref 6.0–8.3)
Total Bilirubin: 1.4 mg/dL — ABNORMAL HIGH (ref 0.2–1.2)

## 2015-06-10 LAB — TSH: TSH: 0.71 u[IU]/mL (ref 0.35–4.50)

## 2015-06-10 MED ORDER — LISINOPRIL 40 MG PO TABS: 40.0000 mg | ORAL_TABLET | Freq: Every day | ORAL | Status: DC

## 2015-06-10 MED ORDER — PROBENECID 500 MG PO TABS: 500.0000 mg | ORAL_TABLET | Freq: Every day | ORAL | Status: DC

## 2015-06-10 MED ORDER — FINASTERIDE 5 MG PO TABS: 5.0000 mg | ORAL_TABLET | Freq: Every day | ORAL | Status: DC

## 2015-06-10 MED ORDER — PROPRANOLOL HCL ER 160 MG PO CP24: 160.0000 mg | ORAL_CAPSULE | Freq: Every day | ORAL | Status: DC

## 2015-06-10 MED ORDER — TAMSULOSIN HCL 0.4 MG PO CAPS: 0.4000 mg | ORAL_CAPSULE | Freq: Every day | ORAL | Status: DC

## 2015-06-10 NOTE — Patient Instructions (Signed)
Limit your sodium (Salt) intake    It is important that you exercise regularly, at least 20 minutes 3 to 4 times per week.  If you develop chest pain or shortness of breath seek  medical attention.  Please check your blood pressure on a regular basis.  If it is consistently greater than 150/90, please make an office appointment.  Return in one year for follow-up  Health Maintenance A healthy lifestyle and preventative care can promote health and wellness.  Maintain regular health, dental, and eye exams.  Eat a healthy diet. Foods like vegetables, fruits, whole grains, low-fat dairy products, and lean protein foods contain the nutrients you need and are low in calories. Decrease your intake of foods high in solid fats, added sugars, and salt. Get information about a proper diet from your health care provider, if necessary.  Regular physical exercise is one of the most important things you can do for your health. Most adults should get at least 150 minutes of moderate-intensity exercise (any activity that increases your heart rate and causes you to sweat) each week. In addition, most adults need muscle-strengthening exercises on 2 or more days a week.   Maintain a healthy weight. The body mass index (BMI) is a screening tool to identify possible weight problems. It provides an estimate of body fat based on height and weight. Your health care provider can find your BMI and can help you achieve or maintain a healthy weight. For males 20 years and older:  A BMI below 18.5 is considered underweight.  A BMI of 18.5 to 24.9 is normal.  A BMI of 25 to 29.9 is considered overweight.  A BMI of 30 and above is considered obese.  Maintain normal blood lipids and cholesterol by exercising and minimizing your intake of saturated fat. Eat a balanced diet with plenty of fruits and vegetables. Blood tests for lipids and cholesterol should begin at age 25 and be repeated every 5 years. If your lipid or  cholesterol levels are high, you are over age 10, or you are at high risk for heart disease, you may need your cholesterol levels checked more frequently.Ongoing high lipid and cholesterol levels should be treated with medicines if diet and exercise are not working.  If you smoke, find out from your health care provider how to quit. If you do not use tobacco, do not start.  Lung cancer screening is recommended for adults aged 59-80 years who are at high risk for developing lung cancer because of a history of smoking. A yearly low-dose CT scan of the lungs is recommended for people who have at least a 30-pack-year history of smoking and are current smokers or have quit within the past 15 years. A pack year of smoking is smoking an average of 1 pack of cigarettes a day for 1 year (for example, a 30-pack-year history of smoking could mean smoking 1 pack a day for 30 years or 2 packs a day for 15 years). Yearly screening should continue until the smoker has stopped smoking for at least 15 years. Yearly screening should be stopped for people who develop a health problem that would prevent them from having lung cancer treatment.  If you choose to drink alcohol, do not have more than 2 drinks per day. One drink is considered to be 12 oz (360 mL) of beer, 5 oz (150 mL) of wine, or 1.5 oz (45 mL) of liquor.  Avoid the use of street drugs. Do not share needles  with anyone. Ask for help if you need support or instructions about stopping the use of drugs.  High blood pressure causes heart disease and increases the risk of stroke. Blood pressure should be checked at least every 1-2 years. Ongoing high blood pressure should be treated with medicines if weight loss and exercise are not effective.  If you are 39-34 years old, ask your health care provider if you should take aspirin to prevent heart disease.  Diabetes screening involves taking a blood sample to check your fasting blood sugar level. This should be done  once every 3 years after age 82 if you are at a normal weight and without risk factors for diabetes. Testing should be considered at a younger age or be carried out more frequently if you are overweight and have at least 1 risk factor for diabetes.  Colorectal cancer can be detected and often prevented. Most routine colorectal cancer screening begins at the age of 21 and continues through age 27. However, your health care provider may recommend screening at an earlier age if you have risk factors for colon cancer. On a yearly basis, your health care provider may provide home test kits to check for hidden blood in the stool. A small camera at the end of a tube may be used to directly examine the colon (sigmoidoscopy or colonoscopy) to detect the earliest forms of colorectal cancer. Talk to your health care provider about this at age 28 when routine screening begins. A direct exam of the colon should be repeated every 5-10 years through age 52, unless early forms of precancerous polyps or small growths are found.  People who are at an increased risk for hepatitis B should be screened for this virus. You are considered at high risk for hepatitis B if:  You were born in a country where hepatitis B occurs often. Talk with your health care provider about which countries are considered high risk.  Your parents were born in a high-risk country and you have not received a shot to protect against hepatitis B (hepatitis B vaccine).  You have HIV or AIDS.  You use needles to inject street drugs.  You live with, or have sex with, someone who has hepatitis B.  You are a man who has sex with other men (MSM).  You get hemodialysis treatment.  You take certain medicines for conditions like cancer, organ transplantation, and autoimmune conditions.  Hepatitis C blood testing is recommended for all people born from [birth year] through 1966 and any individual with known risk factors for hepatitis C.  Healthy men should  no longer receive prostate-specific antigen (PSA) blood tests as part of routine cancer screening. Talk to your health care provider about prostate cancer screening.  Testicular cancer screening is not recommended for adolescents or adult males who have no symptoms. Screening includes self-exam, a health care provider exam, and other screening tests. Consult with your health care provider about any symptoms you have or any concerns you have about testicular cancer.  Practice safe sex. Use condoms and avoid high-risk sexual practices to reduce the spread of sexually transmitted infections (STIs).  You should be screened for STIs, including gonorrhea and chlamydia if:  You are sexually active and are younger than 24 years.  You are older than 24 years, and your health care provider tells you that you are at risk for this type of infection.  Your sexual activity has changed since you were last screened, and you are at an increased risk  for chlamydia or gonorrhea. Ask your health care provider if you are at risk.  If you are at risk of being infected with HIV, it is recommended that you take a prescription medicine daily to prevent HIV infection. This is called pre-exposure prophylaxis (PrEP). You are considered at risk if:  You are a man who has sex with other men (MSM).  You are a heterosexual man who is sexually active with multiple partners.  You take drugs by injection.  You are sexually active with a partner who has HIV.  Talk with your health care provider about whether you are at high risk of being infected with HIV. If you choose to begin PrEP, you should first be tested for HIV. You should then be tested every 3 months for as long as you are taking PrEP.  Use sunscreen. Apply sunscreen liberally and repeatedly throughout the day. You should seek shade when your shadow is shorter than you. Protect yourself by wearing long sleeves, pants, a wide-brimmed hat, and sunglasses year round  whenever you are outdoors.  Tell your health care provider of new moles or changes in moles, especially if there is a change in shape or color. Also, tell your health care provider if a mole is larger than the size of a pencil eraser.  A one-time screening for abdominal aortic aneurysm (AAA) and surgical repair of large AAAs by ultrasound is recommended for men aged 35-75 years who are current or former smokers.  Stay current with your vaccines (immunizations). Document Released: 03/31/2008 Document Revised: 10/08/2013 Document Reviewed: 02/28/2011 [facility] Patient Information 2015 [place], [place]. This information is not intended to replace advice given to you by your health care provider. Make sure you discuss any questions you have with your health care provider.

## 2015-06-10 NOTE — Progress Notes (Signed)
Pre visit review using our clinic review tool, if applicable. No additional management support is needed unless otherwise documented below in the visit note. 

## 2015-06-10 NOTE — Progress Notes (Signed)
Patient ID: Jose Hood, male   DOB: 10/11/1941, 74 y.o.   MRN: [id number] Patient ID: Jose Hood, male   DOB: 1941-02-08, 74 y.o.   MRN: [id number]  Subjective:    Patient ID: Jose Hood, male    DOB: 07-12-1941, 74 y.o.   MRN: [id number]  Hypertension Pertinent negatives include no chest pain, headaches, neck pain, palpitations or shortness of breath.   CC: cpx - doing well.   History of Present Illness:   74   year-old patient who is seen today for a annual exam.   Medical problems Include colonic polyps, dyslipidemia, and a history of hypertension. He is a history of gout, which has been stable, as well as osteoarthritis. His last colonoscopy was 2011. He was hospitalized in December of 2011 for lymphocytic colitis. He is followed closely by urology  Wt Readings from Last 3 Encounters:  06/10/15 197 lb (89.359 kg)  06/09/14 200 lb (90.719 kg)  12/09/13 204 lb (92.534 kg)    He does have a history of a prior ruptured left tympanic membrane and prior history of otitis externa.  He does have some chronic diminished auditory acuity on the left.   Here for Medicare AWV:  1. Risk factors based on Past M, S, F history: bibasilar risk factors include a history of hypertension, dyslipidemia, and family history of coronary artery disease. The patient has a personal history of colonic polyps.  2. Physical Activities: remains fairly active with daily walking; no exercise limitations.  3. Depression/mood: no history of depression or mood disorder  4. Hearing: diminished auditory acuity on the left  5. ADL's: totally independent in all aspects of daily living  6. Fall Risk: low  7. Home Safety: no problems identified  8. Height, weight, &visual acuity:height and weight are stable. No change in visual acuity  9. Counseling: regular exercise on a heart healthy diet. Encouraged follow-up colonoscopy in 4 years  70. Labs ordered based on risk factors: complete laboratory profile reviewed, including lipid  profile  11. Referral Coordination-not indicated at this time  12. Care Plan- heart healthy diet, and more regular exercise. Encouraged  13. Cognitive Assessment- alert and oriented, with normal affect  14.  Preventive services will include an annual health examination with screening lab.  Annual eye examinations.  Encouraged.  He will be consider for a final screening colonoscopy next year due to his history of colonic polyps 15.  Provider list includes urology primary care medicine GI and ophthalmology  Preventive Screening-Counseling & Management  Alcohol-Tobacco  Smoking Status: never   Allergies:  No Known Drug Allergies   Past History:  Past Medical History:   Colonic polyps, hx of  Gout  Hypertension  BPH  Diverticulosis, colon  external hemorrhoids  Hyperlipidemia   Past Surgical History:    Inguinal herniorrhaphy: '89 '01  colonoscopy 2005, 2010, 2011  Family History:   Family History of CAD Male 1st degree relative <60  Family History Kidney disease  Father s/p MI  Mother -Kidney Ca  father died age 29, MI  mother died at 64, renal cancer  One brother-age 18  l&w   Social History:   Never Smoked  Married  son and daughter - 5 grandchildren      Review of Systems  Constitutional: Negative for fever, chills, activity change, appetite change and fatigue.  HENT: Negative for congestion, dental problem, ear pain, hearing loss, mouth sores, rhinorrhea, sinus pressure, sneezing, tinnitus, trouble swallowing and voice change.   Eyes: Negative  for photophobia, pain, redness and visual disturbance.  Respiratory: Negative for apnea, cough, choking, chest tightness, shortness of breath and wheezing.   Cardiovascular: Negative for chest pain, palpitations and leg swelling.  Gastrointestinal: Negative for nausea, vomiting, abdominal pain, diarrhea, constipation, blood in stool, abdominal distention, anal bleeding and rectal pain.  Genitourinary: Negative for  dysuria, urgency, frequency, hematuria, flank pain, decreased urine volume, discharge, penile swelling, scrotal swelling, difficulty urinating, genital sores and testicular pain.  Musculoskeletal: Negative for myalgias, back pain, joint swelling, arthralgias, gait problem, neck pain and neck stiffness.  Skin: Negative for color change, rash and wound.  Neurological: Negative for dizziness, tremors, seizures, syncope, facial asymmetry, speech difficulty, weakness, light-headedness, numbness and headaches.  Hematological: Negative for adenopathy. Does not bruise/bleed easily.  Psychiatric/Behavioral: Negative for suicidal ideas, hallucinations, behavioral problems, confusion, sleep disturbance, self-injury, dysphoric mood, decreased concentration and agitation. The patient is not nervous/anxious.        Objective:   Physical Exam  Constitutional: He appears well-developed and well-nourished.  Blood pressure 120 over 80  HENT:  Head: Normocephalic and atraumatic.  Right Ear: External ear normal.  Left Ear: External ear normal.  Nose: Nose normal.  Mouth/Throat: Oropharynx is clear and moist.  Healed rupture, left tympanic membrane  Eyes: Conjunctivae and EOM are normal. Pupils are equal, round, and reactive to light. No scleral icterus.  Neck: Normal range of motion. Neck supple. No JVD present. No thyromegaly present.  Cardiovascular: Regular rhythm, normal heart sounds and intact distal pulses.  Exam reveals no gallop and no friction rub.   No murmur heard. Pulmonary/Chest: Effort normal and breath sounds normal. He exhibits no tenderness.  Abdominal: Soft. Bowel sounds are normal. He exhibits no distension and no mass. There is no tenderness.  Right lower quadrant scar Right inguinal hernia  Genitourinary: Penis normal.  Left hydrocele Uncircumcised  Musculoskeletal: Normal range of motion. He exhibits no edema or tenderness.  Lymphadenopathy:    He has no cervical adenopathy.   Neurological: He is alert. He has normal reflexes. No cranial nerve deficit. Coordination normal.  Skin: Skin is warm and dry. No rash noted.  Psychiatric: He has a normal mood and affect. His behavior is normal.          Assessment & Plan:   Hypertension well controlled Annual health assessment Dyslipidemia  BPH. Followed by urology Osteoarthritis History of colonic polyps.  Followup colonoscopy will be considered next year   Continue home blood pressure monitoring, a restricted salt diet Recheck one year Review laboratory update

## 2015-06-20 ENCOUNTER — Other Ambulatory Visit: Payer: Self-pay | Admitting: Internal Medicine

## 2015-07-06 DIAGNOSIS — H40013 Open angle with borderline findings, low risk, bilateral: Secondary | ICD-10-CM | POA: Diagnosis not present

## 2015-07-06 DIAGNOSIS — H25813 Combined forms of age-related cataract, bilateral: Secondary | ICD-10-CM | POA: Diagnosis not present

## 2015-09-18 ENCOUNTER — Other Ambulatory Visit: Payer: Self-pay | Admitting: Internal Medicine

## 2015-12-04 ENCOUNTER — Encounter: Payer: Self-pay | Admitting: Internal Medicine

## 2015-12-18 ENCOUNTER — Other Ambulatory Visit: Payer: Self-pay | Admitting: Internal Medicine

## 2016-03-16 ENCOUNTER — Other Ambulatory Visit: Payer: Self-pay | Admitting: Internal Medicine

## 2016-06-07 ENCOUNTER — Encounter: Payer: Self-pay | Admitting: Internal Medicine

## 2016-06-07 DIAGNOSIS — L821 Other seborrheic keratosis: Secondary | ICD-10-CM | POA: Diagnosis not present

## 2016-06-07 DIAGNOSIS — Z85828 Personal history of other malignant neoplasm of skin: Secondary | ICD-10-CM | POA: Diagnosis not present

## 2016-06-07 DIAGNOSIS — C44612 Basal cell carcinoma of skin of right upper limb, including shoulder: Secondary | ICD-10-CM | POA: Diagnosis not present

## 2016-06-07 DIAGNOSIS — D2272 Melanocytic nevi of left lower limb, including hip: Secondary | ICD-10-CM | POA: Diagnosis not present

## 2016-06-07 DIAGNOSIS — L570 Actinic keratosis: Secondary | ICD-10-CM | POA: Diagnosis not present

## 2016-06-13 ENCOUNTER — Ambulatory Visit (INDEPENDENT_AMBULATORY_CARE_PROVIDER_SITE_OTHER): Payer: Medicare Other | Admitting: Internal Medicine

## 2016-06-13 ENCOUNTER — Encounter: Payer: Self-pay | Admitting: Internal Medicine

## 2016-06-13 VITALS — BP 124/80 | HR 62 | Temp 98.1°F | Resp 20 | Ht 72.25 in | Wt 192.5 lb

## 2016-06-13 DIAGNOSIS — Z0000 Encounter for general adult medical examination without abnormal findings: Secondary | ICD-10-CM

## 2016-06-13 DIAGNOSIS — I10 Essential (primary) hypertension: Secondary | ICD-10-CM

## 2016-06-13 DIAGNOSIS — E7800 Pure hypercholesterolemia, unspecified: Secondary | ICD-10-CM

## 2016-06-13 DIAGNOSIS — Z86010 Personal history of colonic polyps: Secondary | ICD-10-CM

## 2016-06-13 LAB — CBC WITH DIFFERENTIAL/PLATELET
BASOS ABS: 0 10*3/uL (ref 0.0–0.1)
Basophils Relative: 0.3 % (ref 0.0–3.0)
EOS ABS: 0.1 10*3/uL (ref 0.0–0.7)
Eosinophils Relative: 2 % (ref 0.0–5.0)
HEMATOCRIT: 52.6 % — AB (ref 39.0–52.0)
HEMOGLOBIN: 17.7 g/dL — AB (ref 13.0–17.0)
LYMPHS PCT: 32.4 % (ref 12.0–46.0)
Lymphs Abs: 1.6 10*3/uL (ref 0.7–4.0)
MCHC: 33.7 g/dL (ref 30.0–36.0)
MCV: 88.5 fl (ref 78.0–100.0)
Monocytes Absolute: 0.5 10*3/uL (ref 0.1–1.0)
Monocytes Relative: 9.7 % (ref 3.0–12.0)
NEUTROS ABS: 2.7 10*3/uL (ref 1.4–7.7)
Neutrophils Relative %: 55.6 % (ref 43.0–77.0)
PLATELETS: 181 10*3/uL (ref 150.0–400.0)
RBC: 5.95 Mil/uL — AB (ref 4.22–5.81)
RDW: 13.9 % (ref 11.5–15.5)
WBC: 4.9 10*3/uL (ref 4.0–10.5)

## 2016-06-13 LAB — COMPREHENSIVE METABOLIC PANEL
ALBUMIN: 4 g/dL (ref 3.5–5.2)
ALK PHOS: 48 U/L (ref 39–117)
ALT: 14 U/L (ref 0–53)
AST: 17 U/L (ref 0–37)
BILIRUBIN TOTAL: 1 mg/dL (ref 0.2–1.2)
BUN: 18 mg/dL (ref 6–23)
CALCIUM: 9 mg/dL (ref 8.4–10.5)
CO2: 32 mEq/L (ref 19–32)
CREATININE: 1.14 mg/dL (ref 0.40–1.50)
Chloride: 103 mEq/L (ref 96–112)
GFR: 66.35 mL/min (ref >60.00)
Glucose, Bld: 101 mg/dL — ABNORMAL HIGH (ref 70–99)
Potassium: 4.6 mEq/L (ref 3.5–5.1)
Sodium: 141 mEq/L (ref 135–145)
TOTAL PROTEIN: 6.6 g/dL (ref 6.0–8.3)

## 2016-06-13 LAB — LIPID PANEL
CHOLESTEROL: 166 mg/dL (ref 0–200)
HDL: 34.6 mg/dL — ABNORMAL LOW (ref >39.00)
NonHDL: 131.11
Total CHOL/HDL Ratio: 5
Triglycerides: 236 mg/dL — ABNORMAL HIGH (ref 0.0–149.0)
VLDL: 47.2 mg/dL — ABNORMAL HIGH (ref 0.0–40.0)

## 2016-06-13 LAB — LDL CHOLESTEROL, DIRECT: Direct LDL: 74 mg/dL

## 2016-06-13 LAB — TSH: TSH: 1.29 u[IU]/mL (ref 0.35–4.50)

## 2016-06-13 NOTE — Progress Notes (Signed)
Patient ID: Jose Hood, male   DOB: [birth year], 75 y.o.   MRN: [id number] Patient ID: Jose Hood, male   DOB: [birth year], 75 y.o.   MRN: [id number]  Subjective:    Patient ID: Jose Hood, male    DOB: [birth year], 75 y.o.   MRN: [id number]  Hypertension  Pertinent negatives include no chest pain, headaches, neck pain, palpitations or shortness of breath.   CC: cpx - doing well.   History of Present Illness:   75   year-old patient who is seen today for a annual exam.   Medical problems Include colonic polyps, dyslipidemia, and a history of hypertension. He is a history of gout, which has been stable, as well as osteoarthritis. His last colonoscopy was January 2012.  No polyps were noted at that time, although confirmed microscopic colitis.  He was hospitalized in December of 2011 for lymphocytic colitis. He is followed closely by urology  Wt Readings from Last 3 Encounters:  06/13/16 192 lb 8 oz (87.3 kg)  06/10/15 197 lb (89.4 kg)  06/09/14 200 lb (90.7 kg)    He does have a history of a prior ruptured left tympanic membrane and prior history of otitis externa.  He does have some chronic diminished auditory acuity on the left.   Here for Medicare AWV:  1. Risk factors based on Past M, S, F history: bibasilar risk factors include a history of hypertension, dyslipidemia, and family history of coronary artery disease. The patient has a personal history of colonic polyps.  2. Physical Activities: remains fairly active with daily walking; no exercise limitations.  3. Depression/mood: no history of depression or mood disorder  4. Hearing: diminished auditory acuity on the left  5. ADL's: totally independent in all aspects of daily living  6. Fall Risk: low  7. Home Safety: no problems identified  8. Height, weight, &visual acuity:height and weight are stable. No change in visual acuity  9. Counseling: regular exercise on a heart healthy diet. Encouraged follow-up colonoscopy in 4 years  59. Labs  ordered based on risk factors: complete laboratory profile reviewed, including lipid profile  11. Referral Coordination-not indicated at this time  12. Care Plan- heart healthy diet, and more regular exercise. Encouraged  13. Cognitive Assessment- alert and oriented, with normal affect  14.  Preventive services will include an annual health examination with screening lab.  Annual eye examinations.  Encouraged.  He will be consider for a final screening colonoscopy next year due to his history of colonic polyps 15.  Provider list include primary care urology, ophthalmology and dermatology  Preventive Screening-Counseling & Management  Alcohol-Tobacco  Smoking Status: never   Allergies:  No Known Drug Allergies   Past History:  Past Medical History:   Colonic polyps, hx of  Gout  Hypertension  BPH  Diverticulosis, colon  external hemorrhoids  Hyperlipidemia   Past Surgical History:    Inguinal herniorrhaphy: '89 '01  colonoscopy 2005, 2010, 2012  Family History:   Family History of CAD Male 1st degree relative <60  Family History Kidney disease  Father s/p MI  Mother -Kidney Ca  father died age 81, MI  mother died at 64, renal cancer  One brother-age 50  l&w   Social History:   Never Smoked  Married  son and daughter - 5 grandchildren      Review of Systems  Constitutional: Negative for activity change, appetite change, chills, fatigue and fever.  HENT: Negative for congestion, dental problem, ear pain, hearing loss,  mouth sores, rhinorrhea, sinus pressure, sneezing, tinnitus, trouble swallowing and voice change.   Eyes: Negative for photophobia, pain, redness and visual disturbance.  Respiratory: Negative for apnea, cough, choking, chest tightness, shortness of breath and wheezing.   Cardiovascular: Negative for chest pain, palpitations and leg swelling.  Gastrointestinal: Negative for abdominal distention, abdominal pain, anal bleeding, blood in stool,  constipation, diarrhea, nausea, rectal pain and vomiting.  Genitourinary: Negative for decreased urine volume, difficulty urinating, discharge, dysuria, flank pain, frequency, genital sores, hematuria, penile swelling, scrotal swelling, testicular pain and urgency.  Musculoskeletal: Negative for arthralgias, back pain, gait problem, joint swelling, myalgias, neck pain and neck stiffness.  Skin: Negative for color change, rash and wound.  Neurological: Negative for dizziness, tremors, seizures, syncope, facial asymmetry, speech difficulty, weakness, light-headedness, numbness and headaches.  Hematological: Negative for adenopathy. Does not bruise/bleed easily.  Psychiatric/Behavioral: Negative for agitation, behavioral problems, confusion, decreased concentration, dysphoric mood, hallucinations, self-injury, sleep disturbance and suicidal ideas. The patient is not nervous/anxious.        Objective:   Physical Exam  Constitutional: He appears well-developed and well-nourished.  Blood pressure 140/80  HENT:  Head: Normocephalic and atraumatic.  Right Ear: External ear normal.  Left Ear: External ear normal.  Nose: Nose normal.  Mouth/Throat: Oropharynx is clear and moist.  Healed rupture, left tympanic membrane  Eyes: Conjunctivae and EOM are normal. Pupils are equal, round, and reactive to light. No scleral icterus.  Neck: Normal range of motion. Neck supple. No JVD present. No thyromegaly present.  Cardiovascular: Regular rhythm, normal heart sounds and intact distal pulses.  Exam reveals no gallop and no friction rub.   No murmur heard. Dorsalis pedis pulses full Posterior tibial pulses faint  Pulmonary/Chest: Effort normal and breath sounds normal. He exhibits no tenderness.  Abdominal: Soft. Bowel sounds are normal. He exhibits no distension and no mass. There is no tenderness.  Right lower quadrant scar Right inguinal hernia  Genitourinary: Penis normal.  Genitourinary Comments:  Left hydrocele Uncircumcised  Musculoskeletal: Normal range of motion. He exhibits no edema or tenderness.  Lymphadenopathy:    He has no cervical adenopathy.  Neurological: He is alert. He has normal reflexes. No cranial nerve deficit. Coordination normal.  Skin: Skin is warm and dry. No rash noted.  Psychiatric: He has a normal mood and affect. His behavior is normal.          Assessment & Plan:   Hypertension well controlled Annual health assessment/ subsequent  annual wellness exam Dyslipidemia  BPH. Followed by urology Osteoarthritis History of colonic polyps.  Followup colonoscopy will be scheduled   Continue home blood pressure monitoring, a restricted salt diet Recheck one year Review laboratory update  Nyoka Cowden, MD

## 2016-06-13 NOTE — Progress Notes (Signed)
Pre visit review using our clinic review tool, if applicable. No additional management support is needed unless otherwise documented below in the visit note. 

## 2016-06-13 NOTE — Patient Instructions (Signed)
Limit your sodium (Salt) intake    It is important that you exercise regularly, at least 20 minutes 3 to 4 times per week.  If you develop chest pain or shortness of breath seek  medical attention.  Please check your blood pressure on a regular basis.  If it is consistently greater than 150/90, please make an office appointment.  Return in one year for follow-up  Please see your eye doctor yearly

## 2016-07-06 ENCOUNTER — Other Ambulatory Visit: Payer: Self-pay | Admitting: Internal Medicine

## 2016-07-13 ENCOUNTER — Other Ambulatory Visit: Payer: Self-pay | Admitting: Internal Medicine

## 2016-07-14 DIAGNOSIS — H2513 Age-related nuclear cataract, bilateral: Secondary | ICD-10-CM | POA: Diagnosis not present

## 2016-07-14 DIAGNOSIS — H25043 Posterior subcapsular polar age-related cataract, bilateral: Secondary | ICD-10-CM | POA: Diagnosis not present

## 2016-07-14 DIAGNOSIS — H40013 Open angle with borderline findings, low risk, bilateral: Secondary | ICD-10-CM | POA: Diagnosis not present

## 2016-07-20 ENCOUNTER — Encounter: Payer: Self-pay | Admitting: Family Medicine

## 2016-07-20 ENCOUNTER — Ambulatory Visit (INDEPENDENT_AMBULATORY_CARE_PROVIDER_SITE_OTHER): Payer: Medicare Other | Admitting: Family Medicine

## 2016-07-20 VITALS — BP 112/70 | HR 80 | Temp 98.2°F | Ht 72.25 in | Wt 192.0 lb

## 2016-07-20 DIAGNOSIS — B029 Zoster without complications: Secondary | ICD-10-CM | POA: Diagnosis not present

## 2016-07-20 HISTORY — DX: Zoster without complications: B02.9

## 2016-07-20 MED ORDER — VALACYCLOVIR HCL 1 G PO TABS: 1000.0000 mg | ORAL_TABLET | Freq: Three times a day (TID) | ORAL | 0 refills | Status: DC

## 2016-07-20 MED ORDER — HYDROCODONE-ACETAMINOPHEN 5-325 MG PO TABS: 1.0000 | ORAL_TABLET | Freq: Four times a day (QID) | ORAL | 0 refills | Status: DC | PRN

## 2016-07-20 NOTE — Patient Instructions (Signed)

## 2016-07-20 NOTE — Progress Notes (Signed)
Pre visit review using our clinic review tool, if applicable. No additional management support is needed unless otherwise documented below in the visit note. 

## 2016-07-20 NOTE — Progress Notes (Signed)
Subjective:     Patient ID: Jose Hood, male   DOB: 14-Jul-1941, 75 y.o.   MRN: [id number]  HPI Patient seen with concern for probable shingles right thoracic region. He had shingles vaccine a few years ago. This past Sunday he noticed a burning sensation around his right armpit area and subsequent rash from the back all around to the front. Blistery rash. Current pain is only about 2 out of 10 but at times around 8 out of 10. He took some Advil with slight relief.  No past medical history on file. No past surgical history on file.  reports that he has never smoked. He has never used smokeless tobacco. He reports that he does not drink alcohol or use drugs. family history is not on file. Allergies  Allergen Reactions  . Metronidazole      Review of Systems  Constitutional: Negative for chills and fever.  Skin: Positive for rash.       Objective:   Physical Exam  Constitutional: He appears well-developed and well-nourished.  Cardiovascular: Normal rate and regular rhythm.   Pulmonary/Chest: Effort normal and breath sounds normal. No respiratory distress. He has no wheezes. He has no rales.  Skin:  Patient has fairly extensive rash right thoracic area just below the axillary region spreading all the way from the midline of the back around to the front which is erythematous base and vesicular surface consistent with shingles rash       Assessment:     Shingles in a right thoracic distribution    Plan:     -Valtrex 1 g 3 times a day for 7 days -Hydrocodone 5 mg 1-2 every 6 hours as needed for severe pain #40 with no refill. -We recommended follow-up with primary in 1-2 weeks if not gradually improving. May need to consider gabapentin for any prolonged pain  Eulas Post MD [facility]

## 2016-08-03 ENCOUNTER — Telehealth: Payer: Self-pay | Admitting: *Deleted

## 2016-08-03 ENCOUNTER — Ambulatory Visit (AMBULATORY_SURGERY_CENTER): Payer: Self-pay | Admitting: *Deleted

## 2016-08-03 VITALS — Ht 73.0 in | Wt 199.8 lb

## 2016-08-03 DIAGNOSIS — Z1211 Encounter for screening for malignant neoplasm of colon: Secondary | ICD-10-CM

## 2016-08-03 NOTE — Telephone Encounter (Signed)
Dr Carlean Purl: pt is scheduled for recall colonoscopy Wednesday 08/17/2016.  Pt has had shingles on his chest and back since 07/20/16.  He saw Dr Elease Hashimoto and was treated with Valtrex.  He recommended that pt follow up with Dr Burnice Logan who is his PCP. I saw pt in [place] today; he says lesions are drying up and he has had no new outbreaks.  Still having a lot of pain.  He told me that he will call Dr Raliegh Ip today and make follow up appt.  Is pt ok ay for colonoscopy as scheduled for 11/1?  Thanks, Juliann Pulse in [facility]

## 2016-08-03 NOTE — Telephone Encounter (Signed)
Pt notified to proceed with colonoscopy as scheduled

## 2016-08-03 NOTE — Telephone Encounter (Signed)
I think he should be - as long as lesions are drying up.

## 2016-08-03 NOTE — Progress Notes (Signed)
No allergies to eggs or soy. No prior  anesthesia.  Pt not given Emmi instructions for colonoscopy  No oxygen use  No diet drug use

## 2016-08-04 ENCOUNTER — Encounter: Payer: Self-pay | Admitting: Internal Medicine

## 2016-08-17 ENCOUNTER — Encounter: Payer: Self-pay | Admitting: Internal Medicine

## 2016-08-17 ENCOUNTER — Ambulatory Visit (AMBULATORY_SURGERY_CENTER): Payer: Medicare Other | Admitting: Internal Medicine

## 2016-08-17 VITALS — BP 116/79 | HR 53 | Temp 97.5°F | Resp 23 | Ht 73.0 in | Wt 199.0 lb

## 2016-08-17 DIAGNOSIS — Z86010 Personal history of colonic polyps: Secondary | ICD-10-CM

## 2016-08-17 MED ORDER — SODIUM CHLORIDE 0.9 % IV SOLN: 500.0000 mL | INTRAVENOUS | Status: DC

## 2016-08-17 NOTE — Progress Notes (Signed)
To PACU  Pt awake and alert.  Report  To RN

## 2016-08-17 NOTE — Patient Instructions (Addendum)
   No polyps today.  No more routine colonoscopy.  I will be here if you need me.  I appreciate the opportunity to care for you. Gatha Mayer, MD, FACG  YOU HAD AN ENDOSCOPIC PROCEDURE TODAY AT [facility]:   Refer to the procedure report that was given to you for any specific questions about what was found during the examination.  If the procedure report does not answer your questions, please call your gastroenterologist to clarify.  If you requested that your care partner not be given the details of your procedure findings, then the procedure report has been included in a sealed envelope for you to review at your convenience later.  YOU SHOULD EXPECT: Some feelings of bloating in the abdomen. Passage of more gas than usual.  Walking can help get rid of the air that was put into your GI tract during the procedure and reduce the bloating. If you had a lower endoscopy (such as a colonoscopy or flexible sigmoidoscopy) you may notice spotting of blood in your stool or on the toilet paper. If you underwent a bowel prep for your procedure, you may not have a normal bowel movement for a few days.  Please Note:  You might notice some irritation and congestion in your nose or some drainage.  This is from the oxygen used during your procedure.  There is no need for concern and it should clear up in a day or so.  SYMPTOMS TO REPORT IMMEDIATELY:   Following lower endoscopy (colonoscopy or flexible sigmoidoscopy):  Excessive amounts of blood in the stool  Significant tenderness or worsening of abdominal pains  Swelling of the abdomen that is new, acute  Fever of 100F or higher  For urgent or emergent issues, a gastroenterologist can be reached at any hour by calling [phone].   DIET:  We do recommend a small meal at first, but then you may proceed to your regular diet.  Drink plenty of fluids but you should avoid alcoholic beverages for 24 hours.  ACTIVITY:  You  should plan to take it easy for the rest of today and you should NOT DRIVE or use heavy machinery until tomorrow (because of the sedation medicines used during the test).    FOLLOW UP: Our staff will call the number listed on your records the next business day following your procedure to check on you and address any questions or concerns that you may have regarding the information given to you following your procedure. If we do not reach you, we will leave a message.  However, if you are feeling well and you are not experiencing any problems, there is no need to return our call.  We will assume that you have returned to your regular daily activities without incident.  SIGNATURES/CONFIDENTIALITY: You and/or your care partner have signed paperwork which will be entered into your electronic medical record.  These signatures attest to the fact that that the information above on your After Visit Summary has been reviewed and is understood.  Full responsibility of the confidentiality of this discharge information lies with you and/or your care-partner.  Please read over handout about diverticulosis and hemorrhoids  Please continue your normal medications

## 2016-08-17 NOTE — Op Note (Signed)
[place] Patient Name: Jose Hood Procedure Date: 08/17/2016 1:28 PM MRN: [id number] Endoscopist: Gatha Mayer , MD Age: 75 Referring MD:  Date of Birth: September 28, 1941 Gender: Male Account #: [id number] Procedure:                Colonoscopy Indications:              High risk colon cancer surveillance: Personal                            history of colonic polyps Medicines:                Propofol per Anesthesia, Monitored Anesthesia Care Procedure:                Pre-Anesthesia Assessment:                           - Prior to the procedure, a History and Physical                            was performed, and patient medications and                            allergies were reviewed. The patient's tolerance of                            previous anesthesia was also reviewed. The risks                            and benefits of the procedure and the sedation                            options and risks were discussed with the patient.                            All questions were answered, and informed consent                            was obtained. Prior Anticoagulants: The patient has                            taken no previous anticoagulant or antiplatelet                            agents. ASA Grade Assessment: II - A patient with                            mild systemic disease. After reviewing the risks                            and benefits, the patient was deemed in                            satisfactory condition to undergo the procedure.  After obtaining informed consent, the colonoscope                            was passed under direct vision. Throughout the                            procedure, the patient's blood pressure, pulse, and                            oxygen saturations were monitored continuously. The                            Model CF-HQ190L [id number]) scope was introduced                            through the anus  and advanced to the the cecum,                            identified by appendiceal orifice and ileocecal                            valve. The colonoscopy was performed without                            difficulty. The patient tolerated the procedure                            well. The quality of the bowel preparation was                            good. The bowel preparation used was Miralax. The                            ileocecal valve, appendiceal orifice, and rectum                            were photographed. Scope In: 1:37:02 PM Scope Out: 1:47:08 PM Scope Withdrawal Time: 0 hours 7 minutes 59 seconds  Total Procedure Duration: 0 hours 10 minutes 6 seconds  Findings:                 The perianal examination was normal.                           The digital rectal exam findings include enlarged                            prostate. Pertinent negatives include no palpable                            rectal lesions.                           Multiple diverticula were found in the sigmoid  colon.                           External and internal hemorrhoids were found during                            retroflexion.                           The exam was otherwise without abnormality on                            direct and retroflexion views. Complications:            No immediate complications. Estimated Blood Loss:     Estimated blood loss: none. Impression:               - Enlarged prostate found on digital rectal exam.                           - Diverticulosis in the sigmoid colon.                           - External and internal hemorrhoids.                           - The examination was otherwise normal on direct                            and retroflexion views.                           - No specimens collected. Recommendation:           - Patient has a contact number available for                            emergencies. The signs and symptoms  of potential                            delayed complications were discussed with the                            patient. Return to normal activities tomorrow.                            Written discharge instructions were provided to the                            patient.                           - Resume previous diet.                           - Continue present medications.                           -  No repeat colonoscopy due to age. Gatha Mayer, MD 08/17/2016 1:59:27 PM This report has been signed electronically.

## 2016-08-18 ENCOUNTER — Telehealth: Payer: Self-pay

## 2016-08-18 NOTE — Telephone Encounter (Signed)
  Follow up Call-  Call back number 08/17/2016  Post procedure Call Back phone  # [phone]  Permission to leave phone message Yes  Some recent data might be hidden     Patient questions:  Do you have a fever, pain , or abdominal swelling? No. Pain Score  0 *  Have you tolerated food without any problems? Yes.    Have you been able to return to your normal activities? Yes.    Do you have any questions about your discharge instructions: Diet   No. Medications  No. Follow up visit  No.  Do you have questions or concerns about your Care? No.  Actions: * If pain score is 4 or above: No action needed, pain <4.  No problems per the pt. maw

## 2016-09-15 ENCOUNTER — Other Ambulatory Visit: Payer: Self-pay | Admitting: Internal Medicine

## 2016-12-08 DIAGNOSIS — L821 Other seborrheic keratosis: Secondary | ICD-10-CM | POA: Diagnosis not present

## 2016-12-08 DIAGNOSIS — L570 Actinic keratosis: Secondary | ICD-10-CM | POA: Diagnosis not present

## 2016-12-08 DIAGNOSIS — D1801 Hemangioma of skin and subcutaneous tissue: Secondary | ICD-10-CM | POA: Diagnosis not present

## 2016-12-08 DIAGNOSIS — Z85828 Personal history of other malignant neoplasm of skin: Secondary | ICD-10-CM | POA: Diagnosis not present

## 2016-12-08 DIAGNOSIS — D2272 Melanocytic nevi of left lower limb, including hip: Secondary | ICD-10-CM | POA: Diagnosis not present

## 2016-12-14 ENCOUNTER — Other Ambulatory Visit: Payer: Self-pay | Admitting: Internal Medicine

## 2017-01-11 DIAGNOSIS — H10413 Chronic giant papillary conjunctivitis, bilateral: Secondary | ICD-10-CM | POA: Diagnosis not present

## 2017-01-11 DIAGNOSIS — H25813 Combined forms of age-related cataract, bilateral: Secondary | ICD-10-CM | POA: Diagnosis not present

## 2017-03-16 ENCOUNTER — Other Ambulatory Visit: Payer: Self-pay | Admitting: Internal Medicine

## 2017-03-23 ENCOUNTER — Other Ambulatory Visit: Payer: Self-pay | Admitting: Internal Medicine

## 2017-06-02 DIAGNOSIS — H7292 Unspecified perforation of tympanic membrane, left ear: Secondary | ICD-10-CM | POA: Diagnosis not present

## 2017-06-02 DIAGNOSIS — H6502 Acute serous otitis media, left ear: Secondary | ICD-10-CM | POA: Diagnosis not present

## 2017-06-02 DIAGNOSIS — H6062 Unspecified chronic otitis externa, left ear: Secondary | ICD-10-CM | POA: Diagnosis not present

## 2017-06-02 DIAGNOSIS — H902 Conductive hearing loss, unspecified: Secondary | ICD-10-CM | POA: Diagnosis not present

## 2017-06-12 DIAGNOSIS — Z85828 Personal history of other malignant neoplasm of skin: Secondary | ICD-10-CM | POA: Diagnosis not present

## 2017-06-12 DIAGNOSIS — L821 Other seborrheic keratosis: Secondary | ICD-10-CM | POA: Diagnosis not present

## 2017-06-12 DIAGNOSIS — C44622 Squamous cell carcinoma of skin of right upper limb, including shoulder: Secondary | ICD-10-CM | POA: Diagnosis not present

## 2017-06-12 DIAGNOSIS — L812 Freckles: Secondary | ICD-10-CM | POA: Diagnosis not present

## 2017-06-12 DIAGNOSIS — D485 Neoplasm of uncertain behavior of skin: Secondary | ICD-10-CM | POA: Diagnosis not present

## 2017-06-12 DIAGNOSIS — L570 Actinic keratosis: Secondary | ICD-10-CM | POA: Diagnosis not present

## 2017-06-12 DIAGNOSIS — C44229 Squamous cell carcinoma of skin of left ear and external auricular canal: Secondary | ICD-10-CM | POA: Diagnosis not present

## 2017-06-16 ENCOUNTER — Encounter: Payer: Self-pay | Admitting: Internal Medicine

## 2017-06-16 ENCOUNTER — Ambulatory Visit (INDEPENDENT_AMBULATORY_CARE_PROVIDER_SITE_OTHER): Payer: Medicare Other | Admitting: Internal Medicine

## 2017-06-16 VITALS — BP 138/78 | HR 57 | Temp 98.3°F | Ht 73.0 in | Wt 196.4 lb

## 2017-06-16 DIAGNOSIS — Z0000 Encounter for general adult medical examination without abnormal findings: Secondary | ICD-10-CM | POA: Diagnosis not present

## 2017-06-16 DIAGNOSIS — M1000 Idiopathic gout, unspecified site: Secondary | ICD-10-CM | POA: Diagnosis not present

## 2017-06-16 DIAGNOSIS — I10 Essential (primary) hypertension: Secondary | ICD-10-CM | POA: Diagnosis not present

## 2017-06-16 DIAGNOSIS — E7800 Pure hypercholesterolemia, unspecified: Secondary | ICD-10-CM | POA: Diagnosis not present

## 2017-06-16 LAB — CBC WITH DIFFERENTIAL/PLATELET
BASOS ABS: 0 10*3/uL (ref 0.0–0.1)
Basophils Relative: 0.7 % (ref 0.0–3.0)
EOS ABS: 0.1 10*3/uL (ref 0.0–0.7)
Eosinophils Relative: 2.7 % (ref 0.0–5.0)
HCT: 51.2 % (ref 39.0–52.0)
HEMOGLOBIN: 17.1 g/dL — AB (ref 13.0–17.0)
LYMPHS ABS: 1.4 10*3/uL (ref 0.7–4.0)
Lymphocytes Relative: 32.8 % (ref 12.0–46.0)
MCHC: 33.4 g/dL (ref 30.0–36.0)
MCV: 89.2 fl (ref 78.0–100.0)
MONO ABS: 0.5 10*3/uL (ref 0.1–1.0)
Monocytes Relative: 11.8 % (ref 3.0–12.0)
NEUTROS PCT: 52 % (ref 43.0–77.0)
Neutro Abs: 2.2 10*3/uL (ref 1.4–7.7)
Platelets: 178 10*3/uL (ref 150.0–400.0)
RBC: 5.74 Mil/uL (ref 4.22–5.81)
RDW: 13.9 % (ref 11.5–15.5)
WBC: 4.1 10*3/uL (ref 4.0–10.5)

## 2017-06-16 LAB — COMPREHENSIVE METABOLIC PANEL
ALBUMIN: 4.1 g/dL (ref 3.5–5.2)
ALK PHOS: 52 U/L (ref 39–117)
ALT: 11 U/L (ref 0–53)
AST: 16 U/L (ref 0–37)
BILIRUBIN TOTAL: 0.8 mg/dL (ref 0.2–1.2)
BUN: 21 mg/dL (ref 6–23)
CO2: 31 mEq/L (ref 19–32)
CREATININE: 1.15 mg/dL (ref 0.40–1.50)
Calcium: 9.3 mg/dL (ref 8.4–10.5)
Chloride: 101 mEq/L (ref 96–112)
GFR: 65.51 mL/min (ref >60.00)
GLUCOSE: 88 mg/dL (ref 70–99)
POTASSIUM: 4.2 meq/L (ref 3.5–5.1)
SODIUM: 139 meq/L (ref 135–145)
TOTAL PROTEIN: 6.5 g/dL (ref 6.0–8.3)

## 2017-06-16 LAB — LDL CHOLESTEROL, DIRECT: LDL DIRECT: 56 mg/dL

## 2017-06-16 LAB — LIPID PANEL
Cholesterol: 163 mg/dL (ref 0–200)
HDL: 27.5 mg/dL — ABNORMAL LOW (ref >39.00)
NonHDL: 135.92
Total CHOL/HDL Ratio: 6
Triglycerides: 395 mg/dL — ABNORMAL HIGH (ref 0.0–149.0)
VLDL: 79 mg/dL — ABNORMAL HIGH (ref 0.0–40.0)

## 2017-06-16 LAB — TSH: TSH: 0.9 u[IU]/mL (ref 0.35–4.50)

## 2017-06-16 NOTE — Patient Instructions (Signed)
Limit your sodium (Salt) intake  Please check your blood pressure on a regular basis.  If it is consistently greater than 150/90, please make an office appointment.    It is important that you exercise regularly, at least 20 minutes 3 to 4 times per week.  If you develop chest pain or shortness of breath seek  medical attention.  Return in one year for follow-up  

## 2017-06-16 NOTE — Progress Notes (Signed)
Subjective:    Patient ID: Jose Hood, male    DOB: October 24, 1940, 75 y.o.   MRN: [id number]  HPI  76 year old patient who is seen today for a preventive health examination and subsequent Medicare wellness visit  He continues to do quite well.  He has a history of essential hypertension.  His lipidemia and gout.  Since his last annual exam.  He had shingles. He is followed annually by urology for BPH. No history recurrent gout. He did have follow-up colonoscopy performed in November 2017.  Past Medical History:  Diagnosis Date  . Gout   . Hypertension   . Shingles 07/20/2016     Social History   Social History  . Marital status: Married    Spouse name: N/A  . Number of children: N/A  . Years of education: N/A   Occupational History  . Not on file.   Social History Main Topics  . Smoking status: Never Smoker  . Smokeless tobacco: Never Used  . Alcohol use No  . Drug use: No  . Sexual activity: Not on file   Other Topics Concern  . Not on file   Social History Narrative  . No narrative on file    Past Surgical History:  Procedure Laterality Date  . NO PAST SURGERIES      Family History  Problem Relation Age of Onset  . Colon cancer Neg Hx     Allergies  Allergen Reactions  . Metronidazole Other (See Comments)    Pt denies allergy    Current Outpatient Prescriptions on File Prior to Visit  Medication Sig Dispense Refill  . finasteride (PROSCAR) 5 MG tablet Take 1 tablet (5 mg total) by mouth daily. 90 tablet 3  . lisinopril (PRINIVIL,ZESTRIL) 40 MG tablet TAKE 1 TABLET BY MOUTH EVERY DAY 90 tablet 1  . probenecid (BENEMID) 500 MG tablet TAKE 1 TABLET BY MOUTH EVERY DAY 90 tablet 1  . propranolol ER (INDERAL LA) 160 MG SR capsule TAKE ONE CAPSULE BY MOUTH EVERY DAY 90 capsule 1  . tamsulosin (FLOMAX) 0.4 MG CAPS capsule Take 1 capsule (0.4 mg total) by mouth daily. 90 capsule 3   Current Facility-Administered Medications on File Prior to Visit    Medication Dose Route Frequency Provider Last Rate Last Dose  . 0.9 %  sodium chloride infusion  500 mL Intravenous Continuous Gatha Mayer, MD        BP 138/78 (BP Location: Left Arm, Patient Position: Sitting, Cuff Size: Normal)   Pulse (!) 57   Temp 98.3 F (36.8 C) (Oral)   Ht 6\' 1"  (1.854 m)   Wt 196 lb 6.4 oz (89.1 kg)   SpO2 97%   BMI 25.91 kg/m   Subsequent  Medicare wellness visit  1. Risk factors, based on past  M,S,F history.  Cardiovascular risk factors include a history of hypertension and mild dyslipidemia.  2.  Physical activities:remains quite active with no exercise limitations.  Does walk frequently throughout the week  3.  Depression/mood:no history of major depression or mood disorder  4.  Hearing:decreased hearing from the left ear following a perforation.  5.  ADL's:independent  6.  Fall risk:low  7.  Home safety:no problems identified  8.  Height weight, and visual acuity;height height and weight stable no change in visual acuity  9.  Counseling:continue heart healthy diet.  More regular and rigorous exercise encouraged  10. Lab orders based on risk factors:laboratory update will be reviewed, including lipid profile  11. Referral : follow-up urology and ophthalmology  12. Care plan:continue efforts at aggressive risk factor modification  13. Cognitive assessment:   14. Screening: Patient provided with a written and personalized 5-10 year screening schedule in the AVS.    15. Provider List Update: primary care, GI, ENT urology and ophthalmology as well as dermatology    Review of Systems  Constitutional: Negative for appetite change, chills, fatigue and fever.  HENT: Positive for hearing loss. Negative for congestion, dental problem, ear pain, sore throat, tinnitus, trouble swallowing and voice change.   Eyes: Negative for pain, discharge and visual disturbance.  Respiratory: Negative for cough, chest tightness, wheezing and stridor.    Cardiovascular: Negative for chest pain, palpitations and leg swelling.  Gastrointestinal: Negative for abdominal distention, abdominal pain, blood in stool, constipation, diarrhea, nausea and vomiting.  Genitourinary: Negative for difficulty urinating, discharge, flank pain, genital sores, hematuria and urgency.  Musculoskeletal: Negative for arthralgias, back pain, gait problem, joint swelling, myalgias and neck stiffness.  Skin: Negative for rash.  Neurological: Negative for dizziness, syncope, speech difficulty, weakness, numbness and headaches.  Hematological: Negative for adenopathy. Does not bruise/bleed easily.  Psychiatric/Behavioral: Negative for behavioral problems and dysphoric mood. The patient is not nervous/anxious.        Objective:   Physical Exam  Constitutional: He appears well-developed and well-nourished.  HENT:  Head: Normocephalic and atraumatic.  Right Ear: External ear normal.  Left Ear: External ear normal.  Nose: Nose normal.  Mouth/Throat: Oropharynx is clear and moist.  Eyes: Pupils are equal, round, and reactive to light. Conjunctivae and EOM are normal. No scleral icterus.  Neck: Normal range of motion. Neck supple. No JVD present. No thyromegaly present.  Cardiovascular: Regular rhythm, normal heart sounds and intact distal pulses.  Exam reveals no gallop and no friction rub.   No murmur heard. Pulmonary/Chest: Effort normal and breath sounds normal. He exhibits no tenderness.  Abdominal: Soft. Bowel sounds are normal. He exhibits no distension and no mass. There is no tenderness.  Genitourinary: Penis normal.  Genitourinary Comments: uncircumcised  Musculoskeletal: Normal range of motion. He exhibits no edema or tenderness.  Lymphadenopathy:    He has no cervical adenopathy.  Neurological: He is alert. He has normal reflexes. No cranial nerve deficit. Coordination normal.  Skin: Skin is warm and dry. No rash noted.  Senile changes only   Psychiatric: He has a normal mood and affect. His behavior is normal.          Assessment & Plan:   Preventive health care Subsequent Medicare illness visit Essential hypertension, stable Mild dyslipidemia.  We'll review a lipid profile Gout.  Stable BPH.  Follow-up urology History of osteoarthritis, stable Decreased hearing left ear following perforation.  Follow-up ENT  Return here in one year or as needed  Nyoka Cowden

## 2017-06-17 ENCOUNTER — Other Ambulatory Visit: Payer: Self-pay | Admitting: Internal Medicine

## 2017-06-18 ENCOUNTER — Other Ambulatory Visit: Payer: Self-pay | Admitting: Internal Medicine

## 2017-07-05 DIAGNOSIS — H6122 Impacted cerumen, left ear: Secondary | ICD-10-CM | POA: Diagnosis not present

## 2017-07-05 DIAGNOSIS — H7292 Unspecified perforation of tympanic membrane, left ear: Secondary | ICD-10-CM | POA: Diagnosis not present

## 2017-07-05 DIAGNOSIS — H6692 Otitis media, unspecified, left ear: Secondary | ICD-10-CM | POA: Diagnosis not present

## 2017-07-12 DIAGNOSIS — H7292 Unspecified perforation of tympanic membrane, left ear: Secondary | ICD-10-CM | POA: Diagnosis not present

## 2017-07-12 DIAGNOSIS — H6062 Unspecified chronic otitis externa, left ear: Secondary | ICD-10-CM | POA: Diagnosis not present

## 2017-07-12 DIAGNOSIS — H902 Conductive hearing loss, unspecified: Secondary | ICD-10-CM | POA: Diagnosis not present

## 2017-07-12 DIAGNOSIS — H6692 Otitis media, unspecified, left ear: Secondary | ICD-10-CM | POA: Diagnosis not present

## 2017-07-19 DIAGNOSIS — H7292 Unspecified perforation of tympanic membrane, left ear: Secondary | ICD-10-CM | POA: Diagnosis not present

## 2017-07-19 DIAGNOSIS — H902 Conductive hearing loss, unspecified: Secondary | ICD-10-CM | POA: Diagnosis not present

## 2017-07-19 DIAGNOSIS — H6062 Unspecified chronic otitis externa, left ear: Secondary | ICD-10-CM | POA: Diagnosis not present

## 2017-09-02 DIAGNOSIS — J0100 Acute maxillary sinusitis, unspecified: Secondary | ICD-10-CM | POA: Diagnosis not present

## 2017-09-02 DIAGNOSIS — J209 Acute bronchitis, unspecified: Secondary | ICD-10-CM | POA: Diagnosis not present

## 2017-09-14 ENCOUNTER — Other Ambulatory Visit: Payer: Self-pay | Admitting: Internal Medicine

## 2017-12-07 DIAGNOSIS — D0439 Carcinoma in situ of skin of other parts of face: Secondary | ICD-10-CM | POA: Diagnosis not present

## 2017-12-07 DIAGNOSIS — L821 Other seborrheic keratosis: Secondary | ICD-10-CM | POA: Diagnosis not present

## 2017-12-07 DIAGNOSIS — D225 Melanocytic nevi of trunk: Secondary | ICD-10-CM | POA: Diagnosis not present

## 2017-12-07 DIAGNOSIS — L570 Actinic keratosis: Secondary | ICD-10-CM | POA: Diagnosis not present

## 2017-12-07 DIAGNOSIS — D1801 Hemangioma of skin and subcutaneous tissue: Secondary | ICD-10-CM | POA: Diagnosis not present

## 2017-12-07 DIAGNOSIS — Z85828 Personal history of other malignant neoplasm of skin: Secondary | ICD-10-CM | POA: Diagnosis not present

## 2017-12-08 DIAGNOSIS — R3912 Poor urinary stream: Secondary | ICD-10-CM | POA: Diagnosis not present

## 2017-12-08 DIAGNOSIS — N401 Enlarged prostate with lower urinary tract symptoms: Secondary | ICD-10-CM | POA: Diagnosis not present

## 2017-12-24 ENCOUNTER — Other Ambulatory Visit: Payer: Self-pay | Admitting: Internal Medicine

## 2018-01-02 ENCOUNTER — Other Ambulatory Visit: Payer: Self-pay | Admitting: Internal Medicine

## 2018-01-11 DIAGNOSIS — H10413 Chronic giant papillary conjunctivitis, bilateral: Secondary | ICD-10-CM | POA: Diagnosis not present

## 2018-01-11 DIAGNOSIS — H40013 Open angle with borderline findings, low risk, bilateral: Secondary | ICD-10-CM | POA: Diagnosis not present

## 2018-01-11 DIAGNOSIS — H25813 Combined forms of age-related cataract, bilateral: Secondary | ICD-10-CM | POA: Diagnosis not present

## 2018-01-11 DIAGNOSIS — H11153 Pinguecula, bilateral: Secondary | ICD-10-CM | POA: Diagnosis not present

## 2018-03-30 ENCOUNTER — Other Ambulatory Visit: Payer: Self-pay | Admitting: Internal Medicine

## 2018-04-26 DIAGNOSIS — H11153 Pinguecula, bilateral: Secondary | ICD-10-CM | POA: Diagnosis not present

## 2018-04-26 DIAGNOSIS — H25812 Combined forms of age-related cataract, left eye: Secondary | ICD-10-CM | POA: Diagnosis not present

## 2018-04-26 DIAGNOSIS — H40013 Open angle with borderline findings, low risk, bilateral: Secondary | ICD-10-CM | POA: Diagnosis not present

## 2018-04-26 DIAGNOSIS — H25811 Combined forms of age-related cataract, right eye: Secondary | ICD-10-CM | POA: Diagnosis not present

## 2018-05-16 DIAGNOSIS — H25812 Combined forms of age-related cataract, left eye: Secondary | ICD-10-CM | POA: Diagnosis not present

## 2018-05-16 DIAGNOSIS — H52222 Regular astigmatism, left eye: Secondary | ICD-10-CM | POA: Diagnosis not present

## 2018-05-16 DIAGNOSIS — H2512 Age-related nuclear cataract, left eye: Secondary | ICD-10-CM | POA: Diagnosis not present

## 2018-06-07 ENCOUNTER — Encounter: Payer: Self-pay | Admitting: Internal Medicine

## 2018-06-07 DIAGNOSIS — D2272 Melanocytic nevi of left lower limb, including hip: Secondary | ICD-10-CM | POA: Diagnosis not present

## 2018-06-07 DIAGNOSIS — C44319 Basal cell carcinoma of skin of other parts of face: Secondary | ICD-10-CM | POA: Diagnosis not present

## 2018-06-07 DIAGNOSIS — D2271 Melanocytic nevi of right lower limb, including hip: Secondary | ICD-10-CM | POA: Diagnosis not present

## 2018-06-07 DIAGNOSIS — Z85828 Personal history of other malignant neoplasm of skin: Secondary | ICD-10-CM | POA: Diagnosis not present

## 2018-06-07 DIAGNOSIS — L821 Other seborrheic keratosis: Secondary | ICD-10-CM | POA: Diagnosis not present

## 2018-06-07 DIAGNOSIS — D1801 Hemangioma of skin and subcutaneous tissue: Secondary | ICD-10-CM | POA: Diagnosis not present

## 2018-06-07 DIAGNOSIS — L570 Actinic keratosis: Secondary | ICD-10-CM | POA: Diagnosis not present

## 2018-06-14 DIAGNOSIS — H2511 Age-related nuclear cataract, right eye: Secondary | ICD-10-CM | POA: Diagnosis not present

## 2018-06-19 ENCOUNTER — Encounter: Payer: Self-pay | Admitting: Internal Medicine

## 2018-06-19 ENCOUNTER — Ambulatory Visit (INDEPENDENT_AMBULATORY_CARE_PROVIDER_SITE_OTHER): Payer: Medicare Other | Admitting: Internal Medicine

## 2018-06-19 VITALS — BP 140/68 | HR 56 | Temp 98.3°F | Ht 72.5 in | Wt 200.0 lb

## 2018-06-19 DIAGNOSIS — I10 Essential (primary) hypertension: Secondary | ICD-10-CM | POA: Diagnosis not present

## 2018-06-19 DIAGNOSIS — E7800 Pure hypercholesterolemia, unspecified: Secondary | ICD-10-CM

## 2018-06-19 DIAGNOSIS — M1000 Idiopathic gout, unspecified site: Secondary | ICD-10-CM | POA: Diagnosis not present

## 2018-06-19 DIAGNOSIS — Z0000 Encounter for general adult medical examination without abnormal findings: Secondary | ICD-10-CM

## 2018-06-19 DIAGNOSIS — M150 Primary generalized (osteo)arthritis: Secondary | ICD-10-CM | POA: Diagnosis not present

## 2018-06-19 DIAGNOSIS — M159 Polyosteoarthritis, unspecified: Secondary | ICD-10-CM

## 2018-06-19 LAB — CBC WITH DIFFERENTIAL/PLATELET
BASOS PCT: 0.6 % (ref 0.0–3.0)
Basophils Absolute: 0 10*3/uL (ref 0.0–0.1)
EOS ABS: 0.1 10*3/uL (ref 0.0–0.7)
Eosinophils Relative: 2.8 % (ref 0.0–5.0)
HEMATOCRIT: 49.9 % (ref 39.0–52.0)
HEMOGLOBIN: 16.9 g/dL (ref 13.0–17.0)
LYMPHS PCT: 32.6 % (ref 12.0–46.0)
Lymphs Abs: 1.5 10*3/uL (ref 0.7–4.0)
MCHC: 33.9 g/dL (ref 30.0–36.0)
MCV: 88.6 fl (ref 78.0–100.0)
MONO ABS: 0.6 10*3/uL (ref 0.1–1.0)
Monocytes Relative: 13.9 % — ABNORMAL HIGH (ref 3.0–12.0)
Neutro Abs: 2.3 10*3/uL (ref 1.4–7.7)
Neutrophils Relative %: 50.1 % (ref 43.0–77.0)
Platelets: 172 10*3/uL (ref 150.0–400.0)
RBC: 5.64 Mil/uL (ref 4.22–5.81)
RDW: 13.9 % (ref 11.5–15.5)
WBC: 4.6 10*3/uL (ref 4.0–10.5)

## 2018-06-19 LAB — COMPREHENSIVE METABOLIC PANEL
ALBUMIN: 4 g/dL (ref 3.5–5.2)
ALK PHOS: 47 U/L (ref 39–117)
ALT: 13 U/L (ref 0–53)
AST: 18 U/L (ref 0–37)
BUN: 23 mg/dL (ref 6–23)
CALCIUM: 9.4 mg/dL (ref 8.4–10.5)
CHLORIDE: 103 meq/L (ref 96–112)
CO2: 30 mEq/L (ref 19–32)
CREATININE: 1.3 mg/dL (ref 0.40–1.50)
GFR: 56.72 mL/min — ABNORMAL LOW (ref >60.00)
Glucose, Bld: 98 mg/dL (ref 70–99)
POTASSIUM: 4.5 meq/L (ref 3.5–5.1)
Sodium: 141 mEq/L (ref 135–145)
TOTAL PROTEIN: 6.4 g/dL (ref 6.0–8.3)
Total Bilirubin: 0.9 mg/dL (ref 0.2–1.2)

## 2018-06-19 LAB — LIPID PANEL
CHOLESTEROL: 153 mg/dL (ref 0–200)
HDL: 31.2 mg/dL — AB (ref >39.00)
LDL Cholesterol: 83 mg/dL (ref 0–99)
NonHDL: 121.63
TRIGLYCERIDES: 192 mg/dL — AB (ref 0.0–149.0)
Total CHOL/HDL Ratio: 5
VLDL: 38.4 mg/dL (ref 0.0–40.0)

## 2018-06-19 LAB — TSH: TSH: 0.99 u[IU]/mL (ref 0.35–4.50)

## 2018-06-19 MED ORDER — FINASTERIDE 5 MG PO TABS: 5.0000 mg | ORAL_TABLET | Freq: Every day | ORAL | 3 refills | Status: DC

## 2018-06-19 MED ORDER — LISINOPRIL 40 MG PO TABS: 40.0000 mg | ORAL_TABLET | Freq: Every day | ORAL | 4 refills | Status: DC

## 2018-06-19 MED ORDER — TAMSULOSIN HCL 0.4 MG PO CAPS: 0.4000 mg | ORAL_CAPSULE | Freq: Every day | ORAL | 3 refills | Status: DC

## 2018-06-19 MED ORDER — PROBENECID 500 MG PO TABS: 500.0000 mg | ORAL_TABLET | Freq: Every day | ORAL | 4 refills | Status: AC

## 2018-06-19 MED ORDER — PROPRANOLOL HCL ER 160 MG PO CP24: 160.0000 mg | ORAL_CAPSULE | Freq: Every day | ORAL | 4 refills | Status: DC

## 2018-06-19 NOTE — Patient Instructions (Addendum)
Limit your sodium (Salt) intake  Please check your blood pressure on a regular basis.  If it is consistently greater than 150/90, please make an office appointment.    It is important that you exercise regularly, at least 20 minutes 3 to 4 times per week.  If you develop chest pain or shortness of breath seek  medical attention.  Return in one year for follow-up  

## 2018-06-19 NOTE — Progress Notes (Signed)
Subjective:    Patient ID: Jose Hood, male    DOB: 08-03-1941, 77 y.o.   MRN: [id number]  HPI 77 year old patient who is seen today for a annual preventive health examination as well as a subsequent Medicare wellness visit He has a long history of essential hypertension which has been stable.  He monitors daily blood pressure readings with nice results. He is followed by urology annually with a history of BPH.  His has been stable. He has deafness left ear from a prior perforation and is also followed by ENT. His last colonoscopy was 2017. No concerns or complaints.  He has had a recent left cataract extraction surgery which has done well.  He plans on having his left eye surgery tomorrow  Family history fairly noncontributory.  Father died at 81 and MI.  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 Gout   . Hypertension   . Shingles 07/20/2016     Social History   Socioeconomic History  . Marital status: Married    Spouse name: Not on file  . Number of children: Not on file  . Years of education: Not on file  . Highest education level: Not on file  Occupational History  . Not on file  Social Needs  . Financial resource strain: Not on file  . Food insecurity:    Worry: Not on file    Inability: Not on file  . Transportation needs:    Medical: Not on file    Non-medical: Not on file  Tobacco Use  . Smoking status: Never Smoker  . Smokeless tobacco: Never Used  Substance and Sexual Activity  . Alcohol use: No  . Drug use: No  . Sexual activity: Not on file  Lifestyle  . Physical activity:    Days per week: Not on file    Minutes per session: Not on file  . Stress: Not on file  Relationships  . Social connections:    Talks on phone: Not on file    Gets together: Not on file    Attends religious service: Not on file    Active member of club or  organization: Not on file    Attends meetings of clubs or organizations: Not on file    Relationship status: Not on file  . Intimate partner violence:    Fear of current or ex partner: Not on file    Emotionally abused: Not on file    Physically abused: Not on file    Forced sexual activity: Not on file  Other Topics Concern  . Not on file  Social History Narrative  . Not on file    Past Surgical History:  Procedure Laterality Date  . NO PAST SURGERIES      Family History  Problem Relation Age of Onset  . Colon cancer Neg Hx     Allergies  Allergen Reactions  . Metronidazole Other (See Comments)    Pt denies allergy    No current outpatient medications on file prior to visit.   Current Facility-Administered Medications on File Prior to Visit  Medication Dose Route Frequency Provider Last Rate Last Dose  . 0.9 %  sodium chloride infusion  500 mL Intravenous Continuous Gatha Mayer, MD        BP 140/68 (BP Location: Right Arm, Patient Position: Sitting, Cuff Size: Large)  Pulse (!) 56   Temp 98.3 F (36.8 C) (Oral)   Ht 6' 0.5" (1.842 m)   Wt 200 lb (90.7 kg)   SpO2 96%   BMI 26.75 kg/m    Subsequent Medicare wellness visit  1. Risk factors, based on past  M,S,F history.  [place] risk factors include hypertension only  2.  Physical activities: Remains quite active with walking.  Remains fully employed  3.  Depression/mood: No history of depression   4.  Hearing: Chronic deafness left ear  5.  ADL's: Independent  6.  Fall risk: Low  7.  Home safety: No problems identified  8.  Height weight, and visual acuity; height and weight stable patient has had a left cataract extraction surgery and is planned on right eye surgery tomorrow  9.  Counseling: Continue heart healthy diet modest weight loss encouraged more active exercise also recommended  10. Lab orders based on risk factors: Laboratory update will be reviewed  11. Referral : Follow-up ENT and  urology  12. Care plan: Continue efforts at aggressive risk factor modification  13. Cognitive assessment: Alert and appropriate normal affect.  No cognitive dysfunction  14. Screening: Patient provided with a written and personalized 5-10 year screening schedule in the AVS.    15. Provider List Update: Primary care urology and ENT    Review of Systems  Constitutional: Negative for appetite change, chills, fatigue and fever.  HENT: Negative for congestion, dental problem, ear pain, hearing loss, sore throat, tinnitus, trouble swallowing and voice change.   Eyes: Negative for pain, discharge and visual disturbance.  Respiratory: Negative for cough, chest tightness, wheezing and stridor.   Cardiovascular: Negative for chest pain, palpitations and leg swelling.  Gastrointestinal: Negative for abdominal distention, abdominal pain, blood in stool, constipation, diarrhea, nausea and vomiting.  Genitourinary: Negative for difficulty urinating, discharge, flank pain, genital sores, hematuria and urgency.  Musculoskeletal: Negative for arthralgias, back pain, gait problem, joint swelling, myalgias and neck stiffness.  Skin: Negative for rash.  Neurological: Negative for dizziness, syncope, speech difficulty, weakness, numbness and headaches.  Hematological: Negative for adenopathy. Does not bruise/bleed easily.  Psychiatric/Behavioral: Negative for behavioral problems and dysphoric mood. The patient is not nervous/anxious.        Objective:   Physical Exam  Constitutional: He appears well-developed and well-nourished.  HENT:  Head: Normocephalic and atraumatic.  Right Ear: External ear normal.  Left Ear: External ear normal.  Nose: Nose normal.  Mouth/Throat: Oropharynx is clear and moist.  Large old perforation left TM  Eyes: Pupils are equal, round, and reactive to light. Conjunctivae and EOM are normal. No scleral icterus.  Neck: Normal range of motion. Neck supple. No JVD present.  No thyromegaly present.  Cardiovascular: Regular rhythm, normal heart sounds and intact distal pulses. Exam reveals no gallop and no friction rub.  No murmur heard. Pulmonary/Chest: Effort normal and breath sounds normal. He exhibits no tenderness.  Abdominal: Soft. Bowel sounds are normal. He exhibits no distension and no mass. There is no tenderness.  Right inguinal hernia with surgical scar  Genitourinary: Prostate normal and penis normal.  Musculoskeletal: Normal range of motion. He exhibits edema. He exhibits no tenderness.  +1 ankle edema  Lymphadenopathy:    He has no cervical adenopathy.  Neurological: He is alert. He has normal reflexes. No cranial nerve deficit. Coordination normal.  Skin: Skin is warm and dry. No rash noted.  Psychiatric: He has a normal mood and affect. His behavior is normal.  Assessment & Plan:   Preventive health examination Subsequent Medicare wellness visit Essential hypertension well-controlled History of gout stable BPH follow-up urology  Will review updated lab medications updated [place] weight loss and more regular exercise encouraged Follow-up 6 to 12 months  Marletta Lor

## 2018-06-20 DIAGNOSIS — H52221 Regular astigmatism, right eye: Secondary | ICD-10-CM | POA: Diagnosis not present

## 2018-06-20 DIAGNOSIS — H2511 Age-related nuclear cataract, right eye: Secondary | ICD-10-CM | POA: Diagnosis not present

## 2018-06-20 DIAGNOSIS — H25811 Combined forms of age-related cataract, right eye: Secondary | ICD-10-CM | POA: Diagnosis not present

## 2018-06-25 ENCOUNTER — Other Ambulatory Visit: Payer: Self-pay | Admitting: Internal Medicine

## 2018-06-25 ENCOUNTER — Telehealth: Payer: Self-pay

## 2018-06-25 DIAGNOSIS — H9190 Unspecified hearing loss, unspecified ear: Secondary | ICD-10-CM

## 2018-06-25 NOTE — Telephone Encounter (Signed)
Copied from [place] [id number]. Topic: Referral - Request >> Jun 25, 2018  9:57 AM Synthia Innocent wrote: Reason for CRM: Requesting referral for hearing test at [facility].

## 2018-06-26 NOTE — Telephone Encounter (Signed)
Referral placed. Pt notified via detailed VM.

## 2018-06-26 NOTE — Telephone Encounter (Signed)
Okay for referral?

## 2018-07-05 ENCOUNTER — Other Ambulatory Visit: Payer: Self-pay

## 2018-07-17 ENCOUNTER — Encounter: Payer: Self-pay | Admitting: Internal Medicine

## 2018-07-17 DIAGNOSIS — H90A21 Sensorineural hearing loss, unilateral, right ear, with restricted hearing on the contralateral side: Secondary | ICD-10-CM | POA: Diagnosis not present

## 2018-07-17 DIAGNOSIS — H9312 Tinnitus, left ear: Secondary | ICD-10-CM | POA: Diagnosis not present

## 2018-11-07 DIAGNOSIS — Z961 Presence of intraocular lens: Secondary | ICD-10-CM | POA: Diagnosis not present

## 2018-11-07 DIAGNOSIS — H40013 Open angle with borderline findings, low risk, bilateral: Secondary | ICD-10-CM | POA: Diagnosis not present

## 2018-12-07 DIAGNOSIS — L812 Freckles: Secondary | ICD-10-CM | POA: Diagnosis not present

## 2018-12-07 DIAGNOSIS — L821 Other seborrheic keratosis: Secondary | ICD-10-CM | POA: Diagnosis not present

## 2018-12-07 DIAGNOSIS — Z85828 Personal history of other malignant neoplasm of skin: Secondary | ICD-10-CM | POA: Diagnosis not present

## 2018-12-07 DIAGNOSIS — L570 Actinic keratosis: Secondary | ICD-10-CM | POA: Diagnosis not present

## 2018-12-07 DIAGNOSIS — D1801 Hemangioma of skin and subcutaneous tissue: Secondary | ICD-10-CM | POA: Diagnosis not present

## 2019-05-09 DIAGNOSIS — H40013 Open angle with borderline findings, low risk, bilateral: Secondary | ICD-10-CM | POA: Diagnosis not present

## 2019-05-09 DIAGNOSIS — Z961 Presence of intraocular lens: Secondary | ICD-10-CM | POA: Diagnosis not present

## 2019-05-23 ENCOUNTER — Ambulatory Visit: Payer: Medicare Other | Admitting: Internal Medicine

## 2019-06-05 DIAGNOSIS — D1801 Hemangioma of skin and subcutaneous tissue: Secondary | ICD-10-CM | POA: Diagnosis not present

## 2019-06-05 DIAGNOSIS — Z85828 Personal history of other malignant neoplasm of skin: Secondary | ICD-10-CM | POA: Diagnosis not present

## 2019-06-05 DIAGNOSIS — L570 Actinic keratosis: Secondary | ICD-10-CM | POA: Diagnosis not present

## 2019-06-05 DIAGNOSIS — L812 Freckles: Secondary | ICD-10-CM | POA: Diagnosis not present

## 2019-06-12 DIAGNOSIS — J039 Acute tonsillitis, unspecified: Secondary | ICD-10-CM | POA: Diagnosis not present

## 2019-07-08 DIAGNOSIS — I10 Essential (primary) hypertension: Secondary | ICD-10-CM | POA: Diagnosis not present

## 2019-07-08 DIAGNOSIS — Z23 Encounter for immunization: Secondary | ICD-10-CM | POA: Diagnosis not present

## 2019-07-22 DIAGNOSIS — I10 Essential (primary) hypertension: Secondary | ICD-10-CM | POA: Diagnosis not present

## 2019-10-07 ENCOUNTER — Other Ambulatory Visit: Payer: Self-pay

## 2019-10-07 NOTE — Patient Outreach (Signed)
[facility]) Care Management  10/07/2019  Anoop Bissett 06-30-1941 [id number]   Medication Adherence call to [name] [facility] Identifiers Verify spoke with patient he is past due on Lisinopril 40 mg,patient explain he take 1 tablet daily,patient has place an order thru [facility] and is expecting it soon,patient has medication at this time. [name] is showing past due under [place].  [facility] Direct Dial [phone]  Fax [phone] [email or web address]

## 2020-08-03 ENCOUNTER — Other Ambulatory Visit (HOSPITAL_COMMUNITY): Payer: Self-pay | Admitting: Family Medicine

## 2020-08-03 DIAGNOSIS — R011 Cardiac murmur, unspecified: Secondary | ICD-10-CM

## 2020-08-27 ENCOUNTER — Ambulatory Visit (HOSPITAL_COMMUNITY): Payer: Medicare Other | Attending: Internal Medicine

## 2020-08-27 ENCOUNTER — Other Ambulatory Visit: Payer: Self-pay

## 2020-08-27 DIAGNOSIS — R011 Cardiac murmur, unspecified: Secondary | ICD-10-CM | POA: Diagnosis present

## 2020-08-27 LAB — ECHOCARDIOGRAM COMPLETE
AR max vel: 3.01 cm2
AV Area VTI: 2.9 cm2
AV Area mean vel: 2.95 cm2
AV Mean grad: 8 mmHg
AV Peak grad: 9.9 mmHg
Ao pk vel: 1.58 m/s
Area-P 1/2: 3.21 cm2
P 1/2 time: 774 msec
S' Lateral: 2.7 cm

## 2020-09-17 NOTE — Progress Notes (Signed)
Cardiology Office Note:    Date:  09/18/2020   ID:  Jose Hood, DOB Oct 13, 1941, MRN [id number]  PCP:  London Pepper, MD  Cardiologist:  No primary care provider on file.  Electrophysiologist:  None   Referring MD: London Pepper, MD   Chief Complaint  Patient presents with  . Heart Murmur    History of Present Illness:    Jose Hood is a 79 y.o. male with a hx of hypertension who is referred by Dr. Orland Mustard for evaluation of heart murmur.  He reports no chest pain or dyspnea.  Reports he walks 30 minutes/day.  He denies any lightheadedness, syncope, or palpitations.  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  Reports BP has been controlled at home but sometimes up to 140s.  Never smoked.  Father died of MI at age 73.  Echocardiogram on 11/21 shows mild AI, aortic valve sclerosis without stenosis (though gradient may be underestimated due to suboptimal Doppler alignment), moderate dilatation of ascending aorta measuring 43mm, normal biventricular function.   Past Medical History:  Diagnosis Date  . Gout   . Hypertension   . Shingles 07/20/2016    Past Surgical History:  Procedure Laterality Date  . NO PAST SURGERIES      Current Medications: Current Meds  Medication Sig  . amLODipine (NORVASC) 5 MG tablet Take 5 mg by mouth daily.  . finasteride (PROSCAR) 5 MG tablet TAKE 1 TABLET BY MOUTH EVERY DAY  . lisinopril (PRINIVIL,ZESTRIL) 40 MG tablet Take 1 tablet (40 mg total) by mouth daily.  . probenecid (BENEMID) 500 MG tablet Take 1 tablet (500 mg total) by mouth daily.  . propranolol ER (INDERAL LA) 160 MG SR capsule Take 1 capsule (160 mg total) by mouth daily.  . tamsulosin (FLOMAX) 0.4 MG CAPS capsule TAKE ONE CAPSULE BY MOUTH EVERY DAY   Current Facility-Administered Medications for the 09/18/20 encounter (Office Visit) with Donato Heinz, MD  Medication  . 0.9 %  sodium  chloride infusion     Allergies:   Metronidazole   Social History   Socioeconomic History  . Marital status: Married    Spouse name: Not on file  . Number of children: Not on file  . Years of education: Not on file  . Highest education level: Not on file  Occupational History  . Not on file  Tobacco Use  . Smoking status: Never Smoker  . Smokeless tobacco: Never Used  Substance and Sexual Activity  . Alcohol use: No  . Drug use: No  . Sexual activity: Not on file  Other Topics Concern  . Not on file  Social History Narrative  . Not on file   Social Determinants of Health   Financial Resource Strain:   . Difficulty of Paying Living Expenses: Not on file  Food Insecurity:   . Worried About Charity fundraiser in the Last Year: Not on file  . Ran Out of Food in the Last Year: Not on file  Transportation Needs:   . Lack of Transportation (Medical): Not on file  . Lack of Transportation (Non-Medical): Not on file  Physical Activity:   . Days of Exercise per Week: Not on file  . Minutes of Exercise per Session: Not on file  Stress:   . Feeling of Stress : Not on file  Social Connections:   . Frequency of Communication with Friends and Family: Not  on file  . Frequency of Social Gatherings with Friends and Family: Not on file  . Attends Religious Services: Not on file  . Active Member of Clubs or Organizations: Not on file  . Attends Archivist Meetings: Not on file  . Marital Status: Not on file     Family History: The patient's family history is negative for Colon cancer.  ROS:   Please see the history of present illness.     All other systems reviewed and are negative.  EKGs/Labs/Other Studies Reviewed:    The following studies were reviewed today:   EKG:  EKG is ordered today.  The ekg ordered today demonstrates sinus bradycardia, rate 59, no ST/T abnormality  Recent Labs: No results found for requested labs within last 8760 hours.  Recent  Lipid Panel    Component Value Date/Time   CHOL 153 06/19/2018 0830   TRIG 192.0 (H) 06/19/2018 0830   HDL 31.20 (L) 06/19/2018 0830   CHOLHDL 5 06/19/2018 0830   VLDL 38.4 06/19/2018 0830   LDLCALC 83 06/19/2018 0830   LDLDIRECT 56.0 06/16/2017 0916    Physical Exam:    VS:  BP 130/86   Pulse (!) 59   Ht 6\' 1"  (1.854 m)   Wt 201 lb (91.2 kg)   SpO2 98%   BMI 26.52 kg/m     Wt Readings from Last 3 Encounters:  09/18/20 201 lb (91.2 kg)  06/19/18 200 lb (90.7 kg)  06/16/17 196 lb 6.4 oz (89.1 kg)     GEN: Well nourished, well developed in no acute distress HEENT: Normal NECK: No JVD; No carotid bruits LYMPHATICS: No lymphadenopathy CARDIAC: RRR, 2/6 systolic murmur RESPIRATORY:  Clear to auscultation without rales, wheezing or rhonchi  ABDOMEN: Soft, non-tender, non-distended MUSCULOSKELETAL:  No edema; No deformity  SKIN: Warm and dry NEUROLOGIC:  Alert and oriented x 3 PSYCHIATRIC:  Normal affect   ASSESSMENT:    1. Aortic dilatation (HCC)   2. Medication management   3. Aortic valve insufficiency, etiology of cardiac valve disease unspecified   4. Essential hypertension    PLAN:    Ascending aortic dilatation: measured 30mm on echo.  Will evaluate further with CTA chest  Mild AI: on echo 08/2020.  Aortic sclerosis but no stenosis (may be underestimated due to suboptimal Doppler alignment).  Will monitor   Hypertension: On amlodipine 5 mg daily, lisinopril 40 mg daily, propranolol 160 mg.  Appears controlled, but does report occasional high BP at home.  Asked patient to monitor BP twice daily for next week and call with results  RTC in 3 months   Medication Adjustments/Labs and Tests Ordered: Current medicines are reviewed at length with the patient today.  Concerns regarding medicines are outlined above.  Orders Placed This Encounter  Procedures  . CT ANGIO CHEST AORTA W/CM & OR WO/CM  . Basic Metabolic Panel (BMET)  . EKG 12-Lead   No orders of the  defined types were placed in this encounter.   Patient Instructions  Medication Instructions:  Continue current medications  *If you need a refill on your cardiac medications before your next appointment, please call your pharmacy*   Lab Work: [facility]   Testing/Procedures: Your physician has requested that you have a CT Angio. This test is done at [place] at [place]: At [facility], you and your health needs are our priority.  As part of our continuing mission to provide you with exceptional heart care,  we have created designated Provider Care Teams.  These Care Teams include your primary Cardiologist (physician) and Advanced Practice Providers (APPs -  Physician Assistants and Nurse Practitioners) who all work together to provide you with the care you need, when you need it.  We recommend signing up for the patient portal called "MyChart".  Sign up information is provided on this After Visit Summary.  MyChart is used to connect with patients for Virtual Visits (Telemedicine).  Patients are able to view lab/test results, encounter notes, upcoming appointments, etc.  Non-urgent messages can be sent to your provider as well.   To learn more about what you can do with MyChart, go to [email or web address].    Your next appointment:   3 month(s)  The format for your next appointment:   In Person  Provider:   You may see Oswaldo Milian, MD or one of the following Advanced Practice Providers on your designated Care Team:    Rosaria Ferries, PA-C  Jory Sims, DNP, ANP    Other Instructions Keep blood pressure check 2 time a day for 1 week     Signed, Donato Heinz, MD  09/18/2020 6:42 PM    [place]

## 2020-09-18 ENCOUNTER — Other Ambulatory Visit: Payer: Self-pay

## 2020-09-18 ENCOUNTER — Encounter: Payer: Self-pay | Admitting: Cardiology

## 2020-09-18 ENCOUNTER — Ambulatory Visit: Payer: Medicare Other | Admitting: Cardiology

## 2020-09-18 VITALS — BP 130/86 | HR 59 | Ht 73.0 in | Wt 201.0 lb

## 2020-09-18 DIAGNOSIS — I10 Essential (primary) hypertension: Secondary | ICD-10-CM

## 2020-09-18 DIAGNOSIS — I77819 Aortic ectasia, unspecified site: Secondary | ICD-10-CM | POA: Diagnosis not present

## 2020-09-18 DIAGNOSIS — Z79899 Other long term (current) drug therapy: Secondary | ICD-10-CM | POA: Diagnosis not present

## 2020-09-18 DIAGNOSIS — I351 Nonrheumatic aortic (valve) insufficiency: Secondary | ICD-10-CM

## 2020-09-18 NOTE — Patient Instructions (Addendum)
Medication Instructions:  Continue current medications  *If you need a refill on your cardiac medications before your next appointment, please call your pharmacy*   Lab Work: [facility]   Testing/Procedures: Your physician has requested that you have a CT Angio. This test is done at [place] at [place]: At [facility], you and your health needs are our priority.  As part of our continuing mission to provide you with exceptional heart care, we have created designated Provider Care Teams.  These Care Teams include your primary Cardiologist (physician) and Advanced Practice Providers (APPs -  Physician Assistants and Nurse Practitioners) who all work together to provide you with the care you need, when you need it.  We recommend signing up for the patient portal called "MyChart".  Sign up information is provided on this After Visit Summary.  MyChart is used to connect with patients for Virtual Visits (Telemedicine).  Patients are able to view lab/test results, encounter notes, upcoming appointments, etc.  Non-urgent messages can be sent to your provider as well.   To learn more about what you can do with MyChart, go to [email or web address].    Your next appointment:   3 month(s)  The format for your next appointment:   In Person  Provider:   You may see Oswaldo Milian, MD or one of the following Advanced Practice Providers on your designated Care Team:    Rosaria Ferries, PA-C  Jory Sims, DNP, ANP    Other Instructions Keep blood pressure check 2 time a day for 1 week

## 2020-09-19 LAB — BASIC METABOLIC PANEL
BUN/Creatinine Ratio: 17 (ref 10–24)
BUN: 19 mg/dL (ref 8–27)
CO2: 25 mmol/L (ref 20–29)
Calcium: 9.5 mg/dL (ref 8.6–10.2)
Chloride: 104 mmol/L (ref 96–106)
Creatinine, Ser: 1.13 mg/dL (ref 0.76–1.27)
GFR calc Af Amer: 71 mL/min/{1.73_m2} (ref >59)
GFR calc non Af Amer: 61 mL/min/{1.73_m2} (ref >59)
Glucose: 74 mg/dL (ref 65–99)
Potassium: 4.9 mmol/L (ref 3.5–5.2)
Sodium: 141 mmol/L (ref 134–144)

## 2020-09-28 NOTE — Telephone Encounter (Signed)
BP above goal less than 130/80, would increase amlodipine to 10 mg daily

## 2020-10-08 ENCOUNTER — Other Ambulatory Visit: Payer: Self-pay

## 2020-10-08 ENCOUNTER — Ambulatory Visit
Admission: RE | Admit: 2020-10-08 | Discharge: 2020-10-08 | Disposition: A | Discharge status: Home / Self Care | Payer: Medicare Other | Source: Ambulatory Visit | Attending: Cardiology | Admitting: Cardiology

## 2020-10-08 DIAGNOSIS — I77819 Aortic ectasia, unspecified site: Secondary | ICD-10-CM

## 2020-10-08 MED ORDER — IOPAMIDOL (ISOVUE-370) INJECTION 76%
75.0000 mL | Freq: Once | INTRAVENOUS | Status: AC | PRN
  Administered 2020-10-08: 75 mL via INTRAVENOUS

## 2020-10-12 ENCOUNTER — Other Ambulatory Visit: Payer: Self-pay | Admitting: *Deleted

## 2020-10-12 DIAGNOSIS — I77819 Aortic ectasia, unspecified site: Secondary | ICD-10-CM

## 2020-10-27 NOTE — Telephone Encounter (Signed)
BP looks okay, no changes at this time (though would clarify about the SBP 242, was this actually 142?)

## 2020-11-04 DIAGNOSIS — Z961 Presence of intraocular lens: Secondary | ICD-10-CM | POA: Diagnosis not present

## 2020-11-04 DIAGNOSIS — H40013 Open angle with borderline findings, low risk, bilateral: Secondary | ICD-10-CM | POA: Diagnosis not present

## 2020-12-20 NOTE — Progress Notes (Unsigned)
Cardiology Office Note:    Date:  12/22/2020   ID:  Jose Hood, DOB 1941-08-18, MRN [id number]  PCP:  London Pepper, MD  Cardiologist:  No primary care provider on file.  Electrophysiologist:  None   Referring MD: London Pepper, MD   Chief Complaint  Patient presents with  . Aortic dilatation    History of Present Illness:    Jose Hood is a 80 y.o. male with a hx of hypertension who presents for follow-up.  He was referred by Dr. Orland Mustard for evaluation of heart murmur, initially seen on 09/18/2020.  He reports no chest pain or dyspnea.  Reports he walks 30 minutes/day.  He denies any lightheadedness, syncope, or palpitations.  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  Reports BP has been controlled at home but sometimes up to 140s.  Never smoked.  Father died of MI at age 21.  Echocardiogram on 11/21 shows mild AI, aortic valve sclerosis without stenosis (though gradient may be underestimated due to suboptimal Doppler alignment), moderate dilatation of ascending aorta measuring 58mm, normal biventricular function.  CTA chest on 10/08/2020 showed ascending aorta measured 43 mm.  Since last clinic visit, he reports that he is doing well.  Denies any chest pain, dyspnea, lightheadedness, syncope, lower extremity edema, or palpitations.  Reports BP in 130/80s.    Hasn't been walking recently.    Past Medical History:  Diagnosis Date  . Gout   . Hypertension   . Shingles 07/20/2016    Past Surgical History:  Procedure Laterality Date  . NO PAST SURGERIES      Current Medications: Current Meds  Medication Sig  . amLODipine (NORVASC) 5 MG tablet Take 10 mg by mouth daily.  . finasteride (PROSCAR) 5 MG tablet TAKE 1 TABLET BY MOUTH EVERY DAY  . lisinopril (PRINIVIL,ZESTRIL) 40 MG tablet Take 1 tablet (40 mg total) by mouth daily.  . probenecid (BENEMID) 500 MG tablet Take 1 tablet (500 mg total) by  mouth daily.  . propranolol ER (INDERAL LA) 160 MG SR capsule Take 1 capsule (160 mg total) by mouth daily.  . tamsulosin (FLOMAX) 0.4 MG CAPS capsule TAKE ONE CAPSULE BY MOUTH EVERY DAY   Current Facility-Administered Medications for the 12/22/20 encounter (Office Visit) with Donato Heinz, MD  Medication  . 0.9 %  sodium chloride infusion     Allergies:   Metronidazole   Social History   Socioeconomic History  . Marital status: Married    Spouse name: Not on file  . Number of children: Not on file  . Years of education: Not on file  . Highest education level: Not on file  Occupational History  . Not on file  Tobacco Use  . Smoking status: Never Smoker  . Smokeless tobacco: Never Used  Substance and Sexual Activity  . Alcohol use: No  . Drug use: No  . Sexual activity: Not on file  Other Topics Concern  . Not on file  Social History Narrative  . Not on file   Social Determinants of Health   Financial Resource Strain: Not on file  Food Insecurity: Not on file  Transportation Needs: Not on file  Physical Activity: Not on file  Stress: Not on file  Social Connections: Not on file     Family History: The patient's family history is negative for Colon cancer.  ROS:   Please see the history of present  illness.     All other systems reviewed and are negative.  EKGs/Labs/Other Studies Reviewed:    The following studies were reviewed today:   EKG:  EKG is not ordered today.  The ekg ordered at prior clinic visit demonstrates sinus bradycardia, rate 59, no ST/T abnormality  Recent Labs: 09/18/2020: BUN 19; Creatinine, Ser 1.13; Potassium 4.9; Sodium 141  Recent Lipid Panel    Component Value Date/Time   CHOL 153 06/19/2018 0830   TRIG 192.0 (H) 06/19/2018 0830   HDL 31.20 (L) 06/19/2018 0830   CHOLHDL 5 06/19/2018 0830   VLDL 38.4 06/19/2018 0830   LDLCALC 83 06/19/2018 0830   LDLDIRECT 56.0 06/16/2017 0916    Physical Exam:    VS:  BP 137/73    Pulse 62   Ht 6\' 1"  (1.854 m)   Wt 203 lb 12.8 oz (92.4 kg)   SpO2 100% Comment: 100  BMI 26.89 kg/m     Wt Readings from Last 3 Encounters:  12/22/20 203 lb 12.8 oz (92.4 kg)  09/18/20 201 lb (91.2 kg)  06/19/18 200 lb (90.7 kg)     GEN: Well nourished, well developed in no acute distress HEENT: Normal NECK: No JVD; No carotid bruits LYMPHATICS: No lymphadenopathy CARDIAC: RRR, 2/6 systolic murmur RESPIRATORY:  Clear to auscultation without rales, wheezing or rhonchi  ABDOMEN: Soft, non-tender, non-distended MUSCULOSKELETAL:  No edema; No deformity  SKIN: Warm and dry NEUROLOGIC:  Alert and oriented x 3 PSYCHIATRIC:  Normal affect   ASSESSMENT:    1. Aortic dilatation (HCC)   2. Aortic valve insufficiency, etiology of cardiac valve disease unspecified   3. Essential hypertension    PLAN:    Ascending aortic dilatation: measured 62mm on echo.  CTA chest on 10/08/2020 showed ascending aorta measured 43 mm.  Repeat CTA for monitoring in 1 year (09/2021)  Mild AI: on echo 08/2020.  Aortic sclerosis but no stenosis (may be underestimated due to suboptimal Doppler alignment).  Will monitor   Hypertension: Continue amlodipine 10 mg daily, lisinopril 40 mg daily, propranolol 160 mg.   RTC in 1 year  Medication Adjustments/Labs and Tests Ordered: Current medicines are reviewed at length with the patient today.  Concerns regarding medicines are outlined above.  No orders of the defined types were placed in this encounter.  No orders of the defined types were placed in this encounter.   Patient Instructions  Medication Instructions:  Your physician recommends that you continue on your current medications as directed. Please refer to the Current Medication list given to you today.  *If you need a refill on your cardiac medications before your next appointment, please call your pharmacy*  Testing/Procedures: CTA chest/aorta in December 2022  Follow-Up: At [facility],  you and your health needs are our priority.  As part of our continuing mission to provide you with exceptional heart care, we have created designated Provider Care Teams.  These Care Teams include your primary Cardiologist (physician) and Advanced Practice Providers (APPs -  Physician Assistants and Nurse Practitioners) who all work together to provide you with the care you need, when you need it.  We recommend signing up for the patient portal called "MyChart".  Sign up information is provided on this After Visit Summary.  MyChart is used to connect with patients for Virtual Visits (Telemedicine).  Patients are able to view lab/test results, encounter notes, upcoming appointments, etc.  Non-urgent messages can be sent to your provider as well.   To learn more about  what you can do with MyChart, go to [email or web address].    Your next appointment:   12 month(s)  The format for your next appointment:   In Person  Provider:   Oswaldo Milian, MD         Signed, Donato Heinz, MD  12/22/2020 10:25 AM    [place]

## 2020-12-22 ENCOUNTER — Other Ambulatory Visit: Payer: Self-pay

## 2020-12-22 ENCOUNTER — Encounter: Payer: Self-pay | Admitting: Cardiology

## 2020-12-22 ENCOUNTER — Ambulatory Visit: Payer: Medicare Other | Admitting: Cardiology

## 2020-12-22 VITALS — BP 137/73 | HR 62 | Ht 73.0 in | Wt 203.8 lb

## 2020-12-22 DIAGNOSIS — I77819 Aortic ectasia, unspecified site: Secondary | ICD-10-CM | POA: Diagnosis not present

## 2020-12-22 DIAGNOSIS — I351 Nonrheumatic aortic (valve) insufficiency: Secondary | ICD-10-CM | POA: Diagnosis not present

## 2020-12-22 DIAGNOSIS — I10 Essential (primary) hypertension: Secondary | ICD-10-CM

## 2020-12-22 NOTE — Patient Instructions (Signed)
Medication Instructions:  Your physician recommends that you continue on your current medications as directed. Please refer to the Current Medication list given to you today.  *If you need a refill on your cardiac medications before your next appointment, please call your pharmacy*  Testing/Procedures: CTA chest/aorta in December 2022  Follow-Up: At [facility], you and your health needs are our priority.  As part of our continuing mission to provide you with exceptional heart care, we have created designated Provider Care Teams.  These Care Teams include your primary Cardiologist (physician) and Advanced Practice Providers (APPs -  Physician Assistants and Nurse Practitioners) who all work together to provide you with the care you need, when you need it.  We recommend signing up for the patient portal called "MyChart".  Sign up information is provided on this After Visit Summary.  MyChart is used to connect with patients for Virtual Visits (Telemedicine).  Patients are able to view lab/test results, encounter notes, upcoming appointments, etc.  Non-urgent messages can be sent to your provider as well.   To learn more about what you can do with MyChart, go to [email or web address].    Your next appointment:   12 month(s)  The format for your next appointment:   In Person  Provider:   Oswaldo Milian, MD

## 2020-12-23 DIAGNOSIS — D485 Neoplasm of uncertain behavior of skin: Secondary | ICD-10-CM | POA: Diagnosis not present

## 2020-12-23 DIAGNOSIS — L812 Freckles: Secondary | ICD-10-CM | POA: Diagnosis not present

## 2020-12-23 DIAGNOSIS — D1801 Hemangioma of skin and subcutaneous tissue: Secondary | ICD-10-CM | POA: Diagnosis not present

## 2020-12-23 DIAGNOSIS — D2271 Melanocytic nevi of right lower limb, including hip: Secondary | ICD-10-CM | POA: Diagnosis not present

## 2020-12-23 DIAGNOSIS — Z85828 Personal history of other malignant neoplasm of skin: Secondary | ICD-10-CM | POA: Diagnosis not present

## 2020-12-23 DIAGNOSIS — D225 Melanocytic nevi of trunk: Secondary | ICD-10-CM | POA: Diagnosis not present

## 2020-12-23 DIAGNOSIS — L821 Other seborrheic keratosis: Secondary | ICD-10-CM | POA: Diagnosis not present

## 2020-12-23 DIAGNOSIS — D2272 Melanocytic nevi of left lower limb, including hip: Secondary | ICD-10-CM | POA: Diagnosis not present

## 2020-12-23 DIAGNOSIS — L570 Actinic keratosis: Secondary | ICD-10-CM | POA: Diagnosis not present

## 2020-12-23 DIAGNOSIS — C44319 Basal cell carcinoma of skin of other parts of face: Secondary | ICD-10-CM | POA: Diagnosis not present

## 2021-02-25 DIAGNOSIS — E785 Hyperlipidemia, unspecified: Secondary | ICD-10-CM | POA: Diagnosis not present

## 2021-02-25 DIAGNOSIS — Z0000 Encounter for general adult medical examination without abnormal findings: Secondary | ICD-10-CM | POA: Diagnosis not present

## 2021-02-25 DIAGNOSIS — I77819 Aortic ectasia, unspecified site: Secondary | ICD-10-CM | POA: Diagnosis not present

## 2021-02-25 DIAGNOSIS — I10 Essential (primary) hypertension: Secondary | ICD-10-CM | POA: Diagnosis not present

## 2021-02-25 DIAGNOSIS — K5900 Constipation, unspecified: Secondary | ICD-10-CM | POA: Diagnosis not present

## 2021-04-27 DIAGNOSIS — H90A21 Sensorineural hearing loss, unilateral, right ear, with restricted hearing on the contralateral side: Secondary | ICD-10-CM | POA: Diagnosis not present

## 2021-06-30 DIAGNOSIS — Z85828 Personal history of other malignant neoplasm of skin: Secondary | ICD-10-CM | POA: Diagnosis not present

## 2021-06-30 DIAGNOSIS — L812 Freckles: Secondary | ICD-10-CM | POA: Diagnosis not present

## 2021-06-30 DIAGNOSIS — C44319 Basal cell carcinoma of skin of other parts of face: Secondary | ICD-10-CM | POA: Diagnosis not present

## 2021-06-30 DIAGNOSIS — D485 Neoplasm of uncertain behavior of skin: Secondary | ICD-10-CM | POA: Diagnosis not present

## 2021-06-30 DIAGNOSIS — D1801 Hemangioma of skin and subcutaneous tissue: Secondary | ICD-10-CM | POA: Diagnosis not present

## 2021-06-30 DIAGNOSIS — L821 Other seborrheic keratosis: Secondary | ICD-10-CM | POA: Diagnosis not present

## 2021-08-03 DIAGNOSIS — C44319 Basal cell carcinoma of skin of other parts of face: Secondary | ICD-10-CM | POA: Diagnosis not present

## 2021-09-01 DIAGNOSIS — I77819 Aortic ectasia, unspecified site: Secondary | ICD-10-CM | POA: Diagnosis not present

## 2021-09-01 DIAGNOSIS — I700 Atherosclerosis of aorta: Secondary | ICD-10-CM | POA: Diagnosis not present

## 2021-09-01 DIAGNOSIS — K5900 Constipation, unspecified: Secondary | ICD-10-CM | POA: Diagnosis not present

## 2021-09-01 DIAGNOSIS — I10 Essential (primary) hypertension: Secondary | ICD-10-CM | POA: Diagnosis not present

## 2021-09-14 ENCOUNTER — Other Ambulatory Visit: Payer: Self-pay | Admitting: *Deleted

## 2021-09-14 DIAGNOSIS — I77819 Aortic ectasia, unspecified site: Secondary | ICD-10-CM

## 2021-09-14 DIAGNOSIS — Z01812 Encounter for preprocedural laboratory examination: Secondary | ICD-10-CM

## 2021-09-27 ENCOUNTER — Other Ambulatory Visit: Payer: Self-pay

## 2021-09-27 ENCOUNTER — Ambulatory Visit (HOSPITAL_COMMUNITY)
Admission: RE | Admit: 2021-09-27 | Discharge: 2021-09-27 | Disposition: A | Discharge status: Home / Self Care | Payer: Medicare Other | Source: Ambulatory Visit | Attending: Cardiology | Admitting: Cardiology

## 2021-09-27 DIAGNOSIS — I712 Thoracic aortic aneurysm, without rupture, unspecified: Secondary | ICD-10-CM | POA: Diagnosis not present

## 2021-09-27 DIAGNOSIS — I77819 Aortic ectasia, unspecified site: Secondary | ICD-10-CM | POA: Diagnosis not present

## 2021-09-27 DIAGNOSIS — I517 Cardiomegaly: Secondary | ICD-10-CM | POA: Diagnosis not present

## 2021-09-27 LAB — POCT I-STAT CREATININE: Creatinine, Ser: 1.2 mg/dL (ref 0.61–1.24)

## 2021-09-27 MED ORDER — IOHEXOL 350 MG/ML SOLN
100.0000 mL | Freq: Once | INTRAVENOUS | Status: AC | PRN
  Administered 2021-09-27: 100 mL via INTRAVENOUS

## 2021-10-01 DIAGNOSIS — E785 Hyperlipidemia, unspecified: Secondary | ICD-10-CM | POA: Diagnosis not present

## 2021-10-01 DIAGNOSIS — I10 Essential (primary) hypertension: Secondary | ICD-10-CM | POA: Diagnosis not present

## 2021-10-06 ENCOUNTER — Encounter: Payer: Self-pay | Admitting: *Deleted

## 2021-11-04 DIAGNOSIS — Z961 Presence of intraocular lens: Secondary | ICD-10-CM | POA: Diagnosis not present

## 2021-11-04 DIAGNOSIS — H40013 Open angle with borderline findings, low risk, bilateral: Secondary | ICD-10-CM | POA: Diagnosis not present

## 2021-11-09 DIAGNOSIS — I10 Essential (primary) hypertension: Secondary | ICD-10-CM | POA: Diagnosis not present

## 2021-11-09 DIAGNOSIS — E785 Hyperlipidemia, unspecified: Secondary | ICD-10-CM | POA: Diagnosis not present

## 2021-11-26 DIAGNOSIS — R3912 Poor urinary stream: Secondary | ICD-10-CM | POA: Diagnosis not present

## 2022-01-04 DIAGNOSIS — C44319 Basal cell carcinoma of skin of other parts of face: Secondary | ICD-10-CM | POA: Diagnosis not present

## 2022-01-04 DIAGNOSIS — D2271 Melanocytic nevi of right lower limb, including hip: Secondary | ICD-10-CM | POA: Diagnosis not present

## 2022-01-04 DIAGNOSIS — Z85828 Personal history of other malignant neoplasm of skin: Secondary | ICD-10-CM | POA: Diagnosis not present

## 2022-01-04 DIAGNOSIS — D1801 Hemangioma of skin and subcutaneous tissue: Secondary | ICD-10-CM | POA: Diagnosis not present

## 2022-01-04 DIAGNOSIS — D225 Melanocytic nevi of trunk: Secondary | ICD-10-CM | POA: Diagnosis not present

## 2022-01-04 DIAGNOSIS — L812 Freckles: Secondary | ICD-10-CM | POA: Diagnosis not present

## 2022-01-04 DIAGNOSIS — D2272 Melanocytic nevi of left lower limb, including hip: Secondary | ICD-10-CM | POA: Diagnosis not present

## 2022-01-04 DIAGNOSIS — L570 Actinic keratosis: Secondary | ICD-10-CM | POA: Diagnosis not present

## 2022-01-04 DIAGNOSIS — D485 Neoplasm of uncertain behavior of skin: Secondary | ICD-10-CM | POA: Diagnosis not present

## 2022-01-04 DIAGNOSIS — L821 Other seborrheic keratosis: Secondary | ICD-10-CM | POA: Diagnosis not present

## 2022-01-18 NOTE — Progress Notes (Signed)
?Cardiology Office Note:   ? ?Date:  01/19/2022  ? ?IDKarthikeya Hood, DOB 1941-09-22, MRN [id number] ? ?PCP:  London Pepper, MD  ?Cardiologist:  None  ?Electrophysiologist:  None  ? ?Referring MD: London Pepper, MD  ? ?Chief Complaint  ?Patient presents with  ? Dilated aorta  ? ? ?History of Present Illness:   ? ?Jose Hood is a 81 y.o. male with a hx of hypertension who presents for follow-up.  He was referred by Dr. Orland Mustard for evaluation of heart murmur, initially seen on 09/18/2020.  He reports no chest pain or dyspnea.  Reports he walks 30 minutes/day.  He denies any lightheadedness, syncope, or palpitations.  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  Reports BP has been controlled at home but sometimes up to 140s.  Never smoked.  Father died of MI at age 21. ? ?Echocardiogram on 11/21 shows mild AI, aortic valve sclerosis without stenosis (though gradient may be underestimated due to suboptimal Doppler alignment), moderate dilatation of ascending aorta measuring 75m, normal biventricular function.  CTA chest on 10/08/2020 showed ascending aorta measured 43 mm.  CTA chest 09/2021 showed stable 43 mm ascending aorta ? ?Since last clinic visit, he reports he has been doing well.  Denies any chest pain, dyspnea, lightheadedness, syncope, lower extremity edema, or palpitations. Has not been exercising.  ? ?Past Medical History:  ?Diagnosis Date  ? Gout   ? Hypertension   ? Shingles 07/20/2016  ? ? ?Past Surgical History:  ?Procedure Laterality Date  ? NO PAST SURGERIES    ? ? ?Current Medications: ?Current Meds  ?Medication Sig  ? amLODipine (NORVASC) 5 MG tablet Take 10 mg by mouth daily.  ? carvedilol (COREG) 6.25 MG tablet Take 1 tablet (6.25 mg total) by mouth 2 (two) times daily.  ? finasteride (PROSCAR) 5 MG tablet TAKE 1 TABLET BY MOUTH EVERY DAY  ? lisinopril (PRINIVIL,ZESTRIL) 40 MG tablet Take 1 tablet (40 mg total) by mouth  daily.  ? probenecid (BENEMID) 500 MG tablet Take 1 tablet (500 mg total) by mouth daily.  ? tamsulosin (FLOMAX) 0.4 MG CAPS capsule TAKE ONE CAPSULE BY MOUTH EVERY DAY  ? [DISCONTINUED] propranolol ER (INDERAL LA) 160 MG SR capsule Take 1 capsule (160 mg total) by mouth daily.  ? ?Current Facility-Administered Medications for the 01/19/22 encounter (Office Visit) with SDonato Heinz MD  ?Medication  ? 0.9 %  sodium chloride infusion  ?  ? ?Allergies:   Metronidazole  ? ?Social History  ? ?Socioeconomic History  ? Marital status: Married  ?  Spouse name: Not on file  ? Number of children: Not on file  ? Years of education: Not on file  ? Highest education level: Not on file  ?Occupational History  ? Not on file  ?Tobacco Use  ? Smoking status: Never  ? Smokeless tobacco: Never  ?Substance and Sexual Activity  ? Alcohol use: No  ? Drug use: No  ? Sexual activity: Not on file  ?Other Topics Concern  ? Not on file  ?Social History Narrative  ? Not on file  ? ?Social Determinants of Health  ? ?Financial Resource Strain: Not on file  ?Food Insecurity: Not on file  ?Transportation Needs: Not on file  ?Physical Activity: Not on file  ?Stress: Not on file  ?Social Connections: Not on file  ?  ? ?Family History: ?The patient's family history is  negative for Colon cancer. ? ?ROS:   ?Please see the history of present illness.    ? All other systems reviewed and are negative. ? ?EKGs/Labs/Other Studies Reviewed:   ? ?The following studies were reviewed today: ? ? ?EKG:   ?01/19/22:sinus bradycardia, rate 49, early repol ? ?Recent Labs: ?09/27/2021: Creatinine, Ser 1.20  ?Recent Lipid Panel ?   ?Component Value Date/Time  ? CHOL 153 06/19/2018 0830  ? TRIG 192.0 (H) 06/19/2018 0830  ? HDL 31.20 (L) 06/19/2018 0830  ? CHOLHDL 5 06/19/2018 0830  ? VLDL 38.4 06/19/2018 0830  ? Bishop 83 06/19/2018 0830  ? LDLDIRECT 56.0 06/16/2017 0916  ? ? ?Physical Exam:   ? ?VS:  BP 128/80   Pulse (!) 49   Ht '6\' 1"'$  (1.854 m)   Wt 199  lb 6.4 oz (90.4 kg)   SpO2 94%   BMI 26.31 kg/m?    ? ?Wt Readings from Last 3 Encounters:  ?01/19/22 199 lb 6.4 oz (90.4 kg)  ?12/22/20 203 lb 12.8 oz (92.4 kg)  ?09/18/20 201 lb (91.2 kg)  ?  ? ?GEN: Well nourished, well developed in no acute distress ?HEENT: Normal ?NECK: No JVD; No carotid bruits ?LYMPHATICS: No lymphadenopathy ?CARDIAC: RRR, 2/6 systolic murmur ?RESPIRATORY:  Clear to auscultation without rales, wheezing or rhonchi  ?ABDOMEN: Soft, non-tender, non-distended ?MUSCULOSKELETAL:  No edema; No deformity  ?SKIN: Warm and dry ?NEUROLOGIC:  Alert and oriented x 3 ?PSYCHIATRIC:  Normal affect  ? ?ASSESSMENT:   ? ?1. Aortic dilatation (HCC)   ?2. Aortic valve insufficiency, etiology of cardiac valve disease unspecified   ?3. Essential hypertension   ?4. Bradycardia   ? ? ?PLAN:   ? ?Ascending aortic dilatation: measured 4m on echo.  CTA chest on 10/08/2020 showed ascending aorta measured 43 mm.  CTA chest 09/2021 showed stable 43 mm ascending aorta.  Continue to monitor ? ?Mild AI: on echo 08/2020.  Aortic sclerosis but no stenosis (may be underestimated due to suboptimal Doppler alignment).  Will monitor  ? ?Hypertension: Continue amlodipine 10 mg daily, lisinopril 40 mg daily.  Heart rate in high 40s in clinic today, will discontinue propranolol 160 mg daily and switch to carvedilol 6.25 mg twice daily.  Asked to check BP/heart rate daily for next 2 weeks and call with results ? ?RTC in 6 months ? ?Medication Adjustments/Labs and Tests Ordered: ?Current medicines are reviewed at length with the patient today.  Concerns regarding medicines are outlined above.  ?Orders Placed This Encounter  ?Procedures  ? CT ANGIO CHEST AORTA W/CM & OR WO/CM  ? EKG 12-Lead  ? ?Meds ordered this encounter  ?Medications  ? carvedilol (COREG) 6.25 MG tablet  ?  Sig: Take 1 tablet (6.25 mg total) by mouth 2 (two) times daily.  ?  Dispense:  180 tablet  ?  Refill:  3  ?  STOP propranolol  ? ? ?Patient Instructions   ?Medication Instructions:  ?STOP propranolol ?START carvedilol (Coreg) 6.25 mg two times daily ? ?Please check your blood pressure and heart rate at home twice daily, write it down.  Call the office or send message via Mychart with the readings in 2 weeks for Dr. SGardiner Rhymeto review.  ? ?*If you need a refill on your cardiac medications before your next appointment, please call your pharmacy* ? ?Testing/Procedures: ?CTA chest/aorta-DUE December 2023 ? ?Follow-Up: ?[facility] and [facility] are our [facility].  As part of our continuing mission to provide you with exceptional  heart care, we have created designated Provider Care Teams.  These Care Teams include your primary Cardiologist (physician) and Advanced Practice Providers (APPs -  Physician Assistants and Nurse Practitioners) who all work together to provide you with the care you need, when you need it. ? ?We recommend signing up for the patient portal called "MyChart".  Sign up information is provided on this After Visit Summary.  MyChart is used to connect with patients for Virtual Visits (Telemedicine).  Patients are able to view lab/test results, encounter notes, upcoming appointments, etc.  Non-urgent messages can be sent to your provider as well.   ?To learn more about what you can do with MyChart, go to [email or web address].   ? ?Your next appointment:   ?12 month(s) ? ?The format for your next appointment:   ?In Person ? ?Provider:   ?Dr. Gardiner Rhyme ?  ? ?Signed, ?Donato Heinz, MD  ?01/19/2022 6:42 PM    ?[place] ?

## 2022-01-19 ENCOUNTER — Ambulatory Visit: Payer: Medicare Other | Admitting: Cardiology

## 2022-01-19 ENCOUNTER — Encounter: Payer: Self-pay | Admitting: Cardiology

## 2022-01-19 VITALS — BP 128/80 | HR 49 | Ht 73.0 in | Wt 199.4 lb

## 2022-01-19 DIAGNOSIS — I77819 Aortic ectasia, unspecified site: Secondary | ICD-10-CM | POA: Diagnosis not present

## 2022-01-19 DIAGNOSIS — I10 Essential (primary) hypertension: Secondary | ICD-10-CM

## 2022-01-19 DIAGNOSIS — I351 Nonrheumatic aortic (valve) insufficiency: Secondary | ICD-10-CM | POA: Diagnosis not present

## 2022-01-19 DIAGNOSIS — R001 Bradycardia, unspecified: Secondary | ICD-10-CM

## 2022-01-19 MED ORDER — CARVEDILOL 6.25 MG PO TABS: 6.2500 mg | ORAL_TABLET | Freq: Two times a day (BID) | ORAL | 3 refills | Status: DC

## 2022-01-19 NOTE — Patient Instructions (Signed)
Medication Instructions:  ?STOP propranolol ?START carvedilol (Coreg) 6.25 mg two times daily ? ?Please check your blood pressure and heart rate at home twice daily, write it down.  Call the office or send message via Mychart with the readings in 2 weeks for Dr. Gardiner Rhyme to review.  ? ?*If you need a refill on your cardiac medications before your next appointment, please call your pharmacy* ? ?Testing/Procedures: ?CTA chest/aorta-DUE December 2023 ? ?Follow-Up: ?At [facility], you and your health needs are our priority.  As part of our continuing mission to provide you with exceptional heart care, we have created designated Provider Care Teams.  These Care Teams include your primary Cardiologist (physician) and Advanced Practice Providers (APPs -  Physician Assistants and Nurse Practitioners) who all work together to provide you with the care you need, when you need it. ? ?We recommend signing up for the patient portal called "MyChart".  Sign up information is provided on this After Visit Summary.  MyChart is used to connect with patients for Virtual Visits (Telemedicine).  Patients are able to view lab/test results, encounter notes, upcoming appointments, etc.  Non-urgent messages can be sent to your provider as well.   ?To learn more about what you can do with MyChart, go to [email or web address].   ? ?Your next appointment:   ?12 month(s) ? ?The format for your next appointment:   ?In Person ? ?Provider:   ?Dr. Gardiner Rhyme ? ?

## 2022-01-24 ENCOUNTER — Encounter: Payer: Self-pay | Admitting: Cardiology

## 2022-01-26 MED ORDER — PROPRANOLOL HCL 80 MG PO TABS: 80.0000 mg | ORAL_TABLET | Freq: Every day | ORAL | 3 refills | Status: DC

## 2022-01-26 NOTE — Telephone Encounter (Signed)
Left message for patient . If 80 mg  of medication need to sent to pharmacy. ?

## 2022-01-26 NOTE — Telephone Encounter (Signed)
Yes we can discontinue carvedilol and go back to propranolol, but would decrease dose from 160 mg to 80 mg daily ?

## 2022-01-28 ENCOUNTER — Other Ambulatory Visit: Payer: Self-pay

## 2022-01-28 MED ORDER — PROPRANOLOL HCL 80 MG PO TABS: 80.0000 mg | ORAL_TABLET | Freq: Every day | ORAL | 3 refills | Status: DC

## 2022-02-15 DIAGNOSIS — C44319 Basal cell carcinoma of skin of other parts of face: Secondary | ICD-10-CM | POA: Diagnosis not present

## 2022-03-02 DIAGNOSIS — Z0000 Encounter for general adult medical examination without abnormal findings: Secondary | ICD-10-CM | POA: Diagnosis not present

## 2022-03-02 DIAGNOSIS — I10 Essential (primary) hypertension: Secondary | ICD-10-CM | POA: Diagnosis not present

## 2022-03-02 DIAGNOSIS — I700 Atherosclerosis of aorta: Secondary | ICD-10-CM | POA: Diagnosis not present

## 2022-03-02 DIAGNOSIS — K5900 Constipation, unspecified: Secondary | ICD-10-CM | POA: Diagnosis not present

## 2022-03-02 DIAGNOSIS — E785 Hyperlipidemia, unspecified: Secondary | ICD-10-CM | POA: Diagnosis not present

## 2022-03-02 DIAGNOSIS — I77819 Aortic ectasia, unspecified site: Secondary | ICD-10-CM | POA: Diagnosis not present

## 2022-03-16 ENCOUNTER — Telehealth: Payer: Self-pay

## 2022-03-16 NOTE — Telephone Encounter (Signed)
Received referral as patient wants to switch from [place] to [place]. Called to make patient aware that if he wants to see Dr. Percival Spanish, he is only accepting new patients in [place], not at [facility].  Patient has not be released from Dr. Newman Nickels services as of yet.

## 2022-03-31 ENCOUNTER — Telehealth: Payer: Self-pay | Admitting: Cardiology

## 2022-03-31 NOTE — Telephone Encounter (Signed)
Pt wanting to do a provider switch from Dr. Gardiner Rhyme to Dr. Jenetta Downer' Nori Riis. Please advise

## 2022-04-01 NOTE — Telephone Encounter (Signed)
Okay with me 

## 2022-07-11 DIAGNOSIS — L812 Freckles: Secondary | ICD-10-CM | POA: Diagnosis not present

## 2022-07-11 DIAGNOSIS — D1801 Hemangioma of skin and subcutaneous tissue: Secondary | ICD-10-CM | POA: Diagnosis not present

## 2022-07-11 DIAGNOSIS — D2271 Melanocytic nevi of right lower limb, including hip: Secondary | ICD-10-CM | POA: Diagnosis not present

## 2022-07-11 DIAGNOSIS — L821 Other seborrheic keratosis: Secondary | ICD-10-CM | POA: Diagnosis not present

## 2022-07-11 DIAGNOSIS — Z85828 Personal history of other malignant neoplasm of skin: Secondary | ICD-10-CM | POA: Diagnosis not present

## 2022-07-11 DIAGNOSIS — D485 Neoplasm of uncertain behavior of skin: Secondary | ICD-10-CM | POA: Diagnosis not present

## 2022-07-11 DIAGNOSIS — C44319 Basal cell carcinoma of skin of other parts of face: Secondary | ICD-10-CM | POA: Diagnosis not present

## 2022-07-11 DIAGNOSIS — L570 Actinic keratosis: Secondary | ICD-10-CM | POA: Diagnosis not present

## 2022-08-04 ENCOUNTER — Other Ambulatory Visit: Payer: Self-pay | Admitting: *Deleted

## 2022-08-04 DIAGNOSIS — I77819 Aortic ectasia, unspecified site: Secondary | ICD-10-CM

## 2022-08-04 DIAGNOSIS — Z01812 Encounter for preprocedural laboratory examination: Secondary | ICD-10-CM

## 2022-08-17 DIAGNOSIS — C44319 Basal cell carcinoma of skin of other parts of face: Secondary | ICD-10-CM | POA: Diagnosis not present

## 2022-09-19 ENCOUNTER — Ambulatory Visit (HOSPITAL_BASED_OUTPATIENT_CLINIC_OR_DEPARTMENT_OTHER): Payer: Medicare Other

## 2022-09-28 NOTE — Progress Notes (Signed)
Cardiology Office Note:   Date:  10/03/2022  NAME:  Jose Hood    MRN: [id number] DOB:  [birth year]   PCP:  Jose Pepper, MD  Cardiologist:  None  Electrophysiologist:  None   Referring MD: Jose Pepper, MD   Chief Complaint  Patient presents with   Follow-up        History of Present Illness:   Jose Hood is a 81 y.o. male with a hx of ascending aortic aneurysm, AI, HTN who presents for follow-up.  He presents without complaints.  Denies chest pain or trouble breathing.  He is doing light activity around the house without any issues.  He has been seen by Dr. Nechama Hood.  Followed for ascending aortic aneurysm which is stable over the past 2 years.  He also has mild aortic regurgitation.  We discussed spacing out his surveillance a bit more.  Suspect a lot of this is due to age.  Most recent kidney profile within limits.  Cholesterol level acceptable.  BP acceptable today.  Again without complaints today.  Works for [employer] for 40+ years.  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  Denies any symptoms in office today.  He is married.  He has 3 children.  He has 5 grandchildren.  He has several great-grandchildren.  Problem List Ascending aortic aneurysm  -43 mm 2021 -43 mm 2022 2. Aortic regurgitation, mild 3. HTN 4. HLD -T chol 146, HDL 34, LDL 73, TG 238  Past Medical History: Past Medical History:  Diagnosis Date   Gout    Hypertension    Shingles 07/20/2016    Past Surgical History: Past Surgical History:  Procedure Laterality Date   HERNIA REPAIR     NO PAST SURGERIES      Current Medications: Current Meds  Medication Sig   amLODipine (NORVASC) 5 MG tablet Take 10 mg by mouth daily.   aspirin 81 MG chewable tablet 1 tab   finasteride (PROSCAR) 5 MG tablet TAKE 1 TABLET BY MOUTH EVERY DAY   lisinopril (PRINIVIL,ZESTRIL) 40 MG tablet Take 1 tablet (40 mg total) by mouth daily.   probenecid (BENEMID) 500 MG tablet Take 1 tablet (500 mg total) by mouth  daily.   propranolol (INDERAL) 80 MG tablet Take 1 tablet (80 mg total) by mouth daily.   tamsulosin (FLOMAX) 0.4 MG CAPS capsule TAKE ONE CAPSULE BY MOUTH EVERY DAY   Current Facility-Administered Medications for the 10/03/22 encounter (Office Visit) with Jose Rile, MD  Medication   0.9 %  sodium chloride infusion     Allergies:    Metronidazole   Social History: Social History   Socioeconomic History   Marital status: Married    Spouse name: Not on file   Number of children: 2   Years of education: Not on file   Highest education level: Not on file  Occupational History   Occupation: Worked for [employer]  Tobacco Use   Smoking status: Never   Smokeless tobacco: Never  Substance and Sexual Activity   Alcohol use: No   Drug use: No   Sexual activity: Not on file  Other Topics Concern   Not on file  Social History Narrative   Not on file   Social Determinants of Health   Financial Resource Strain: Not on file  Food Insecurity: Not on file  Transportation Needs: Not on file  Physical Activity: Not on file  Stress: Not on file  Social Connections: Not on file  Family History: The patient's family history includes Heart disease in his father. There is no history of Colon cancer.  ROS:   All other ROS reviewed and negative. Pertinent positives noted in the HPI.     EKGs/Labs/Other Studies Reviewed:   The following studies were personally reviewed by me today:  TTE 08/27/2020  1. The aortic valve is abnormal. There is moderate calcification of the  aortic valve. Aortic valve regurgitation is mild. Aortic valve  sclerosis/calcification is present, without any evidence of aortic  stenosis. Aortic valve mean gradient measures 8.0  mmHg. Due to angulation of aorta, Doppler alignment may be suboptimal and  gradient may be underestimated.   2. Aortic dilatation noted. There is mild dilatation of the aortic root,  measuring 43 mm. There is  moderate dilatation of the ascending aorta,  measuring 46 mm.   3. Left ventricular ejection fraction, by estimation, is 60 to 65%. The  left ventricle has normal function. The left ventricle has no regional  wall motion abnormalities. Left ventricular diastolic parameters are  consistent with Grade I diastolic  dysfunction (impaired relaxation).   4. Right ventricular systolic function is normal. The right ventricular  size is normal. There is normal pulmonary artery systolic pressure. The  estimated right ventricular systolic pressure is 34.2 mmHg.   5. The mitral valve is normal in structure. No evidence of mitral valve  regurgitation. No evidence of mitral stenosis.   6. The inferior vena cava is normal in size with greater than 50%  respiratory variability, suggesting right atrial pressure of 3 mmHg.   Recent Labs: No results found for requested labs within last 365 days.   Recent Lipid Panel    Component Value Date/Time   CHOL 153 06/19/2018 0830   TRIG 192.0 (H) 06/19/2018 0830   HDL 31.20 (L) 06/19/2018 0830   CHOLHDL 5 06/19/2018 0830   VLDL 38.4 06/19/2018 0830   LDLCALC 83 06/19/2018 0830   LDLDIRECT 56.0 06/16/2017 0916    Physical Exam:   VS:  BP 138/78   Pulse (!) 59   Ht '6\' 1"'$  (1.854 m)   Wt 199 lb 6.4 oz (90.4 kg)   SpO2 95%   BMI 26.31 kg/m    Wt Readings from Last 3 Encounters:  10/03/22 199 lb 6.4 oz (90.4 kg)  01/19/22 199 lb 6.4 oz (90.4 kg)  12/22/20 203 lb 12.8 oz (92.4 kg)    General: Well nourished, well developed, in no acute distress Head: Atraumatic, normal size  Eyes: PEERLA, EOMI  Neck: Supple, no JVD Endocrine: No thryomegaly Cardiac: Normal S1, S2; RRR; no murmurs, rubs, or gallops Lungs: Clear to auscultation bilaterally, no wheezing, rhonchi or rales  Abd: Soft, nontender, no hepatomegaly  Ext: No edema, pulses 2+ Musculoskeletal: No deformities, BUE and BLE strength normal and equal Skin: Warm and dry, no rashes   Neuro: Alert  and oriented to person, place, time, and situation, CNII-XII grossly intact, no focal deficits  Psych: Normal mood and affect   ASSESSMENT:   Jose Hood is a 81 y.o. male who presents for the following: 1. Aortic dilatation (HCC)   2. Aortic valve insufficiency, etiology of cardiac valve disease unspecified   3. Essential hypertension     PLAN:   1. Aortic dilatation (HCC) -Stable ascending aortic aneurysm.  Likely closer to the upper limits of normal for his age.  Has been 43 mm for the past 2 years.  He also has mild aortic insufficiency.  We will plan  to space this out to every 2 years.  I would like to check next year with an echo.  Measurements seem to match up.  If needed we can get a noncontrast chest CT.  Again I do favor more lenient surveillance given his age.  2. Aortic valve insufficiency, etiology of cardiac valve disease unspecified -Mild.  Repeat echo in 1 year.  3. Essential hypertension -Well-controlled on lisinopril, amlodipine and propranolol.  Denies symptoms in office.  We will continue current regimen.  Disposition: Return in about 1 year (around 10/04/2023).  Medication Adjustments/Labs and Tests Ordered: Current medicines are reviewed at length with the patient today.  Concerns regarding medicines are outlined above.  No orders of the defined types were placed in this encounter.  No orders of the defined types were placed in this encounter.   There are no Patient Instructions on file for this visit.   Time Spent with Patient: I have spent a total of 25 minutes with patient reviewing hospital notes, telemetry, EKGs, labs and examining the patient as well as establishing an assessment and plan that was discussed with the patient.  > 50% of time was spent in direct patient care.  Signed, Addison Naegeli. Audie Box, MD, [facility]  [address], [place], [place] [address] [phone]  10/03/2022 8:10 AM

## 2022-10-03 ENCOUNTER — Ambulatory Visit: Payer: Medicare Other | Attending: Cardiovascular Disease | Admitting: Cardiovascular Disease

## 2022-10-03 ENCOUNTER — Encounter: Payer: Self-pay | Admitting: Cardiovascular Disease

## 2022-10-03 VITALS — BP 138/78 | HR 59 | Ht 73.0 in | Wt 199.4 lb

## 2022-10-03 DIAGNOSIS — I77819 Aortic ectasia, unspecified site: Secondary | ICD-10-CM | POA: Diagnosis not present

## 2022-10-03 DIAGNOSIS — I351 Nonrheumatic aortic (valve) insufficiency: Secondary | ICD-10-CM | POA: Diagnosis not present

## 2022-10-03 DIAGNOSIS — I10 Essential (primary) hypertension: Secondary | ICD-10-CM

## 2022-10-03 NOTE — Patient Instructions (Signed)
Medication Instructions:  The current medical regimen is effective;  continue present plan and medications.  *If you need a refill on your cardiac medications before your next appointment, please call your pharmacy*   Testing/Procedures: Echocardiogram (12 month) - Your physician has requested that you have an echocardiogram. Echocardiography is a painless test that uses sound waves to create images of your heart. It provides your doctor with information about the size and shape of your heart and how well your heart's chambers and valves are working. This procedure takes approximately one hour. There are no restrictions for this procedure.     Follow-Up: At [facility], you and your health needs are our priority.  As part of our continuing mission to provide you with exceptional heart care, we have created designated Provider Care Teams.  These Care Teams include your primary Cardiologist (physician) and Advanced Practice Providers (APPs -  Physician Assistants and Nurse Practitioners) who all work together to provide you with the care you need, when you need it.  We recommend signing up for the patient portal called "MyChart".  Sign up information is provided on this After Visit Summary.  MyChart is used to connect with patients for Virtual Visits (Telemedicine).  Patients are able to view lab/test results, encounter notes, upcoming appointments, etc.  Non-urgent messages can be sent to your provider as well.   To learn more about what you can do with MyChart, go to [email or web address].    Your next appointment:   12 month(s)  The format for your next appointment:   In Person  Provider:   Eleonore Chiquito, MD

## 2022-10-14 ENCOUNTER — Encounter (HOSPITAL_COMMUNITY): Payer: Self-pay | Admitting: Radiology

## 2022-11-09 DIAGNOSIS — Z961 Presence of intraocular lens: Secondary | ICD-10-CM | POA: Diagnosis not present

## 2022-11-09 DIAGNOSIS — H40013 Open angle with borderline findings, low risk, bilateral: Secondary | ICD-10-CM | POA: Diagnosis not present

## 2022-11-11 ENCOUNTER — Ambulatory Visit (HOSPITAL_COMMUNITY): Payer: Medicare Other | Attending: Cardiovascular Disease

## 2022-11-11 DIAGNOSIS — I351 Nonrheumatic aortic (valve) insufficiency: Secondary | ICD-10-CM | POA: Insufficient documentation

## 2022-11-11 LAB — ECHOCARDIOGRAM COMPLETE
AR max vel: 3.4 cm2
AV Area VTI: 3.27 cm2
AV Area mean vel: 3.27 cm2
AV Mean grad: 9 mmHg
AV Peak grad: 17.1 mmHg
Ao pk vel: 2.07 m/s
Area-P 1/2: 3.17 cm2
P 1/2 time: 572 msec
S' Lateral: 2.1 cm

## 2022-12-06 DIAGNOSIS — R310 Gross hematuria: Secondary | ICD-10-CM | POA: Diagnosis not present

## 2022-12-22 DIAGNOSIS — N281 Cyst of kidney, acquired: Secondary | ICD-10-CM | POA: Diagnosis not present

## 2022-12-22 DIAGNOSIS — R310 Gross hematuria: Secondary | ICD-10-CM | POA: Diagnosis not present

## 2022-12-22 DIAGNOSIS — N200 Calculus of kidney: Secondary | ICD-10-CM | POA: Diagnosis not present

## 2022-12-28 DIAGNOSIS — R310 Gross hematuria: Secondary | ICD-10-CM | POA: Diagnosis not present

## 2023-01-11 DIAGNOSIS — L821 Other seborrheic keratosis: Secondary | ICD-10-CM | POA: Diagnosis not present

## 2023-01-11 DIAGNOSIS — D2272 Melanocytic nevi of left lower limb, including hip: Secondary | ICD-10-CM | POA: Diagnosis not present

## 2023-01-11 DIAGNOSIS — D2271 Melanocytic nevi of right lower limb, including hip: Secondary | ICD-10-CM | POA: Diagnosis not present

## 2023-01-11 DIAGNOSIS — L570 Actinic keratosis: Secondary | ICD-10-CM | POA: Diagnosis not present

## 2023-01-11 DIAGNOSIS — Z85828 Personal history of other malignant neoplasm of skin: Secondary | ICD-10-CM | POA: Diagnosis not present

## 2023-01-11 DIAGNOSIS — D0462 Carcinoma in situ of skin of left upper limb, including shoulder: Secondary | ICD-10-CM | POA: Diagnosis not present

## 2023-01-11 DIAGNOSIS — D225 Melanocytic nevi of trunk: Secondary | ICD-10-CM | POA: Diagnosis not present

## 2023-01-11 DIAGNOSIS — D2262 Melanocytic nevi of left upper limb, including shoulder: Secondary | ICD-10-CM | POA: Diagnosis not present

## 2023-02-06 IMAGING — CT CT ANGIO CHEST
2 of 5 series · 18 of 46 positions shown · IV contrast (Omni 300)
Comparison: 10/08/2020

CLINICAL DATA: Thoracic aortic aneurysm, 4.3 cm

EXAM:
CT ANGIOGRAPHY CHEST WITH CONTRAST
TECHNIQUE: Multidetector CT imaging of the chest was performed using the
standard protocol during bolus administration of intravenous
contrast. Multiplanar CT image reconstructions and MIPs were
obtained to evaluate the vascular anatomy.
CONTRAST:  100mL OMNIPAQUE IOHEXOL 350 MG/ML SOLN

[Series 5: thoracic cta 2mm · axial · 0.74mm/px · z∈[-695,-423]mm · 15 of 151 slices shown]
[im 10/151  lung]
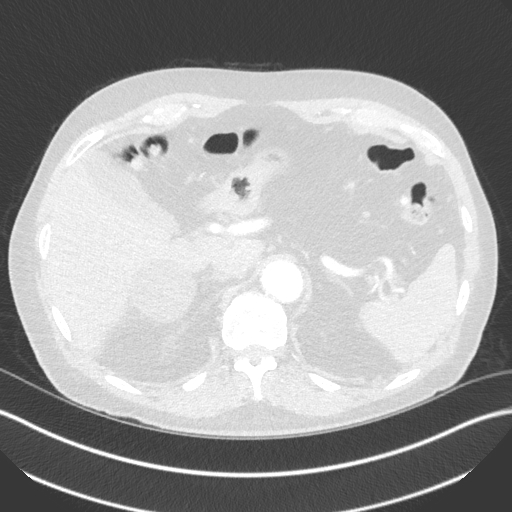
[im 20/151  soft-tissue]
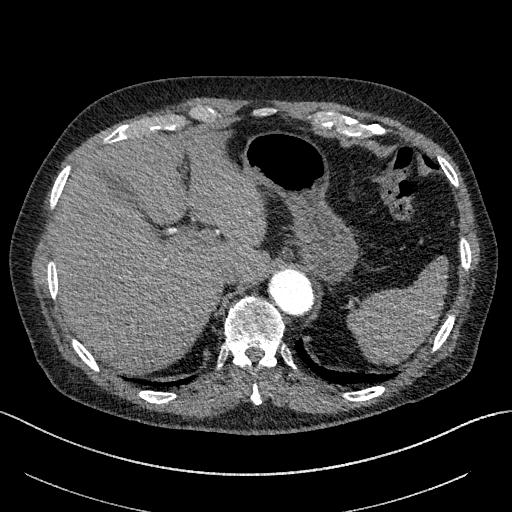
[im 30/151  lung]
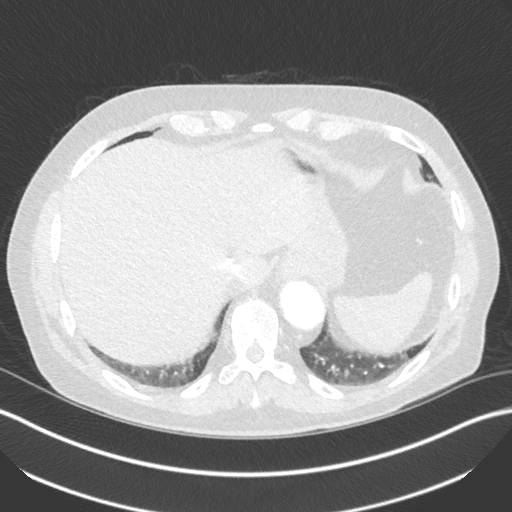
[im 39/151  soft-tissue]
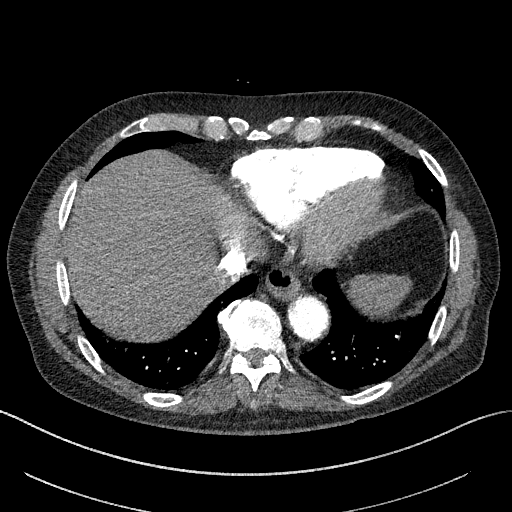
[im 49/151  lung]
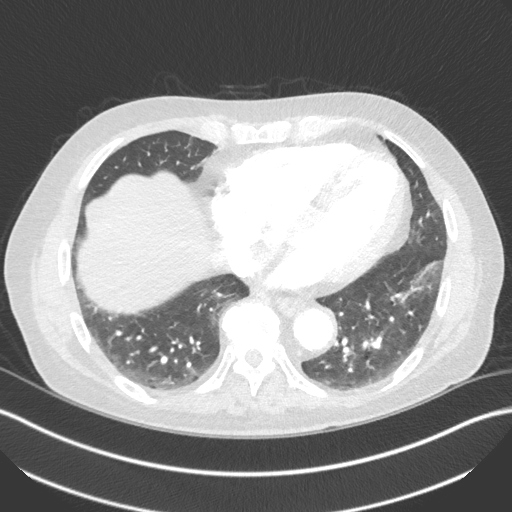
[im 59/151  soft-tissue]
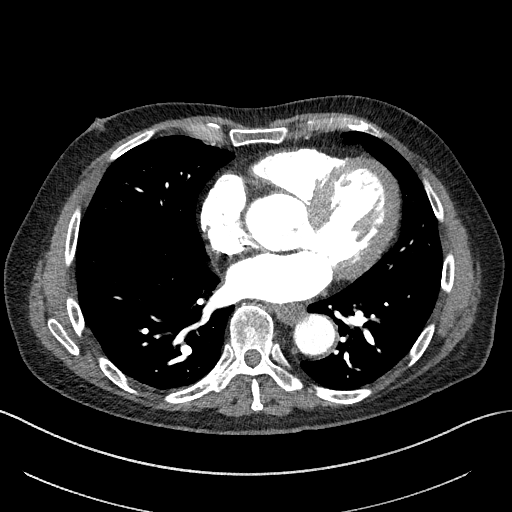
[im 68/151  lung]
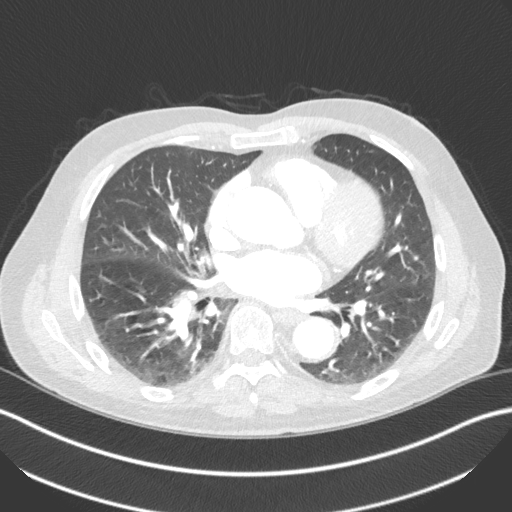
[im 78/151  soft-tissue]
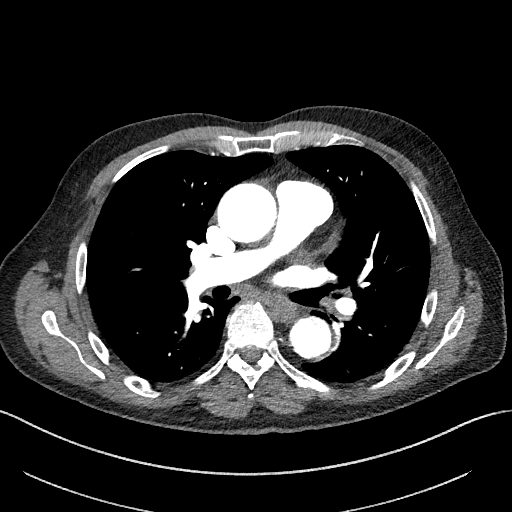
[im 88/151  lung]
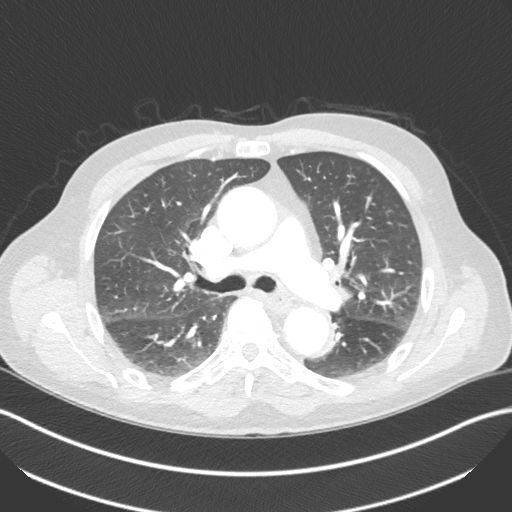
[im 97/151  soft-tissue]
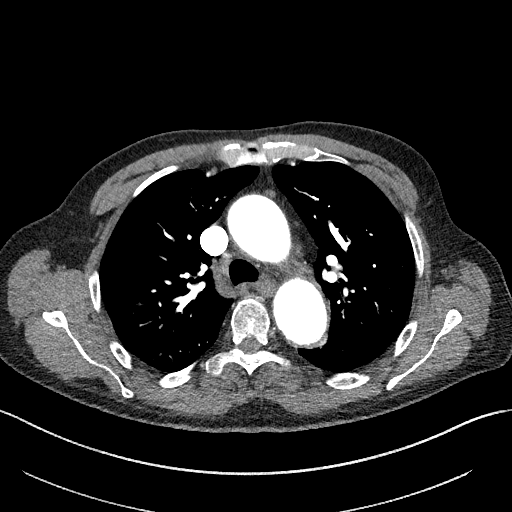
[im 107/151  lung]
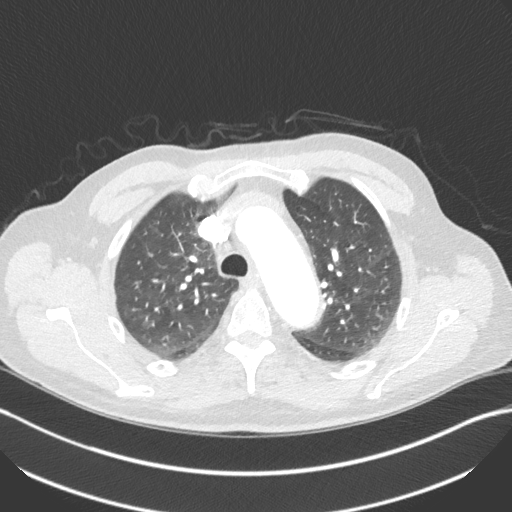
[im 117/151  soft-tissue]
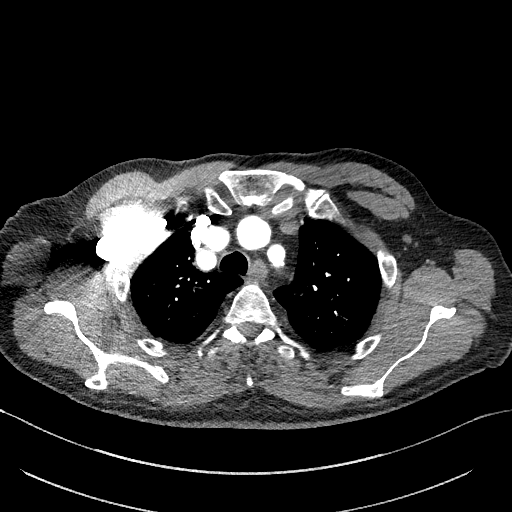
[im 126/151  lung]
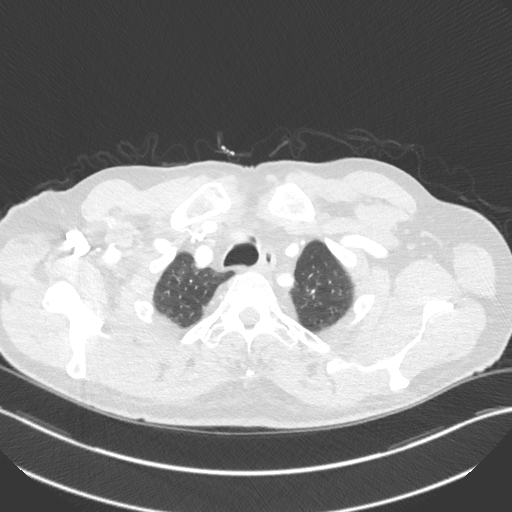
[im 136/151  soft-tissue]
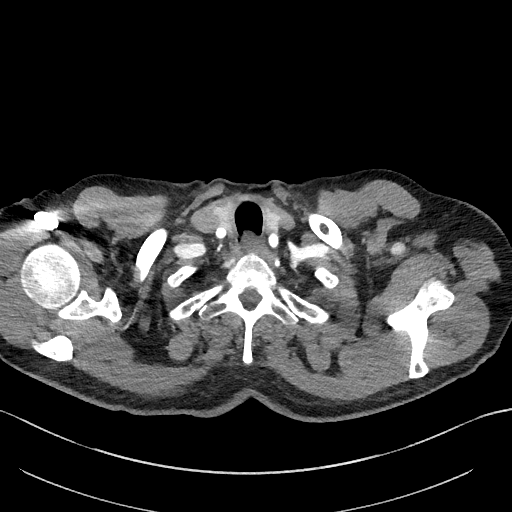
[im 146/151  lung]
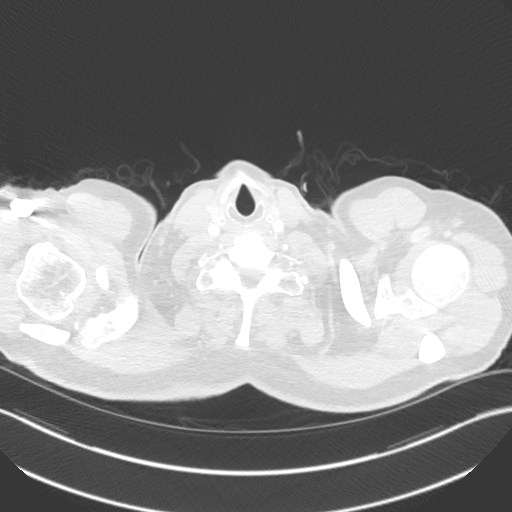

[Series 8: thoracic cta 2mm cor · coronal · 0.59mm/px · 3 of 121 slices shown]
[im 31/121  soft-tissue]
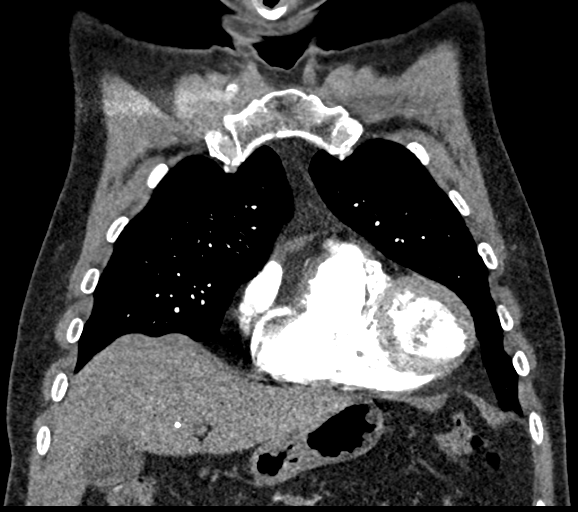
[im 61/121  soft-tissue]
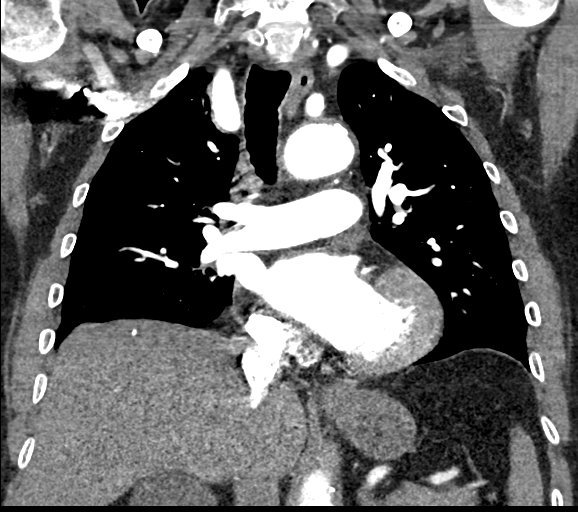
[im 91/121  soft-tissue]
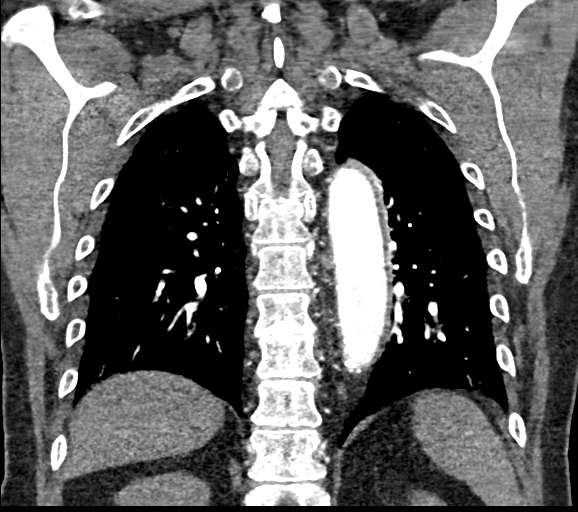

[18 of 46 positions shown; findings below may reference images not displayed]

FINDINGS: Cardiovascular: Stable mild fusiform aneurysm dilatation the
ascending thoracic aorta measuring 43 mm. Minor aortic valve
calcifications present.

Similar atherosclerotic changes. Negative for dissection. Patent 2
vessel arch anatomy. No mediastinal hemorrhage or hematoma.

Central pulmonary arteries are normal in caliber and patent.
Negative for significant acute pulmonary embolus.

Mild cardiomegaly. Native coronary atherosclerosis. No pericardial
effusion. Central venous structures are patent. No Tiger
process or anomalous pulmonary venous return.

Mediastinum/Nodes: No enlarged mediastinal, hilar, or axillary lymph
nodes. Thyroid gland, trachea, and esophagus demonstrate no
significant findings.

Lungs/Pleura: Lungs are clear. No pleural effusion or pneumothorax.

Upper Abdomen: Large exophytic right upper pole renal cyst measures
6.6 cm. Punctate calcified hepatic granulomata noted. Abdominal
aortic atherosclerosis. There are few scattered colonic diverticula.
No acute upper abdominal finding.

Musculoskeletal: Degenerative changes noted of the spine. No chest
wall abnormality appreciated.

Review of the MIP images confirms the above findings.
IMPRESSION: Stable 43 mm aneurysm of the ascending thoracic aorta.

Recommend annual imaging followup by CTA or MRA. This recommendation
follows 3545 ACCF/AHA/AATS/ACR/ASA/SCA/RUNNING/ELZANA/ARIEL/BUCUAN Guidelines
for the Diagnosis and Management of Patients with Thoracic Aortic
Disease. Circulation. 3545; 121: E266-e369. Aortic aneurysm NOS
(H8XE0-DQM.Z)

Stable aortic atherosclerosis.

No other acute intrathoracic finding.

Native coronary atherosclerosis

Aortic aneurysm NOS (H8XE0-DQM.Z).

Aortic Atherosclerosis (H8XE0-4TB.B).

## 2023-03-10 DIAGNOSIS — I10 Essential (primary) hypertension: Secondary | ICD-10-CM | POA: Diagnosis not present

## 2023-03-10 DIAGNOSIS — Z0000 Encounter for general adult medical examination without abnormal findings: Secondary | ICD-10-CM | POA: Diagnosis not present

## 2023-03-10 DIAGNOSIS — I700 Atherosclerosis of aorta: Secondary | ICD-10-CM | POA: Diagnosis not present

## 2023-03-10 DIAGNOSIS — E785 Hyperlipidemia, unspecified: Secondary | ICD-10-CM | POA: Diagnosis not present

## 2023-03-10 DIAGNOSIS — I77819 Aortic ectasia, unspecified site: Secondary | ICD-10-CM | POA: Diagnosis not present

## 2023-03-10 DIAGNOSIS — K5900 Constipation, unspecified: Secondary | ICD-10-CM | POA: Diagnosis not present

## 2023-03-10 DIAGNOSIS — Z23 Encounter for immunization: Secondary | ICD-10-CM | POA: Diagnosis not present

## 2023-07-11 DIAGNOSIS — Z85828 Personal history of other malignant neoplasm of skin: Secondary | ICD-10-CM | POA: Diagnosis not present

## 2023-07-11 DIAGNOSIS — L821 Other seborrheic keratosis: Secondary | ICD-10-CM | POA: Diagnosis not present

## 2023-07-11 DIAGNOSIS — C44319 Basal cell carcinoma of skin of other parts of face: Secondary | ICD-10-CM | POA: Diagnosis not present

## 2023-07-11 DIAGNOSIS — L570 Actinic keratosis: Secondary | ICD-10-CM | POA: Diagnosis not present

## 2023-07-15 DIAGNOSIS — Z7982 Long term (current) use of aspirin: Secondary | ICD-10-CM | POA: Diagnosis not present

## 2023-07-15 DIAGNOSIS — R1032 Left lower quadrant pain: Secondary | ICD-10-CM | POA: Diagnosis not present

## 2023-07-15 DIAGNOSIS — I10 Essential (primary) hypertension: Secondary | ICD-10-CM | POA: Diagnosis not present

## 2023-07-15 DIAGNOSIS — K529 Noninfective gastroenteritis and colitis, unspecified: Secondary | ICD-10-CM | POA: Diagnosis not present

## 2023-07-15 DIAGNOSIS — Z79899 Other long term (current) drug therapy: Secondary | ICD-10-CM | POA: Diagnosis not present

## 2023-07-15 DIAGNOSIS — K5900 Constipation, unspecified: Secondary | ICD-10-CM | POA: Insufficient documentation

## 2023-07-15 NOTE — ED Triage Notes (Signed)
Pt self ambulated to triage c/o constipation x 1 week with stomach tender to touch. Pt states he has tried OTC meds without relief. PT denies fever chills. VSS NAD PT a/o x 4 on room air.

## 2023-07-16 ENCOUNTER — Encounter (HOSPITAL_BASED_OUTPATIENT_CLINIC_OR_DEPARTMENT_OTHER): Payer: Self-pay | Admitting: Emergency Medicine

## 2023-07-16 ENCOUNTER — Other Ambulatory Visit: Payer: Self-pay

## 2023-07-16 ENCOUNTER — Emergency Department (HOSPITAL_BASED_OUTPATIENT_CLINIC_OR_DEPARTMENT_OTHER)
Admission: EM | Admit: 2023-07-16 | Discharge: 2023-07-16 | Disposition: A | Discharge status: Home / Self Care | Payer: Medicare Other | Attending: Emergency Medicine | Admitting: Emergency Medicine

## 2023-07-16 ENCOUNTER — Emergency Department (HOSPITAL_BASED_OUTPATIENT_CLINIC_OR_DEPARTMENT_OTHER): Payer: Medicare Other

## 2023-07-16 DIAGNOSIS — K5730 Diverticulosis of large intestine without perforation or abscess without bleeding: Secondary | ICD-10-CM | POA: Diagnosis not present

## 2023-07-16 DIAGNOSIS — R188 Other ascites: Secondary | ICD-10-CM | POA: Diagnosis not present

## 2023-07-16 DIAGNOSIS — R109 Unspecified abdominal pain: Secondary | ICD-10-CM

## 2023-07-16 DIAGNOSIS — K529 Noninfective gastroenteritis and colitis, unspecified: Secondary | ICD-10-CM

## 2023-07-16 LAB — URINALYSIS, ROUTINE W REFLEX MICROSCOPIC
Bilirubin Urine: NEGATIVE
Glucose, UA: NEGATIVE mg/dL
Hgb urine dipstick: NEGATIVE
Ketones, ur: NEGATIVE mg/dL
Leukocytes,Ua: NEGATIVE
Nitrite: NEGATIVE
Specific Gravity, Urine: >1.046 — ABNORMAL HIGH (ref 1.005–1.030)
pH: 6 (ref 5.0–8.0)

## 2023-07-16 LAB — COMPREHENSIVE METABOLIC PANEL
ALT: 12 U/L (ref 0–44)
AST: 16 U/L (ref 15–41)
Albumin: 4.3 g/dL (ref 3.5–5.0)
Alkaline Phosphatase: 50 U/L (ref 38–126)
Anion gap: 10 (ref 5–15)
BUN: 22 mg/dL (ref 8–23)
CO2: 28 mmol/L (ref 22–32)
Calcium: 10.1 mg/dL (ref 8.9–10.3)
Chloride: 102 mmol/L (ref 98–111)
Creatinine, Ser: 1.13 mg/dL (ref 0.61–1.24)
GFR, Estimated: >60 mL/min (ref >60)
Glucose, Bld: 137 mg/dL — ABNORMAL HIGH (ref 70–99)
Potassium: 4 mmol/L (ref 3.5–5.1)
Sodium: 140 mmol/L (ref 135–145)
Total Bilirubin: 1.7 mg/dL — ABNORMAL HIGH (ref 0.3–1.2)
Total Protein: 7.2 g/dL (ref 6.5–8.1)

## 2023-07-16 LAB — CBC WITH DIFFERENTIAL/PLATELET
Abs Immature Granulocytes: 0.03 10*3/uL (ref 0.00–0.07)
Basophils Absolute: 0 10*3/uL (ref 0.0–0.1)
Basophils Relative: 0 %
Eosinophils Absolute: 0 10*3/uL (ref 0.0–0.5)
Eosinophils Relative: 1 %
HCT: 54.9 % — ABNORMAL HIGH (ref 39.0–52.0)
Hemoglobin: 18.9 g/dL — ABNORMAL HIGH (ref 13.0–17.0)
Immature Granulocytes: 0 %
Lymphocytes Relative: 12 %
Lymphs Abs: 1 10*3/uL (ref 0.7–4.0)
MCH: 29.3 pg (ref 26.0–34.0)
MCHC: 34.4 g/dL (ref 30.0–36.0)
MCV: 85.1 fL (ref 80.0–100.0)
Monocytes Absolute: 0.7 10*3/uL (ref 0.1–1.0)
Monocytes Relative: 8 %
Neutro Abs: 6.5 10*3/uL (ref 1.7–7.7)
Neutrophils Relative %: 79 %
Platelets: 214 10*3/uL (ref 150–400)
RBC: 6.45 MIL/uL — ABNORMAL HIGH (ref 4.22–5.81)
RDW: 13.3 % (ref 11.5–15.5)
WBC: 8.3 10*3/uL (ref 4.0–10.5)
nRBC: 0 % (ref 0.0–0.2)

## 2023-07-16 LAB — LIPASE, BLOOD: Lipase: 22 U/L (ref 11–51)

## 2023-07-16 MED ORDER — IOHEXOL 300 MG/ML  SOLN
100.0000 mL | Freq: Once | INTRAMUSCULAR | Status: AC | PRN
  Administered 2023-07-16: 100 mL via INTRAVENOUS

## 2023-07-16 MED ORDER — ONDANSETRON HCL 4 MG/2ML IJ SOLN
4.0000 mg | Freq: Once | INTRAMUSCULAR | Status: AC
  Administered 2023-07-16: 4 mg via INTRAVENOUS
  Filled 2023-07-16: qty 2

## 2023-07-16 MED ORDER — SODIUM CHLORIDE 0.9 % IV BOLUS
1000.0000 mL | Freq: Once | INTRAVENOUS | Status: AC
  Administered 2023-07-16: 1000 mL via INTRAVENOUS

## 2023-07-16 NOTE — ED Provider Notes (Signed)
[place] EMERGENCY DEPARTMENT AT [facility] Provider Note   CSN: [id number] Arrival date & time: 07/15/23  2310     History  Chief Complaint  Patient presents with   Constipation    Jose Hood is a 82 y.o. male.  Patient is an 82 year old male with history of hypertension, gout, shingles.  Patient presenting today with complaints of constipation.  He states that he has not had a bowel movement in over 1 week.  He states that he has tried multiple over-the-counter remedies without relief.  He reports generalized abdominal discomfort along with bloating.  No fevers or chills, but has felt nauseated.  The history is provided by the patient.       Home Medications Prior to Admission medications   Medication Sig Start Date End Date Taking? Authorizing Provider  amLODipine (NORVASC) 5 MG tablet Take 10 mg by mouth daily. 07/03/20   [provider]  aspirin 81 MG chewable tablet 1 tab    [provider]  finasteride (PROSCAR) 5 MG tablet TAKE 1 TABLET BY MOUTH EVERY DAY 06/25/18   Gordy Savers, MD  lisinopril (PRINIVIL,ZESTRIL) 40 MG tablet Take 1 tablet (40 mg total) by mouth daily. 06/19/18   Gordy Savers, MD  probenecid (BENEMID) 500 MG tablet Take 1 tablet (500 mg total) by mouth daily. 06/19/18   Gordy Savers, MD  propranolol (INDERAL) 80 MG tablet Take 1 tablet (80 mg total) by mouth daily. 01/28/22   Little Ishikawa, MD  tamsulosin (FLOMAX) 0.4 MG CAPS capsule TAKE ONE CAPSULE BY MOUTH EVERY DAY 06/25/18   Gordy Savers, MD      Allergies    Metronidazole    Review of Systems   Review of Systems  All other systems reviewed and are negative.   Physical Exam Updated Vital Signs BP (!) 160/96   Pulse 80   Temp 98.4 F (36.9 C) (Oral)   Resp 18   Wt 86.2 kg   SpO2 99%   BMI 25.07 kg/m  Physical Exam Vitals and nursing note reviewed.  Constitutional:      General: He is not in acute distress.     Appearance: He is well-developed. He is not diaphoretic.  HENT:     Head: Normocephalic and atraumatic.  Cardiovascular:     Rate and Rhythm: Normal rate and regular rhythm.     Heart sounds: No murmur heard.    No friction rub.  Pulmonary:     Effort: Pulmonary effort is normal. No respiratory distress.     Breath sounds: Normal breath sounds. No wheezing or rales.  Abdominal:     General: Bowel sounds are normal. There is no distension.     Palpations: Abdomen is soft.     Tenderness: There is abdominal tenderness. There is no guarding or rebound.  Musculoskeletal:        General: Normal range of motion.     Cervical back: Normal range of motion and neck supple.  Skin:    General: Skin is warm and dry.  Neurological:     Mental Status: He is alert and oriented to person, place, and time.     Coordination: Coordination normal.     ED Results / Procedures / Treatments   Labs (all labs ordered are listed, but only abnormal results are displayed) Labs Reviewed - No data to display  EKG None  Radiology No results found.  Procedures Procedures    Medications Ordered in ED Medications  sodium chloride 0.9 % bolus 1,000 mL (has no administration in time range)    ED Course/ Medical Decision Making/ A&P  Patient presenting with complaints of constipation as described in the HPI.  Patient arrives here with stable vital signs and is afebrile.  Physical examination reveals a mildly tender abdomen throughout, but no peritoneal signs.  Laboratory studies obtained including CBC, CMP, and lipase, all of which were unremarkable.  Urinalysis not consistent with infection.  CT scan of the abdomen and pelvis obtained showing mildly distended fluid-filled small bowel in the left lower quadrant with edema in the adjacent fat most likely enteritis, but could indicate early bowel obstruction.  Patient did have 1 episode of vomiting here in the ER, then was given Zofran.  He now seems  to be feeling better.  I am uncertain as to the etiology of his symptoms, but suspect some sort of enteritis.  He states he has not had a bowel movement in 1 week, but there is no evidence for increased stool burden or fecal impaction.  I feel as though patient can be discharged.  I will have him watch and wait and return if he develops worsening pain, bloody stools, or for other new and concerning symptoms.  Final Clinical Impression(s) / ED Diagnoses Final diagnoses:  None    Rx / DC Orders ED Discharge Orders     None         Geoffery Lyons, MD 07/16/23 [phone]

## 2023-07-16 NOTE — Discharge Instructions (Signed)
Continue medications as previously prescribed.  Return to the emergency department if you develop worsening pain, uncontrollable vomiting, bloody stools or vomit, high fevers, or for other new and concerning symptoms.

## 2023-07-21 DIAGNOSIS — K5900 Constipation, unspecified: Secondary | ICD-10-CM | POA: Diagnosis not present

## 2023-08-15 DIAGNOSIS — C44319 Basal cell carcinoma of skin of other parts of face: Secondary | ICD-10-CM | POA: Diagnosis not present

## 2023-09-01 DIAGNOSIS — K5900 Constipation, unspecified: Secondary | ICD-10-CM | POA: Diagnosis not present

## 2023-09-11 DIAGNOSIS — I10 Essential (primary) hypertension: Secondary | ICD-10-CM | POA: Diagnosis not present

## 2023-09-11 DIAGNOSIS — D72819 Decreased white blood cell count, unspecified: Secondary | ICD-10-CM | POA: Diagnosis not present

## 2023-09-11 DIAGNOSIS — K5900 Constipation, unspecified: Secondary | ICD-10-CM | POA: Diagnosis not present

## 2023-09-11 DIAGNOSIS — R3915 Urgency of urination: Secondary | ICD-10-CM | POA: Diagnosis not present

## 2023-09-11 DIAGNOSIS — I700 Atherosclerosis of aorta: Secondary | ICD-10-CM | POA: Diagnosis not present

## 2023-09-11 DIAGNOSIS — I77819 Aortic ectasia, unspecified site: Secondary | ICD-10-CM | POA: Diagnosis not present

## 2023-11-13 DIAGNOSIS — H40013 Open angle with borderline findings, low risk, bilateral: Secondary | ICD-10-CM | POA: Diagnosis not present

## 2023-11-13 DIAGNOSIS — Z961 Presence of intraocular lens: Secondary | ICD-10-CM | POA: Diagnosis not present

## 2024-01-02 DIAGNOSIS — N210 Calculus in bladder: Secondary | ICD-10-CM | POA: Diagnosis not present

## 2024-01-03 DIAGNOSIS — N210 Calculus in bladder: Secondary | ICD-10-CM | POA: Diagnosis not present

## 2024-01-03 DIAGNOSIS — R3915 Urgency of urination: Secondary | ICD-10-CM | POA: Diagnosis not present

## 2024-01-03 DIAGNOSIS — R310 Gross hematuria: Secondary | ICD-10-CM | POA: Diagnosis not present

## 2024-01-09 DIAGNOSIS — C44519 Basal cell carcinoma of skin of other part of trunk: Secondary | ICD-10-CM | POA: Diagnosis not present

## 2024-01-09 DIAGNOSIS — Z85828 Personal history of other malignant neoplasm of skin: Secondary | ICD-10-CM | POA: Diagnosis not present

## 2024-01-09 DIAGNOSIS — D225 Melanocytic nevi of trunk: Secondary | ICD-10-CM | POA: Diagnosis not present

## 2024-01-09 DIAGNOSIS — L812 Freckles: Secondary | ICD-10-CM | POA: Diagnosis not present

## 2024-01-09 DIAGNOSIS — L821 Other seborrheic keratosis: Secondary | ICD-10-CM | POA: Diagnosis not present

## 2024-01-24 NOTE — Progress Notes (Unsigned)
  Cardiology Office Note:  .   Date:  01/24/2024  ID:  Dietrich Pates, DOB May 18, 1941, MRN [id number] PCP: Farris Has, MD  [facility] Providers Cardiologist:  None { Click to update primary MD,subspecialty MD or APP then REFRESH:1}   History of Present Illness: .   No chief complaint on file.   Jose Hood is a 83 y.o. male with history of HTN, aortic dilation, AI who presents for follow-up.      Problem List Ascending aortic aneurysm  -43 mm 2021 -43 mm 2022 -45 mm 10/2022 2. Aortic regurgitation, mild 3. HTN 4. HLD -T chol 146, HDL 34, LDL 73, TG 238    ROS: All other ROS reviewed and negative. Pertinent positives noted in the HPI.     Studies Reviewed: Marland Kitchen       TTE 11/11/2022  1. Left ventricular ejection fraction, by estimation, is 60 to 65%. The  left ventricle has normal function. The left ventricle has no regional  wall motion abnormalities. Left ventricular diastolic parameters are  consistent with Grade I diastolic  dysfunction (impaired relaxation).   2. Right ventricular systolic function is normal. The right ventricular  size is normal. There is normal pulmonary artery systolic pressure.   3. The mitral valve is normal in structure. No evidence of mitral valve  regurgitation. No evidence of mitral stenosis.   4. The aortic valve is tricuspid. There is moderate calcification of the  aortic valve. There is moderate thickening of the aortic valve. Aortic  valve regurgitation is moderate. Aortic valve sclerosis is present, with  no evidence of aortic valve  stenosis. Aortic regurgitation PHT measures 572 msec. Aortic valve area,  by VTI measures 3.27 cm. Aortic valve mean gradient measures 9.0 mmHg.  Aortic valve Vmax measures 2.07 m/s.   5. Pulmonic valve regurgitation is moderate.   6. Aortic dilatation noted. There is mild dilatation of the aortic root,  measuring 43 mm. There is moderate dilatation of the ascending aorta,  measuring 45 mm.   7. The  inferior vena cava is normal in size with greater than 50%  respiratory variability, suggesting right atrial pressure of 3 mmHg.  Physical Exam:   VS:  There were no vitals taken for this visit.   Wt Readings from Last 3 Encounters:  07/16/23 165 lb (74.8 kg)  10/03/22 199 lb 6.4 oz (90.4 kg)  01/19/22 199 lb 6.4 oz (90.4 kg)    GEN: Well nourished, well developed in no acute distress NECK: No JVD; No carotid bruits CARDIAC: ***RRR, no murmurs, rubs, gallops RESPIRATORY:  Clear to auscultation without rales, wheezing or rhonchi  ABDOMEN: Soft, non-tender, non-distended EXTREMITIES:  No edema; No deformity  ASSESSMENT AND PLAN: .   ***    {Are you ordering a CV Procedure (e.g. stress test, cath, DCCV, TEE, etc)?   Press F2        :[id number]}   Follow-up: No follow-ups on file.  Time Spent with Patient: I have spent a total of *** minutes caring for this patient today face to face, ordering and reviewing labs/tests, reviewing prior records/medical history, examining the patient, establishing an assessment and plan, communicating results/findings to the patient/family, and documenting in the medical record.   Signed, Lenna Gilford. Flora Lipps, MD, [facility]  [facility]  [address] [place], [place] [address] [phone]  6:36 PM

## 2024-01-25 ENCOUNTER — Encounter: Payer: Self-pay | Admitting: Cardiovascular Disease

## 2024-01-25 ENCOUNTER — Telehealth: Payer: Self-pay | Admitting: Cardiovascular Disease

## 2024-01-25 ENCOUNTER — Ambulatory Visit: Payer: Medicare Other | Attending: Cardiovascular Disease | Admitting: Cardiovascular Disease

## 2024-01-25 VITALS — BP 110/60 | HR 98 | Ht 73.0 in | Wt 194.0 lb

## 2024-01-25 DIAGNOSIS — E785 Hyperlipidemia, unspecified: Secondary | ICD-10-CM | POA: Diagnosis not present

## 2024-01-25 DIAGNOSIS — I4819 Other persistent atrial fibrillation: Secondary | ICD-10-CM

## 2024-01-25 DIAGNOSIS — I77819 Aortic ectasia, unspecified site: Secondary | ICD-10-CM

## 2024-01-25 DIAGNOSIS — I10 Essential (primary) hypertension: Secondary | ICD-10-CM

## 2024-01-25 DIAGNOSIS — I351 Nonrheumatic aortic (valve) insufficiency: Secondary | ICD-10-CM

## 2024-01-25 DIAGNOSIS — R7303 Prediabetes: Secondary | ICD-10-CM | POA: Diagnosis not present

## 2024-01-25 MED ORDER — METOPROLOL TARTRATE 25 MG PO TABS: 25.0000 mg | ORAL_TABLET | Freq: Two times a day (BID) | ORAL | 3 refills | Status: DC

## 2024-01-25 MED ORDER — APIXABAN 5 MG PO TABS: 5.0000 mg | ORAL_TABLET | Freq: Two times a day (BID) | ORAL | 0 refills | Status: DC

## 2024-01-25 MED ORDER — APIXABAN 5 MG PO TABS: 5.0000 mg | ORAL_TABLET | Freq: Two times a day (BID) | ORAL | 3 refills | Status: DC

## 2024-01-25 NOTE — Telephone Encounter (Signed)
 Prescription refill request for Eliquis received. Indication: Afib  Last office visit: 01/25/24 ([place])  Scr: 1.13 (07/16/23) Age: 83 Weight: 88kg  Had updated labs drawn today - results pending.  Refill sent to correct pharmacy.

## 2024-01-25 NOTE — Patient Instructions (Signed)
 Medication Instructions:  - STOP ASPIRIN  - STOP PROPANOLOL  - START Eliquis 5 MG BY MOUTH TWICE DAILY  - START METOPROLOL TARTRATE 25 MG BY MOUTH TWICE DAILY    *If you need a refill on your cardiac medications before your next appointment, please call your pharmacy*   Lab Work: TSH CBC  LIPIDS  CMET  Hemoglobin A1C       If you have labs (blood work) drawn today and your tests are completely normal, you will receive your results only by: MyChart Message (if you have MyChart) OR A paper copy in the mail If you have any lab test that is abnormal or we need to change your treatment, we will call you to review the results.   Testing/Procedures: Echo will be scheduled at [address].  Your physician has requested that you have an echocardiogram. Echocardiography is a painless test that uses sound waves to create images of your heart. It provides your doctor with information about the size and shape of your heart and how well your heart's chambers and valves are working. This procedure takes approximately one hour. There are no restrictions for this procedure. Please do NOT wear cologne, perfume, aftershave, or lotions (deodorant is allowed). Please arrive 15 minutes prior to your appointment time.    Follow-Up: At [facility], you and your health needs are our priority.  As part of our continuing mission to provide you with exceptional heart care, we have created designated Provider Care Teams.  These Care Teams include your primary Cardiologist (physician) and Advanced Practice Providers (APPs -  Physician Assistants and Nurse Practitioners) who all work together to provide you with the care you need, when you need it.  We recommend signing up for the patient portal called "MyChart".  Sign up information is provided on this After Visit Summary.  MyChart is used to connect with patients for Virtual Visits (Telemedicine).  Patients are able to view lab/test  results, encounter notes, upcoming appointments, etc.  Non-urgent messages can be sent to your provider as well.   To learn more about what you can do with MyChart, go to [email or web address].    Your next appointment:   6 month(s)  The format for your next appointment:   In Person  Provider:   Lennie Odor, MD     Other Instructions

## 2024-01-25 NOTE — Telephone Encounter (Signed)
 Follow Up:     Pt c/o medication issue:  1. Name of Medication: Metoprolol and Eliquis  2. How are you currently taking this medication (dosage and times per day)?   3. Are you having a reaction (difficulty breathing--STAT)?   4. What is your medication issue? Patient said these 2 medicine was called to the wrong pharmacy-[facility]- I also see where  it was sent to [facility]  , please cancel [facility]

## 2024-01-26 LAB — CBC
Hematocrit: 53.9 % — ABNORMAL HIGH (ref 37.5–51.0)
Hemoglobin: 17.7 g/dL (ref 13.0–17.7)
MCH: 29.5 pg (ref 26.6–33.0)
MCHC: 32.8 g/dL (ref 31.5–35.7)
MCV: 90 fL (ref 79–97)
Platelets: 211 10*3/uL (ref 150–450)
RBC: 6.01 x10E6/uL — ABNORMAL HIGH (ref 4.14–5.80)
RDW: 14 % (ref 11.6–15.4)
WBC: 6.4 10*3/uL (ref 3.4–10.8)

## 2024-01-26 LAB — COMPREHENSIVE METABOLIC PANEL WITH GFR
ALT: 18 IU/L (ref 0–44)
AST: 17 IU/L (ref 0–40)
Albumin: 3.9 g/dL (ref 3.7–4.7)
Alkaline Phosphatase: 52 IU/L (ref 44–121)
BUN/Creatinine Ratio: 23 (ref 10–24)
BUN: 29 mg/dL — ABNORMAL HIGH (ref 8–27)
Bilirubin Total: 1.4 mg/dL — ABNORMAL HIGH (ref 0.0–1.2)
CO2: 23 mmol/L (ref 20–29)
Calcium: 9.7 mg/dL (ref 8.6–10.2)
Chloride: 100 mmol/L (ref 96–106)
Creatinine, Ser: 1.26 mg/dL (ref 0.76–1.27)
Globulin, Total: 2.4 g/dL (ref 1.5–4.5)
Glucose: 49 mg/dL — ABNORMAL LOW (ref 70–99)
Potassium: 4.3 mmol/L (ref 3.5–5.2)
Sodium: 140 mmol/L (ref 134–144)
Total Protein: 6.3 g/dL (ref 6.0–8.5)
eGFR: 57 mL/min/1.73 — ABNORMAL LOW

## 2024-01-26 LAB — LIPID PANEL
Chol/HDL Ratio: 4.9 ratio (ref 0.0–5.0)
Cholesterol, Total: 206 mg/dL — ABNORMAL HIGH (ref 100–199)
HDL: 42 mg/dL (ref >39)
LDL Chol Calc (NIH): 127 mg/dL — ABNORMAL HIGH (ref 0–99)
Triglycerides: 206 mg/dL — ABNORMAL HIGH (ref 0–149)
VLDL Cholesterol Cal: 37 mg/dL (ref 5–40)

## 2024-01-26 LAB — TSH: TSH: 1.52 u[IU]/mL (ref 0.450–4.500)

## 2024-01-26 LAB — HEMOGLOBIN A1C
Est. average glucose Bld gHb Est-mCnc: 128 mg/dL
Hgb A1c MFr Bld: 6.1 % — ABNORMAL HIGH (ref 4.8–5.6)

## 2024-01-30 ENCOUNTER — Encounter: Payer: Self-pay | Admitting: Cardiovascular Disease

## 2024-02-12 ENCOUNTER — Emergency Department (HOSPITAL_COMMUNITY)

## 2024-02-12 ENCOUNTER — Other Ambulatory Visit: Payer: Self-pay

## 2024-02-12 ENCOUNTER — Inpatient Hospital Stay (HOSPITAL_COMMUNITY)
Admission: EM | Admit: 2024-02-12 | Discharge: 2024-02-21 | DRG: 418 | Disposition: A | Discharge status: Home Health | Attending: Internal Medicine | Admitting: Internal Medicine

## 2024-02-12 DIAGNOSIS — R000 Tachycardia, unspecified: Secondary | ICD-10-CM | POA: Diagnosis not present

## 2024-02-12 DIAGNOSIS — I429 Cardiomyopathy, unspecified: Secondary | ICD-10-CM | POA: Diagnosis present

## 2024-02-12 DIAGNOSIS — K838 Other specified diseases of biliary tract: Secondary | ICD-10-CM | POA: Diagnosis not present

## 2024-02-12 DIAGNOSIS — I480 Paroxysmal atrial fibrillation: Secondary | ICD-10-CM | POA: Insufficient documentation

## 2024-02-12 DIAGNOSIS — I5020 Unspecified systolic (congestive) heart failure: Secondary | ICD-10-CM | POA: Diagnosis not present

## 2024-02-12 DIAGNOSIS — I083 Combined rheumatic disorders of mitral, aortic and tricuspid valves: Secondary | ICD-10-CM | POA: Diagnosis present

## 2024-02-12 DIAGNOSIS — Z7901 Long term (current) use of anticoagulants: Secondary | ICD-10-CM

## 2024-02-12 DIAGNOSIS — Z5181 Encounter for therapeutic drug level monitoring: Secondary | ICD-10-CM | POA: Diagnosis not present

## 2024-02-12 DIAGNOSIS — R350 Frequency of micturition: Secondary | ICD-10-CM | POA: Diagnosis present

## 2024-02-12 DIAGNOSIS — Z01810 Encounter for preprocedural cardiovascular examination: Secondary | ICD-10-CM | POA: Diagnosis not present

## 2024-02-12 DIAGNOSIS — I361 Nonrheumatic tricuspid (valve) insufficiency: Secondary | ICD-10-CM | POA: Diagnosis not present

## 2024-02-12 DIAGNOSIS — M109 Gout, unspecified: Secondary | ICD-10-CM | POA: Diagnosis not present

## 2024-02-12 DIAGNOSIS — K5900 Constipation, unspecified: Secondary | ICD-10-CM | POA: Diagnosis not present

## 2024-02-12 DIAGNOSIS — R109 Unspecified abdominal pain: Secondary | ICD-10-CM | POA: Diagnosis not present

## 2024-02-12 DIAGNOSIS — R1011 Right upper quadrant pain: Secondary | ICD-10-CM | POA: Diagnosis not present

## 2024-02-12 DIAGNOSIS — E785 Hyperlipidemia, unspecified: Secondary | ICD-10-CM | POA: Diagnosis not present

## 2024-02-12 DIAGNOSIS — N401 Enlarged prostate with lower urinary tract symptoms: Secondary | ICD-10-CM | POA: Diagnosis present

## 2024-02-12 DIAGNOSIS — I7121 Aneurysm of the ascending aorta, without rupture: Secondary | ICD-10-CM | POA: Diagnosis not present

## 2024-02-12 DIAGNOSIS — R0689 Other abnormalities of breathing: Secondary | ICD-10-CM | POA: Diagnosis not present

## 2024-02-12 DIAGNOSIS — R1030 Lower abdominal pain, unspecified: Secondary | ICD-10-CM | POA: Diagnosis not present

## 2024-02-12 DIAGNOSIS — I10 Essential (primary) hypertension: Secondary | ICD-10-CM | POA: Diagnosis not present

## 2024-02-12 DIAGNOSIS — I110 Hypertensive heart disease with heart failure: Secondary | ICD-10-CM | POA: Diagnosis present

## 2024-02-12 DIAGNOSIS — K810 Acute cholecystitis: Principal | ICD-10-CM

## 2024-02-12 DIAGNOSIS — I4891 Unspecified atrial fibrillation: Secondary | ICD-10-CM | POA: Diagnosis not present

## 2024-02-12 DIAGNOSIS — I340 Nonrheumatic mitral (valve) insufficiency: Secondary | ICD-10-CM | POA: Diagnosis not present

## 2024-02-12 DIAGNOSIS — I4819 Other persistent atrial fibrillation: Secondary | ICD-10-CM | POA: Diagnosis present

## 2024-02-12 DIAGNOSIS — I482 Chronic atrial fibrillation, unspecified: Secondary | ICD-10-CM | POA: Diagnosis not present

## 2024-02-12 DIAGNOSIS — K812 Acute cholecystitis with chronic cholecystitis: Secondary | ICD-10-CM | POA: Diagnosis not present

## 2024-02-12 DIAGNOSIS — R112 Nausea with vomiting, unspecified: Secondary | ICD-10-CM | POA: Diagnosis not present

## 2024-02-12 DIAGNOSIS — N281 Cyst of kidney, acquired: Secondary | ICD-10-CM | POA: Diagnosis not present

## 2024-02-12 DIAGNOSIS — I351 Nonrheumatic aortic (valve) insufficiency: Secondary | ICD-10-CM | POA: Diagnosis not present

## 2024-02-12 DIAGNOSIS — I499 Cardiac arrhythmia, unspecified: Secondary | ICD-10-CM | POA: Diagnosis not present

## 2024-02-12 DIAGNOSIS — Z8249 Family history of ischemic heart disease and other diseases of the circulatory system: Secondary | ICD-10-CM

## 2024-02-12 DIAGNOSIS — I2510 Atherosclerotic heart disease of native coronary artery without angina pectoris: Secondary | ICD-10-CM | POA: Diagnosis not present

## 2024-02-12 DIAGNOSIS — I4811 Longstanding persistent atrial fibrillation: Secondary | ICD-10-CM

## 2024-02-12 DIAGNOSIS — Z79899 Other long term (current) drug therapy: Secondary | ICD-10-CM

## 2024-02-12 HISTORY — DX: Unspecified atrial fibrillation: I48.91

## 2024-02-12 LAB — URINALYSIS, W/ REFLEX TO CULTURE (INFECTION SUSPECTED)
Bilirubin Urine: NEGATIVE
Glucose, UA: NEGATIVE mg/dL
Hgb urine dipstick: NEGATIVE
Ketones, ur: NEGATIVE mg/dL
Leukocytes,Ua: NEGATIVE
Nitrite: NEGATIVE
Protein, ur: NEGATIVE mg/dL
Specific Gravity, Urine: 1.02 (ref 1.005–1.030)
pH: 5 (ref 5.0–8.0)

## 2024-02-12 LAB — COMPREHENSIVE METABOLIC PANEL WITH GFR
ALT: 12 U/L (ref 0–44)
AST: 20 U/L (ref 15–41)
Albumin: 3.1 g/dL — ABNORMAL LOW (ref 3.5–5.0)
Alkaline Phosphatase: 47 U/L (ref 38–126)
Anion gap: 12 (ref 5–15)
BUN: 22 mg/dL (ref 8–23)
CO2: 24 mmol/L (ref 22–32)
Calcium: 8.9 mg/dL (ref 8.9–10.3)
Chloride: 102 mmol/L (ref 98–111)
Creatinine, Ser: 1.23 mg/dL (ref 0.61–1.24)
GFR, Estimated: 58 mL/min — ABNORMAL LOW (ref >60)
Glucose, Bld: 162 mg/dL — ABNORMAL HIGH (ref 70–99)
Potassium: 3.9 mmol/L (ref 3.5–5.1)
Sodium: 138 mmol/L (ref 135–145)
Total Bilirubin: 2.4 mg/dL — ABNORMAL HIGH (ref 0.0–1.2)
Total Protein: 6.2 g/dL — ABNORMAL LOW (ref 6.5–8.1)

## 2024-02-12 LAB — CBC WITH DIFFERENTIAL/PLATELET
Abs Immature Granulocytes: 0.03 10*3/uL (ref 0.00–0.07)
Basophils Absolute: 0 10*3/uL (ref 0.0–0.1)
Basophils Relative: 0 %
Eosinophils Absolute: 0 10*3/uL (ref 0.0–0.5)
Eosinophils Relative: 0 %
HCT: 50.4 % (ref 39.0–52.0)
Hemoglobin: 16.9 g/dL (ref 13.0–17.0)
Immature Granulocytes: 0 %
Lymphocytes Relative: 10 %
Lymphs Abs: 0.9 10*3/uL (ref 0.7–4.0)
MCH: 30.2 pg (ref 26.0–34.0)
MCHC: 33.5 g/dL (ref 30.0–36.0)
MCV: 90 fL (ref 80.0–100.0)
Monocytes Absolute: 1.1 10*3/uL — ABNORMAL HIGH (ref 0.1–1.0)
Monocytes Relative: 13 %
Neutro Abs: 6.8 10*3/uL (ref 1.7–7.7)
Neutrophils Relative %: 77 %
Platelets: 174 10*3/uL (ref 150–400)
RBC: 5.6 MIL/uL (ref 4.22–5.81)
RDW: 14.2 % (ref 11.5–15.5)
WBC: 8.9 10*3/uL (ref 4.0–10.5)
nRBC: 0 % (ref 0.0–0.2)

## 2024-02-12 LAB — BRAIN NATRIURETIC PEPTIDE: B Natriuretic Peptide: 96.9 pg/mL (ref 0.0–100.0)

## 2024-02-12 LAB — TROPONIN I (HIGH SENSITIVITY): Troponin I (High Sensitivity): 10 ng/L (ref <18)

## 2024-02-12 MED ORDER — LACTATED RINGERS IV BOLUS
1000.0000 mL | Freq: Once | INTRAVENOUS | Status: AC
  Administered 2024-02-12: 1000 mL via INTRAVENOUS

## 2024-02-12 MED ORDER — DILTIAZEM HCL 25 MG/5ML IV SOLN
15.0000 mg | Freq: Once | INTRAVENOUS | Status: AC
  Administered 2024-02-12: 15 mg via INTRAVENOUS
  Filled 2024-02-12: qty 5

## 2024-02-12 MED ORDER — PIPERACILLIN-TAZOBACTAM 3.375 G IVPB
3.3750 g | Freq: Three times a day (TID) | INTRAVENOUS | Status: DC
  Administered 2024-02-13 – 2024-02-16 (×11): 3.375 g via INTRAVENOUS
  Filled 2024-02-12 (×10): qty 50

## 2024-02-12 MED ORDER — IOHEXOL 350 MG/ML SOLN
75.0000 mL | Freq: Once | INTRAVENOUS | Status: AC | PRN
  Administered 2024-02-12: 75 mL via INTRAVENOUS

## 2024-02-12 MED ORDER — DILTIAZEM HCL-DEXTROSE 125-5 MG/125ML-% IV SOLN (PREMIX)
5.0000 mg/h | INTRAVENOUS | Status: DC
  Administered 2024-02-13 (×2): 15 mg/h via INTRAVENOUS
  Administered 2024-02-13: 5 mg/h via INTRAVENOUS
  Administered 2024-02-14 (×3): 15 mg/h via INTRAVENOUS
  Administered 2024-02-15: 10 mg/h via INTRAVENOUS
  Administered 2024-02-15: 15 mg/h via INTRAVENOUS
  Administered 2024-02-16 – 2024-02-17 (×3): 10 mg/h via INTRAVENOUS
  Filled 2024-02-12 (×13): qty 125

## 2024-02-12 MED ORDER — MAGNESIUM SULFATE IN D5W 1-5 GM/100ML-% IV SOLN
1.0000 g | Freq: Once | INTRAVENOUS | Status: AC
  Administered 2024-02-13: 1 g via INTRAVENOUS
  Filled 2024-02-12: qty 100

## 2024-02-12 MED ORDER — PIPERACILLIN-TAZOBACTAM 3.375 G IVPB 30 MIN
3.3750 g | Freq: Once | INTRAVENOUS | Status: AC
  Administered 2024-02-12: 3.375 g via INTRAVENOUS
  Filled 2024-02-12: qty 50

## 2024-02-12 NOTE — ED Provider Notes (Signed)
 Jose Hood EMERGENCY DEPARTMENT AT [facility] Provider Note  CSN: [id number] Arrival date & time: 02/12/24 2118  Chief Complaint(s) Abdominal Pain  HPI Jose Hood is a 83 y.o. male who is here today for right upper quadrant pain, as well as unusual sensation when he urinates.  Patient says that this been ongoing for the last couple of days, however seem to get worse today.  Patient has a history of atrial fibrillation, is on Eliquis .  When EMS arrived, patient was tachycardic to the 150s, they gave diltiazem.   Past Medical History Past Medical History:  Diagnosis Date   Gout    Hypertension    Shingles 07/20/2016   Patient Active Problem List   Diagnosis Date Noted   COLITIS 09/28/2010   OTHER ACUTE OTITIS EXTERNA 10/23/2007   Diverticulosis of colon 09/21/2007   HYPERCHOLESTEROLEMIA, PURE 03/26/2007   Dyslipidemia 03/26/2007   GOUT 03/26/2007   Essential hypertension 03/26/2007   Osteoarthritis 03/26/2007   History of colonic polyps 03/26/2007   Home Medication(s) Prior to Admission medications   Medication Sig Start Date End Date Taking? Authorizing Provider  amLODipine (NORVASC) 5 MG tablet Take 10 mg by mouth daily. 07/03/20   [provider]  apixaban  (ELIQUIS ) 5 MG TABS tablet Take 1 tablet (5 mg total) by mouth 2 (two) times daily. 01/25/24   Harrold Lincoln, MD  finasteride  (PROSCAR ) 5 MG tablet TAKE 1 TABLET BY MOUTH EVERY DAY 06/25/18   Hillery Lown, MD  lisinopril  (PRINIVIL ,ZESTRIL ) 40 MG tablet Take 1 tablet (40 mg total) by mouth daily. 06/19/18   Hillery Lown, MD  metoprolol  tartrate (LOPRESSOR ) 25 MG tablet Take 1 tablet (25 mg total) by mouth 2 (two) times daily. 01/25/24 04/24/24  O'NealCathay Clonts, MD  probenecid  (BENEMID ) 500 MG tablet Take 1 tablet (500 mg total) by mouth daily. 06/19/18   Hillery Lown, MD  tamsulosin  (FLOMAX ) 0.4 MG CAPS capsule TAKE ONE CAPSULE BY MOUTH EVERY DAY 06/25/18   Hillery Lown,  MD                                                                                                                                    Past Surgical History Past Surgical History:  Procedure Laterality Date   HERNIA REPAIR     NO PAST SURGERIES     Family History Family History  Problem Relation Age of Onset   Heart disease Father    Colon cancer Neg Hx     Social History Social History   Tobacco Use   Smoking status: Never   Smokeless tobacco: Never  Substance Use Topics   Alcohol use: No   Drug use: No   Allergies Metronidazole  Review of Systems Review of Systems  Physical Exam Vital Signs  I have reviewed the triage vital signs BP (!) 141/92   Pulse (!) 104   Temp 98.4 F (36.9  C)   Resp 16   Ht 6\' 1"  (1.854 m)   Wt 86.2 kg   SpO2 100%   BMI 25.07 kg/m   Physical Exam Vitals reviewed.  HENT:     Head: Normocephalic.  Pulmonary:     Effort: Pulmonary effort is normal.  Abdominal:     General: Abdomen is flat.     Palpations: Abdomen is soft.     Tenderness: There is abdominal tenderness in the right upper quadrant and epigastric area.  Skin:    General: Skin is warm.  Neurological:     General: No focal deficit present.     Mental Status: He is alert.     ED Results and Treatments Labs (all labs ordered are listed, but only abnormal results are displayed) Labs Reviewed  COMPREHENSIVE METABOLIC PANEL WITH GFR - Abnormal; Notable for the following components:      Result Value   Glucose, Bld 162 (*)    Total Protein 6.2 (*)    Albumin 3.1 (*)    Total Bilirubin 2.4 (*)    GFR, Estimated 58 (*)    All other components within normal limits  CBC WITH DIFFERENTIAL/PLATELET - Abnormal; Notable for the following components:   Monocytes Absolute 1.1 (*)    All other components within normal limits  URINALYSIS, W/ REFLEX TO CULTURE (INFECTION SUSPECTED) - Abnormal; Notable for the following components:   Color, Urine AMBER (*)    Bacteria, UA  RARE (*)    All other components within normal limits  BRAIN NATRIURETIC PEPTIDE  TROPONIN I (HIGH SENSITIVITY)                                                                                                                          Radiology US  Abdomen Limited RUQ (LIVER/GB) Result Date: 02/12/2024 CLINICAL DATA:  Abdominal pain EXAM: ULTRASOUND ABDOMEN LIMITED RIGHT UPPER QUADRANT COMPARISON:  CT chest 02/12/2024 and CT abdomen pelvis 07/16/2023 FINDINGS: Gallbladder: Sludge is present within the gallbladder. There is marked wall thickening measuring 10 mm in thickness. A positive sonographic Abigail Abler sign was noted by the sonographer. Common bile duct: Diameter: Not visualized. Liver: No focal lesion identified. Within normal limits in parenchymal echogenicity. Portal vein is patent on color Doppler imaging with normal direction of blood flow towards the liver. Other: None. IMPRESSION: Findings consistent with acute cholecystitis. Electronically Signed   By: Rozell Cornet M.D.   On: 02/12/2024 23:32   CT Angio Chest Aorta w/CM &/OR wo/CM Result Date: 02/12/2024 CLINICAL DATA:  Acute aortic syndrome suspected.  Abdominal pain. EXAM: CT ANGIOGRAPHY CHEST WITH CONTRAST TECHNIQUE: Multidetector CT imaging of the chest was performed using the standard protocol during bolus administration of intravenous contrast. Multiplanar CT image reconstructions and MIPs were obtained to evaluate the vascular anatomy. RADIATION DOSE REDUCTION: This exam was performed according to the departmental dose-optimization program which includes automated exposure control, adjustment of the mA and/or kV according to patient size and/or use of iterative  reconstruction technique. CONTRAST:  75mL OMNIPAQUE  IOHEXOL  350 MG/ML SOLN COMPARISON:  09/27/2021 FINDINGS: Cardiovascular: Images are obtained during the pulmonary arterial phase after contrast material administration. There is good opacification of the central and segmental  pulmonary arteries. No focal filling defects. No evidence of significant pulmonary embolus. Poor opacification of the aorta during this phase limits evaluation of the aorta 4 dissection or other intrinsic aortic abnormality. There is a ascending thoracic aortic aneurysm measuring 4.7 cm in diameter. Calcification of the aorta and coronary arteries. Normal heart size. No pericardial effusions. Mediastinum/Nodes: No enlarged mediastinal, hilar, or axillary lymph nodes. Thyroid  gland, trachea, and esophagus demonstrate no significant findings. Lungs/Pleura: Motion artifact limits examination. There is evidence of infiltration or atelectasis in both lung bases. No pleural effusion or pneumothorax. Upper Abdomen: Poor definition of the gallbladder wall suggest pericholecystic edema, possibly due to cholecystitis. No stones are demonstrated. Bilateral renal cysts are partially visualized. These are better demonstrated on prior CT abdomen and pelvis from 07/16/2023. No imaging follow-up is indicated. Musculoskeletal: Degenerative changes in the spine. No acute bony abnormalities. Review of the MIP images confirms the above findings. IMPRESSION: 1. Images were obtained during the pulmonary arterial phase. No evidence of significant pulmonary embolus. 2. Ascending aortic aneurysm measuring 4.7 cm diameter. Ascending thoracic aortic aneurysm. Recommend semi-annual imaging followup by CTA or MRA and referral to cardiothoracic surgery if not already obtained. This recommendation follows 2010 ACCF/AHA/AATS/ACR/ASA/SCA/SCAI/SIR/STS/SVM Guidelines for the Diagnosis and Management of Patients With Thoracic Aortic Disease. Circulation. 2010; 121: Z610-R604. Aortic aneurysm NOS (ICD10-I71.9) 3. Infiltration or atelectasis in both lung bases. 4. Poorly defined gallbladder wall suggesting pericholecystic edema. This could indicate cholecystitis in the appropriate clinical setting. Consider ultrasound for further evaluation. 5. Aortic  atherosclerosis. Electronically Signed   By: Boyce Byes M.D.   On: 02/12/2024 23:06    Pertinent labs & imaging results that were available during my care of the patient were reviewed by me and considered in my medical decision making (see MDM for details).  Medications Ordered in ED Medications  piperacillin-tazobactam (ZOSYN) IVPB 3.375 g (3.375 g Intravenous New Bag/Given 02/12/24 2343)  magnesium sulfate IVPB 1 g 100 mL (has no administration in time range)  diltiazem (CARDIZEM) 125 mg in dextrose 5% 125 mL (1 mg/mL) infusion (has no administration in time range)  lactated ringers bolus 1,000 mL (0 mLs Intravenous Stopped 02/12/24 2306)  diltiazem (CARDIZEM) injection 15 mg (15 mg Intravenous Given 02/12/24 2207)  iohexol  (OMNIPAQUE ) 350 MG/ML injection 75 mL (75 mLs Intravenous Contrast Given 02/12/24 2256)  lactated ringers bolus 1,000 mL (1,000 mLs Intravenous New Bag/Given 02/12/24 2342)                                                                                                                                     Procedures .Critical Care  Performed by: Nathanael Baker, DO Authorized by: Nathanael Baker, DO   Critical care  provider statement:    Critical care time (minutes):  30   Critical care was necessary to treat or prevent imminent or life-threatening deterioration of the following conditions:  Cardiac failure (Atrial fibrillation with RVR, acute cholecystitis)   Critical care was time spent personally by me on the following activities:  Development of treatment plan with patient or surrogate, discussions with consultants, evaluation of patient's response to treatment, examination of patient, ordering and review of laboratory studies, ordering and review of radiographic studies, ordering and performing treatments and interventions, pulse oximetry, re-evaluation of patient's condition and review of old charts   (including critical care time)  Medical Decision Making  / ED Course   This patient presents to the ED for concern of abdominal pain, this involves an extensive number of treatment options, and is a complaint that carries with it a high risk of complications and morbidity.  The differential diagnosis includes cholecystitis, cholelithiasis, aortic pathology, pneumonia, atrial fibrillation.  MDM: I think that the patient's elevated heart rate is secondary to other etiology.  Will obtain imaging of the patient's chest abdomen pelvis.  Blood work ordered.  Reassessment 11:40 PM-patient with an acute cholecystitis.  Will start him on Zosyn.  Heart rate remains elevated.  Will continue provide patient fluids, will give some magnesium.  Will place patient on diltiazem drip to prevent continued sustained elevated heart rate, however I do not believe aggressive rate control is the answer for this patient has this is clearly compensatory for his acute cholecystitis.  Spoke with Dr. Dorrie Gaudier of surgery who agreed with medical admission.  Will hold patient's Eliquis .     Additional history obtained: -Additional history obtained from patient's wife -External records from outside source obtained and reviewed including: Chart review including previous notes, labs, imaging, consultation notes   Lab Tests: -I ordered, reviewed, and interpreted labs.   The pertinent results include:   Labs Reviewed  COMPREHENSIVE METABOLIC PANEL WITH GFR - Abnormal; Notable for the following components:      Result Value   Glucose, Bld 162 (*)    Total Protein 6.2 (*)    Albumin 3.1 (*)    Total Bilirubin 2.4 (*)    GFR, Estimated 58 (*)    All other components within normal limits  CBC WITH DIFFERENTIAL/PLATELET - Abnormal; Notable for the following components:   Monocytes Absolute 1.1 (*)    All other components within normal limits  URINALYSIS, W/ REFLEX TO CULTURE (INFECTION SUSPECTED) - Abnormal; Notable for the following components:   Color, Urine AMBER (*)     Bacteria, UA RARE (*)    All other components within normal limits  BRAIN NATRIURETIC PEPTIDE  TROPONIN I (HIGH SENSITIVITY)      EKG atrial fibrillation  EKG Interpretation Date/Time:  Monday February 12 2024 21:31:25 EDT Ventricular Rate:  137 PR Interval:    QRS Duration:  89 QT Interval:  261 QTC Calculation: 394 R Axis:   86  Text Interpretation: Atrial fibrillation with rapid V-rate Borderline right axis deviation Minimal ST depression, inferior leads Borderline T abnormalities, inferior leads Confirmed by Afton Horse [phone]) on 02/12/2024 11:43:01 PM         Imaging Studies ordered: I ordered imaging studies including CT of the abdomen, right upper quadrant ultrasound I independently visualized and interpreted imaging. I agree with the radiologist interpretation   Medicines ordered and prescription drug management: Meds ordered this encounter  Medications   lactated ringers bolus 1,000 mL   diltiazem (CARDIZEM) injection 15  mg   iohexol  (OMNIPAQUE ) 350 MG/ML injection 75 mL   lactated ringers bolus 1,000 mL   piperacillin-tazobactam (ZOSYN) IVPB 3.375 g    Antibiotic Indication::   Intra-abdominal Infection   magnesium sulfate IVPB 1 g 100 mL   diltiazem (CARDIZEM) 125 mg in dextrose 5% 125 mL (1 mg/mL) infusion    -I have reviewed the patients home medicines and have made adjustments as needed  Critical interventions Management atrial fibrillation with RVR, acute cholecystitis  Consultations Obtained: I requested consultation with the general surgery,  and discussed lab and imaging findings as well as pertinent plan - they recommend: Medical admission   Cardiac Monitoring: The patient was maintained on a cardiac monitor.  I personally viewed and interpreted the cardiac monitored which showed an underlying rhythm of: Atrial fibrillation  Social Determinants of Health:  Factors impacting patients care include: Atrial fibrillation, anticoagulated  status   Reevaluation: After the interventions noted above, I reevaluated the patient and found that they have :improved  Co morbidities that complicate the patient evaluation  Past Medical History:  Diagnosis Date   Gout    Hypertension    Shingles 07/20/2016      Dispostion: Admission    Final Clinical Impression(s) / ED Diagnoses Final diagnoses:  Acute cholecystitis  Longstanding persistent atrial fibrillation (HCC)     @PCDICTATION @    Afton Horse T, DO 02/12/24 2345

## 2024-02-12 NOTE — ED Triage Notes (Signed)
 Pt reporting RLQ abdominal pain. Reports vomiting x2 days ago. Pt also in Afib RVR per EMS. Pt A&Ox4.

## 2024-02-12 NOTE — ED Provider Notes (Signed)
  Provider Note MRN:  [id number]  Arrival date & time: 02/13/24    ED Course and Medical Decision Making  Assumed care of patient at sign-out or upon transfer.  Acute cholecystitis, also in A-fib with mild RVR on Dilt drip.  Dr. Dorrie Gaudier of general surgery will take patient to the OR tomorrow, requesting hospitalist admission.  Procedures  Final Clinical Impressions(s) / ED Diagnoses     ICD-10-CM   1. Acute cholecystitis  K81.0     2. Longstanding persistent atrial fibrillation [facility])  I48.11       ED Discharge Orders     None       Discharge Instructions   None     Merrick Abe. Harless Lien, MD [facility] [email or web address]    Edson Graces, MD 02/13/24 [phone]

## 2024-02-12 NOTE — Consult Note (Signed)
 Reason for Consult:abdominal pain Referring Provider: [name] is an 83 y.o. male.  HPI: 83 yo male with 2 days of abdominal pain. Pain is right sided. It comes and goes. Before yesterday he has never had similar pains. He has been nauseated and vomiting as well.  He takes eliquis  for a fib and last dose was yesterday evening.  Past Medical History:  Diagnosis Date   Gout    Hypertension    Shingles 07/20/2016    Past Surgical History:  Procedure Laterality Date   HERNIA REPAIR     NO PAST SURGERIES      Family History  Problem Relation Age of Onset   Heart disease Father    Colon cancer Neg Hx     Social History:  reports that he has never smoked. He has never used smokeless tobacco. He reports that he does not drink alcohol and does not use drugs.  Allergies:  Allergies  Allergen Reactions   Metronidazole Other (See Comments)    Pt denies allergy    Medications: I have reviewed the patient's current medications.  Results for orders placed or performed during the hospital encounter of 02/12/24 (from the past 48 hours)  Brain natriuretic peptide     Status: None   Collection Time: 02/12/24  9:27 PM  Result Value Ref Range   B Natriuretic Peptide 96.9 0.0 - 100.0 pg/mL    Comment: Performed at [facility], [address], [place], [place] [address]  Comprehensive metabolic panel     Status: Abnormal   Collection Time: 02/12/24  9:28 PM  Result Value Ref Range   Sodium 138 135 - 145 mmol/L   Potassium 3.9 3.5 - 5.1 mmol/L   Chloride 102 98 - 111 mmol/L   CO2 24 22 - 32 mmol/L   Glucose, Bld 162 (H) 70 - 99 mg/dL    Comment: Glucose reference range applies only to samples taken after fasting for at least 8 hours.   BUN 22 8 - 23 mg/dL   Creatinine, Ser 9.14 0.61 - 1.24 mg/dL   Calcium 8.9 8.9 - 78.2 mg/dL   Total Protein 6.2 (L) 6.5 - 8.1 g/dL   Albumin 3.1 (L) 3.5 - 5.0 g/dL   AST 20 15 - 41 U/L   ALT 12 0 - 44 U/L   Alkaline  Phosphatase 47 38 - 126 U/L   Total Bilirubin 2.4 (H) 0.0 - 1.2 mg/dL   GFR, Estimated 58 (L) >60 mL/min    Comment: (NOTE) Calculated using the CKD-EPI Creatinine Equation (2021)    Anion gap 12 5 - 15    Comment: Performed at [facility], [address] [place], [place] [address]  CBC with Differential     Status: Abnormal   Collection Time: 02/12/24  9:28 PM  Result Value Ref Range   WBC 8.9 4.0 - 10.5 K/uL   RBC 5.60 4.22 - 5.81 MIL/uL   Hemoglobin 16.9 13.0 - 17.0 g/dL   HCT 30.8 65.7 - 84.6 %   MCV 90.0 80.0 - 100.0 fL   MCH 30.2 26.0 - 34.0 pg   MCHC 33.5 30.0 - 36.0 g/dL   RDW 96.2 95.2 - 84.1 %   Platelets 174 150 - 400 K/uL   nRBC 0.0 0.0 - 0.2 %   Neutrophils Relative % 77 %   Neutro Abs 6.8 1.7 - 7.7 K/uL   Lymphocytes Relative 10 %   Lymphs Abs 0.9  0.7 - 4.0 K/uL   Monocytes Relative 13 %   Monocytes Absolute 1.1 (H) 0.1 - 1.0 K/uL   Eosinophils Relative 0 %   Eosinophils Absolute 0.0 0.0 - 0.5 K/uL   Basophils Relative 0 %   Basophils Absolute 0.0 0.0 - 0.1 K/uL   Immature Granulocytes 0 %   Abs Immature Granulocytes 0.03 0.00 - 0.07 K/uL    Comment: Performed at [facility], [address], [place], [place] [address]  Troponin I (High Sensitivity)     Status: None   Collection Time: 02/12/24  9:28 PM  Result Value Ref Range   Troponin I (High Sensitivity) 10 <18 ng/L    Comment: (NOTE) Elevated high sensitivity troponin I (hsTnI) values and significant  changes across serial measurements may suggest ACS but many other  chronic and acute conditions are known to elevate hsTnI results.  Refer to the "Links" section for chest pain algorithms and additional  guidance. Performed at [facility], [address] [place], [place] [address]   Urinalysis, w/ Reflex to Culture (Infection Suspected) -Urine, Clean Catch     Status: Abnormal   Collection Time: 02/12/24 10:35 PM  Result Value Ref Range   Specimen Source URINE, CLEAN CATCH     Color, Urine AMBER (A) YELLOW    Comment: BIOCHEMICALS MAY BE AFFECTED BY COLOR   APPearance CLEAR CLEAR   Specific Gravity, Urine 1.020 1.005 - 1.030   pH 5.0 5.0 - 8.0   Glucose, UA NEGATIVE NEGATIVE mg/dL   Hgb urine dipstick NEGATIVE NEGATIVE   Bilirubin Urine NEGATIVE NEGATIVE   Ketones, ur NEGATIVE NEGATIVE mg/dL   Protein, ur NEGATIVE NEGATIVE mg/dL   Nitrite NEGATIVE NEGATIVE   Leukocytes,Ua NEGATIVE NEGATIVE   RBC / HPF 0-5 0 - 5 RBC/hpf   WBC, UA 0-5 0 - 5 WBC/hpf    Comment:        Reflex urine culture not performed if WBC <=10, OR if Squamous epithelial cells >5. If Squamous epithelial cells >5 suggest recollection.    Bacteria, UA RARE (A) NONE SEEN   Squamous Epithelial / HPF 0-5 0 - 5 /HPF   Mucus PRESENT     Comment: Performed at [facility], [address] [place], [place] [address]    US  Abdomen Limited RUQ (LIVER/GB) Result Date: 02/12/2024 CLINICAL DATA:  Abdominal pain EXAM: ULTRASOUND ABDOMEN LIMITED RIGHT UPPER QUADRANT COMPARISON:  CT chest 02/12/2024 and CT abdomen pelvis 07/16/2023 FINDINGS: Gallbladder: Sludge is present within the gallbladder. There is marked wall thickening measuring 10 mm in thickness. A positive sonographic Jose Hood sign was noted by the sonographer. Common bile duct: Diameter: Not visualized. Liver: No focal lesion identified. Within normal limits in parenchymal echogenicity. Portal vein is patent on color Doppler imaging with normal direction of blood flow towards the liver. Other: None. IMPRESSION: Findings consistent with acute cholecystitis. Electronically Signed   By: Jose Hood M.D.   On: 02/12/2024 23:32   CT Angio Chest Aorta w/CM &/OR wo/CM Result Date: 02/12/2024 CLINICAL DATA:  Acute aortic syndrome suspected.  Abdominal pain. EXAM: CT ANGIOGRAPHY CHEST WITH CONTRAST TECHNIQUE: Multidetector CT imaging of the chest was performed using the standard protocol during bolus administration of intravenous contrast.  Multiplanar CT image reconstructions and MIPs were obtained to evaluate the vascular anatomy. RADIATION DOSE REDUCTION: This exam was performed according to the departmental dose-optimization program which includes automated exposure control, adjustment of the mA and/or kV according to patient size and/or  use of iterative reconstruction technique. CONTRAST:  75mL OMNIPAQUE  IOHEXOL  350 MG/ML SOLN COMPARISON:  09/27/2021 FINDINGS: Cardiovascular: Images are obtained during the pulmonary arterial phase after contrast material administration. There is good opacification of the central and segmental pulmonary arteries. No focal filling defects. No evidence of significant pulmonary embolus. Poor opacification of the aorta during this phase limits evaluation of the aorta 4 dissection or other intrinsic aortic abnormality. There is a ascending thoracic aortic aneurysm measuring 4.7 cm in diameter. Calcification of the aorta and coronary arteries. Normal heart size. No pericardial effusions. Mediastinum/Nodes: No enlarged mediastinal, hilar, or axillary lymph nodes. Thyroid  gland, trachea, and esophagus demonstrate no significant findings. Lungs/Pleura: Motion artifact limits examination. There is evidence of infiltration or atelectasis in both lung bases. No pleural effusion or pneumothorax. Upper Abdomen: Poor definition of the gallbladder wall suggest pericholecystic edema, possibly due to cholecystitis. No stones are demonstrated. Bilateral renal cysts are partially visualized. These are better demonstrated on prior CT abdomen and pelvis from 07/16/2023. No imaging follow-up is indicated. Musculoskeletal: Degenerative changes in the spine. No acute bony abnormalities. Review of the MIP images confirms the above findings. IMPRESSION: 1. Images were obtained during the pulmonary arterial phase. No evidence of significant pulmonary embolus. 2. Ascending aortic aneurysm measuring 4.7 cm diameter. Ascending thoracic aortic  aneurysm. Recommend semi-annual imaging followup by CTA or MRA and referral to cardiothoracic surgery if not already obtained. This recommendation follows 2010 ACCF/AHA/AATS/ACR/ASA/SCA/SCAI/SIR/STS/SVM Guidelines for the Diagnosis and Management of Patients With Thoracic Aortic Disease. Circulation. 2010; 121: Z610-R604. Aortic aneurysm NOS (ICD10-I71.9) 3. Infiltration or atelectasis in both lung bases. 4. Poorly defined gallbladder wall suggesting pericholecystic edema. This could indicate cholecystitis in the appropriate clinical setting. Consider ultrasound for further evaluation. 5. Aortic atherosclerosis. Electronically Signed   By: Boyce Byes M.D.   On: 02/12/2024 23:06    ROS  PE Blood pressure (!) 141/92, pulse (!) 104, temperature 98.4 F (36.9 C), resp. rate 16, height 6\' 1"  (1.854 m), weight 86.2 kg, SpO2 100%. Constitutional: NAD; conversant; no deformities Eyes: Moist conjunctiva; no lid lag; anicteric; PERRL Neck: Trachea midline; no thyromegaly Lungs: Normal respiratory effort; no tactile fremitus CV: RRR; no palpable thrills; no pitting edema GI: Abd soft, TTP right upper quadrant, no guarding; no palpable hepatosplenomegaly MSK: Normal gait; no clubbing/cyanosis Psychiatric: Appropriate affect; alert and oriented x3 Lymphatic: No palpable cervical or axillary lymphadenopathy Skin: No major subcutaneous nodules. Warm and dry   Assessment/Plan: 83 yo male with RUQ pain, nausea and vomiting. US  showing thickened gallbladder with sludge. A fib on eliquis . I reviewed CTA and US  showing thickened, dilated gallbladder full of sludge. -IV abx -bowel rest -hold eliquis  -HIDA and then likely surgery during this hospitalization  I reviewed last 24 h vitals and pain scores, last 48 h intake and output, last 24 h labs and trends, and last 24 h imaging results.  This care required high  level of medical decision making.   Alphonso Aschoff Brason Berthelot 02/12/2024, 11:48 PM

## 2024-02-13 ENCOUNTER — Inpatient Hospital Stay (HOSPITAL_COMMUNITY)

## 2024-02-13 ENCOUNTER — Encounter (HOSPITAL_COMMUNITY): Payer: Self-pay | Admitting: Internal Medicine

## 2024-02-13 DIAGNOSIS — K5900 Constipation, unspecified: Secondary | ICD-10-CM | POA: Diagnosis not present

## 2024-02-13 DIAGNOSIS — Z01810 Encounter for preprocedural cardiovascular examination: Secondary | ICD-10-CM

## 2024-02-13 DIAGNOSIS — I429 Cardiomyopathy, unspecified: Secondary | ICD-10-CM | POA: Diagnosis not present

## 2024-02-13 DIAGNOSIS — R000 Tachycardia, unspecified: Secondary | ICD-10-CM | POA: Diagnosis not present

## 2024-02-13 DIAGNOSIS — E785 Hyperlipidemia, unspecified: Secondary | ICD-10-CM | POA: Diagnosis not present

## 2024-02-13 DIAGNOSIS — K810 Acute cholecystitis: Secondary | ICD-10-CM | POA: Diagnosis not present

## 2024-02-13 DIAGNOSIS — R112 Nausea with vomiting, unspecified: Secondary | ICD-10-CM | POA: Diagnosis not present

## 2024-02-13 DIAGNOSIS — R1011 Right upper quadrant pain: Secondary | ICD-10-CM | POA: Diagnosis not present

## 2024-02-13 DIAGNOSIS — I10 Essential (primary) hypertension: Secondary | ICD-10-CM | POA: Diagnosis not present

## 2024-02-13 DIAGNOSIS — I4891 Unspecified atrial fibrillation: Secondary | ICD-10-CM | POA: Diagnosis not present

## 2024-02-13 DIAGNOSIS — Z7901 Long term (current) use of anticoagulants: Secondary | ICD-10-CM | POA: Diagnosis not present

## 2024-02-13 DIAGNOSIS — I5020 Unspecified systolic (congestive) heart failure: Secondary | ICD-10-CM | POA: Diagnosis not present

## 2024-02-13 DIAGNOSIS — Z5181 Encounter for therapeutic drug level monitoring: Secondary | ICD-10-CM | POA: Diagnosis not present

## 2024-02-13 DIAGNOSIS — I7121 Aneurysm of the ascending aorta, without rupture: Secondary | ICD-10-CM | POA: Diagnosis not present

## 2024-02-13 DIAGNOSIS — I351 Nonrheumatic aortic (valve) insufficiency: Secondary | ICD-10-CM | POA: Diagnosis not present

## 2024-02-13 DIAGNOSIS — I340 Nonrheumatic mitral (valve) insufficiency: Secondary | ICD-10-CM | POA: Diagnosis not present

## 2024-02-13 DIAGNOSIS — I083 Combined rheumatic disorders of mitral, aortic and tricuspid valves: Secondary | ICD-10-CM | POA: Diagnosis not present

## 2024-02-13 DIAGNOSIS — I480 Paroxysmal atrial fibrillation: Secondary | ICD-10-CM

## 2024-02-13 DIAGNOSIS — M109 Gout, unspecified: Secondary | ICD-10-CM | POA: Diagnosis not present

## 2024-02-13 DIAGNOSIS — R350 Frequency of micturition: Secondary | ICD-10-CM | POA: Diagnosis present

## 2024-02-13 DIAGNOSIS — I482 Chronic atrial fibrillation, unspecified: Secondary | ICD-10-CM | POA: Diagnosis not present

## 2024-02-13 DIAGNOSIS — I361 Nonrheumatic tricuspid (valve) insufficiency: Secondary | ICD-10-CM | POA: Diagnosis not present

## 2024-02-13 DIAGNOSIS — I4819 Other persistent atrial fibrillation: Secondary | ICD-10-CM | POA: Diagnosis not present

## 2024-02-13 DIAGNOSIS — Z8249 Family history of ischemic heart disease and other diseases of the circulatory system: Secondary | ICD-10-CM | POA: Diagnosis not present

## 2024-02-13 DIAGNOSIS — N401 Enlarged prostate with lower urinary tract symptoms: Secondary | ICD-10-CM | POA: Diagnosis present

## 2024-02-13 DIAGNOSIS — I110 Hypertensive heart disease with heart failure: Secondary | ICD-10-CM | POA: Diagnosis not present

## 2024-02-13 DIAGNOSIS — K82A1 Gangrene of gallbladder in cholecystitis: Secondary | ICD-10-CM | POA: Diagnosis not present

## 2024-02-13 DIAGNOSIS — K812 Acute cholecystitis with chronic cholecystitis: Secondary | ICD-10-CM | POA: Diagnosis not present

## 2024-02-13 DIAGNOSIS — Z79899 Other long term (current) drug therapy: Secondary | ICD-10-CM | POA: Diagnosis not present

## 2024-02-13 LAB — COMPREHENSIVE METABOLIC PANEL WITH GFR
ALT: 12 U/L (ref 0–44)
AST: 17 U/L (ref 15–41)
Albumin: 2.9 g/dL — ABNORMAL LOW (ref 3.5–5.0)
Alkaline Phosphatase: 43 U/L (ref 38–126)
Anion gap: 9 (ref 5–15)
BUN: 14 mg/dL (ref 8–23)
CO2: 23 mmol/L (ref 22–32)
Calcium: 8.4 mg/dL — ABNORMAL LOW (ref 8.9–10.3)
Chloride: 104 mmol/L (ref 98–111)
Creatinine, Ser: 1.03 mg/dL (ref 0.61–1.24)
GFR, Estimated: >60 mL/min (ref >60)
Glucose, Bld: 114 mg/dL — ABNORMAL HIGH (ref 70–99)
Potassium: 3.7 mmol/L (ref 3.5–5.1)
Sodium: 136 mmol/L (ref 135–145)
Total Bilirubin: 3.4 mg/dL — ABNORMAL HIGH (ref 0.0–1.2)
Total Protein: 5.9 g/dL — ABNORMAL LOW (ref 6.5–8.1)

## 2024-02-13 LAB — CBC
HCT: 47.9 % (ref 39.0–52.0)
Hemoglobin: 16.5 g/dL (ref 13.0–17.0)
MCH: 30 pg (ref 26.0–34.0)
MCHC: 34.4 g/dL (ref 30.0–36.0)
MCV: 87.1 fL (ref 80.0–100.0)
Platelets: 182 10*3/uL (ref 150–400)
RBC: 5.5 MIL/uL (ref 4.22–5.81)
RDW: 14.1 % (ref 11.5–15.5)
WBC: 8.8 10*3/uL (ref 4.0–10.5)
nRBC: 0 % (ref 0.0–0.2)

## 2024-02-13 LAB — HEPATIC FUNCTION PANEL
ALT: 11 U/L (ref 0–44)
AST: 16 U/L (ref 15–41)
Albumin: 2.9 g/dL — ABNORMAL LOW (ref 3.5–5.0)
Alkaline Phosphatase: 47 U/L (ref 38–126)
Bilirubin, Direct: 0.8 mg/dL — ABNORMAL HIGH (ref 0.0–0.2)
Indirect Bilirubin: 2.5 mg/dL — ABNORMAL HIGH (ref 0.3–0.9)
Total Bilirubin: 3.3 mg/dL — ABNORMAL HIGH (ref 0.0–1.2)
Total Protein: 6 g/dL — ABNORMAL LOW (ref 6.5–8.1)

## 2024-02-13 LAB — HEPARIN LEVEL (UNFRACTIONATED): Heparin Unfractionated: 0.68 [IU]/mL (ref 0.30–0.70)

## 2024-02-13 LAB — APTT
aPTT: 49 s — ABNORMAL HIGH (ref 24–36)
aPTT: 83 s — ABNORMAL HIGH (ref 24–36)

## 2024-02-13 MED ORDER — TECHNETIUM TC 99M MEBROFENIN IV KIT
5.2000 | PACK | Freq: Once | INTRAVENOUS | Status: AC | PRN
  Administered 2024-02-13: 5.2 via INTRAVENOUS

## 2024-02-13 MED ORDER — METOPROLOL TARTRATE 25 MG PO TABS
25.0000 mg | ORAL_TABLET | Freq: Two times a day (BID) | ORAL | Status: DC
  Administered 2024-02-13 (×2): 25 mg via ORAL
  Filled 2024-02-13 (×2): qty 1

## 2024-02-13 MED ORDER — FINASTERIDE 5 MG PO TABS
5.0000 mg | ORAL_TABLET | Freq: Every day | ORAL | Status: DC
  Administered 2024-02-13 – 2024-02-21 (×9): 5 mg via ORAL
  Filled 2024-02-13 (×9): qty 1

## 2024-02-13 MED ORDER — LACTATED RINGERS IV SOLN: INTRAVENOUS | Status: AC

## 2024-02-13 MED ORDER — PROBENECID 500 MG PO TABS
500.0000 mg | ORAL_TABLET | Freq: Every day | ORAL | Status: DC
  Administered 2024-02-13 – 2024-02-21 (×9): 500 mg via ORAL
  Filled 2024-02-13 (×9): qty 1

## 2024-02-13 MED ORDER — ONDANSETRON HCL 4 MG PO TABS: 4.0000 mg | ORAL_TABLET | Freq: Four times a day (QID) | ORAL | Status: DC | PRN

## 2024-02-13 MED ORDER — OXYCODONE HCL 5 MG PO TABS: 5.0000 mg | ORAL_TABLET | ORAL | Status: DC | PRN

## 2024-02-13 MED ORDER — MORPHINE SULFATE (PF) 4 MG/ML IV SOLN
3.0000 mg | Freq: Once | INTRAVENOUS | Status: AC
  Administered 2024-02-13: 3 mg via INTRAVENOUS
  Filled 2024-02-13: qty 1

## 2024-02-13 MED ORDER — ONDANSETRON HCL 4 MG/2ML IJ SOLN
4.0000 mg | Freq: Four times a day (QID) | INTRAMUSCULAR | Status: DC | PRN
  Administered 2024-02-15: 4 mg via INTRAVENOUS
  Filled 2024-02-13: qty 2

## 2024-02-13 MED ORDER — TAMSULOSIN HCL 0.4 MG PO CAPS
0.4000 mg | ORAL_CAPSULE | Freq: Every day | ORAL | Status: DC
  Administered 2024-02-13 – 2024-02-21 (×9): 0.4 mg via ORAL
  Filled 2024-02-13 (×9): qty 1

## 2024-02-13 MED ORDER — MORPHINE SULFATE (PF) 2 MG/ML IV SOLN: 2.0000 mg | INTRAVENOUS | Status: DC | PRN

## 2024-02-13 MED ORDER — HEPARIN (PORCINE) 25000 UT/250ML-% IV SOLN
1350.0000 [IU]/h | INTRAVENOUS | Status: DC
  Administered 2024-02-13: 1350 [IU]/h via INTRAVENOUS
  Administered 2024-02-13: 1200 [IU]/h via INTRAVENOUS
  Administered 2024-02-14: 1350 [IU]/h via INTRAVENOUS
  Filled 2024-02-13 (×4): qty 250

## 2024-02-13 NOTE — H&P (Signed)
 [facility] H&P   Patient Demographics:    Jose Hood, is a 83 y.o. male  MRN: [id number]   DOB - Feb 11, 1941  Admit Date - 02/12/2024  Outpatient Primary MD for the patient is Ronna Coho, MD  Referring MD/NP/PA: Dr. Derrel Flies  Patient coming from: Home  Chief Complaint  Patient presents with   Abdominal Pain      HPI:    Jose Hood  is a 83 y.o. male, with past medical history of gout, hypertension, atrial fibrillation, on Eliquis , patient presents to ED secondary to complaints of abdominal pain, nausea, vomiting, reports that started all on Friday, as well increased frequency of urination, reports it has worsened today, which prompted him to come to ED, he does report some chills at home as well. - In ED patient was found to be in A-fib with RVR, he was afebrile, labs were significant for elevated total bilirubin at 2.4, troponins were negative, white blood cell count within normal limit, UA was negative, he had right upper quadrant tenderness to palpation, ultrasound and CT abdomen with gallbladder wall thickening, and concerning for acute cholecystitis, general surgery were consulted and recommended hospitalist admission.    Review of systems:      A full 10 point Review of Systems was done, except as stated above, all other Review of Systems were negative.   With Past History of the following :    Past Medical History:  Diagnosis Date   Gout    Hypertension    Shingles 07/20/2016      Past Surgical History:  Procedure Laterality Date   HERNIA REPAIR     NO PAST SURGERIES        Social History:     Social History   Tobacco Use   Smoking status: Never   Smokeless tobacco: Never  Substance Use Topics   Alcohol use: No        Family History :     Family History  Problem Relation Age of Onset   Heart disease Father    Colon cancer Neg Hx       Home Medications:   Prior to Admission medications   Medication Sig Start Date End Date Taking? Authorizing Provider  amLODipine (NORVASC) 5 MG tablet Take 10 mg by mouth daily. 07/03/20  Yes [provider]  apixaban  (ELIQUIS ) 5 MG TABS tablet Take 1 tablet (5 mg total) by mouth 2 (two) times daily. 01/25/24  Yes O'Neal, Cathay Clonts, MD  finasteride  (PROSCAR ) 5 MG tablet TAKE 1 TABLET BY MOUTH EVERY DAY 06/25/18  Yes Hillery Lown, MD  lisinopril  (PRINIVIL ,ZESTRIL ) 40 MG tablet Take 1 tablet (40 mg total) by mouth daily. 06/19/18  Yes Hillery Lown, MD  metoprolol  tartrate (LOPRESSOR ) 25 MG tablet Take 1 tablet (25 mg total) by mouth 2 (two) times daily. 01/25/24 04/24/24 Yes O'Neal, Cathay Clonts, MD  probenecid  (  BENEMID ) 500 MG tablet Take 1 tablet (500 mg total) by mouth daily. 06/19/18  Yes Hillery Lown, MD  tamsulosin  (FLOMAX ) 0.4 MG CAPS capsule TAKE ONE CAPSULE BY MOUTH EVERY DAY 06/25/18  Yes Hillery Lown, MD     Allergies:     Allergies  Allergen Reactions   Metronidazole Other (See Comments)    Pt denies allergy     Physical Exam:   Vitals  Blood pressure (!) 141/83, pulse 100, temperature 98.4 F (36.9 C), resp. rate (!) 21, height 6\' 1"  (1.854 m), weight 86.2 kg, SpO2 97%.   1. General Frail, elderly male, laying in bed, no apparent distress  2. Normal affect and insight, Not Suicidal or Homicidal, Awake Alert, Oriented X 3.  3. No F.N deficits, ALL C.Nerves Intact, Strength 5/5 all 4 extremities, Sensation intact all 4 extremities, Plantars down going.  4. Ears and Eyes appear Normal, Conjunctivae clear, PERRLA. Moist Oral Mucosa.  5. Supple Neck, No JVD, No cervical lymphadenopathy appriciated, No Carotid Bruits.  6. Symmetrical Chest wall movement, Good air movement bilaterally, CTAB.  7.  Irregular irregular, tachycardic, No Gallops, Rubs or Murmurs, No Parasternal Heave.  8. Positive Bowel Sounds, Abdomen Soft, right upper  quadrant tenderness, No organomegaly appriciated,No rebound -guarding or rigidity.  9.  No Cyanosis, Normal Skin Turgor, No Skin Rash or Bruise.  10. Good muscle tone,  joints appear normal , no effusions, Normal ROM.    Data Review:    CBC Recent Labs  Lab 02/12/24 2128  WBC 8.9  HGB 16.9  HCT 50.4  PLT 174  MCV 90.0  MCH 30.2  MCHC 33.5  RDW 14.2  LYMPHSABS 0.9  MONOABS 1.1*  EOSABS 0.0  BASOSABS 0.0   ------------------------------------------------------------------------------------------------------------------  Chemistries  Recent Labs  Lab 02/12/24 2128  NA 138  K 3.9  CL 102  CO2 24  GLUCOSE 162*  BUN 22  CREATININE 1.23  CALCIUM 8.9  AST 20  ALT 12  ALKPHOS 47  BILITOT 2.4*   ------------------------------------------------------------------------------------------------------------------ estimated creatinine clearance is 51.4 mL/min (by C-G formula based on SCr of 1.23 mg/dL). ------------------------------------------------------------------------------------------------------------------ No results for input(s): "TSH", "T4TOTAL", "T3FREE", "THYROIDAB" in the last 72 hours.  Invalid input(s): "FREET3"  Coagulation profile No results for input(s): "INR", "PROTIME" in the last 168 hours. ------------------------------------------------------------------------------------------------------------------- No results for input(s): "DDIMER" in the last 72 hours. -------------------------------------------------------------------------------------------------------------------  Cardiac Enzymes No results for input(s): "CKMB", "TROPONINI", "MYOGLOBIN" in the last 168 hours.  Invalid input(s): "CK" ------------------------------------------------------------------------------------------------------------------    Component Value Date/Time   BNP 96.9 02/12/2024 2127      ---------------------------------------------------------------------------------------------------------------  Urinalysis    Component Value Date/Time   COLORURINE AMBER (A) 02/12/2024 2235   APPEARANCEUR CLEAR 02/12/2024 2235   LABSPEC 1.020 02/12/2024 2235   PHURINE 5.0 02/12/2024 2235   GLUCOSEU NEGATIVE 02/12/2024 2235   HGBUR NEGATIVE 02/12/2024 2235   BILIRUBINUR NEGATIVE 02/12/2024 2235   KETONESUR NEGATIVE 02/12/2024 2235   PROTEINUR NEGATIVE 02/12/2024 2235   UROBILINOGEN 1.0 10/24/2010 0949   NITRITE NEGATIVE 02/12/2024 2235   LEUKOCYTESUR NEGATIVE 02/12/2024 2235    ----------------------------------------------------------------------------------------------------------------   Imaging Results:    US  Abdomen Limited RUQ (LIVER/GB) Result Date: 02/12/2024 CLINICAL DATA:  Abdominal pain EXAM: ULTRASOUND ABDOMEN LIMITED RIGHT UPPER QUADRANT COMPARISON:  CT chest 02/12/2024 and CT abdomen pelvis 07/16/2023 FINDINGS: Gallbladder: Sludge is present within the gallbladder. There is marked wall thickening measuring 10 mm in thickness. A positive sonographic Abigail Abler sign was noted by the sonographer. Common bile duct: Diameter:  Not visualized. Liver: No focal lesion identified. Within normal limits in parenchymal echogenicity. Portal vein is patent on color Doppler imaging with normal direction of blood flow towards the liver. Other: None. IMPRESSION: Findings consistent with acute cholecystitis. Electronically Signed   By: Rozell Cornet M.D.   On: 02/12/2024 23:32   CT Angio Chest Aorta w/CM &/OR wo/CM Result Date: 02/12/2024 CLINICAL DATA:  Acute aortic syndrome suspected.  Abdominal pain. EXAM: CT ANGIOGRAPHY CHEST WITH CONTRAST TECHNIQUE: Multidetector CT imaging of the chest was performed using the standard protocol during bolus administration of intravenous contrast. Multiplanar CT image reconstructions and MIPs were obtained to evaluate the vascular anatomy. RADIATION  DOSE REDUCTION: This exam was performed according to the departmental dose-optimization program which includes automated exposure control, adjustment of the mA and/or kV according to patient size and/or use of iterative reconstruction technique. CONTRAST:  75mL OMNIPAQUE  IOHEXOL  350 MG/ML SOLN COMPARISON:  09/27/2021 FINDINGS: Cardiovascular: Images are obtained during the pulmonary arterial phase after contrast material administration. There is good opacification of the central and segmental pulmonary arteries. No focal filling defects. No evidence of significant pulmonary embolus. Poor opacification of the aorta during this phase limits evaluation of the aorta 4 dissection or other intrinsic aortic abnormality. There is a ascending thoracic aortic aneurysm measuring 4.7 cm in diameter. Calcification of the aorta and coronary arteries. Normal heart size. No pericardial effusions. Mediastinum/Nodes: No enlarged mediastinal, hilar, or axillary lymph nodes. Thyroid  gland, trachea, and esophagus demonstrate no significant findings. Lungs/Pleura: Motion artifact limits examination. There is evidence of infiltration or atelectasis in both lung bases. No pleural effusion or pneumothorax. Upper Abdomen: Poor definition of the gallbladder wall suggest pericholecystic edema, possibly due to cholecystitis. No stones are demonstrated. Bilateral renal cysts are partially visualized. These are better demonstrated on prior CT abdomen and pelvis from 07/16/2023. No imaging follow-up is indicated. Musculoskeletal: Degenerative changes in the spine. No acute bony abnormalities. Review of the MIP images confirms the above findings. IMPRESSION: 1. Images were obtained during the pulmonary arterial phase. No evidence of significant pulmonary embolus. 2. Ascending aortic aneurysm measuring 4.7 cm diameter. Ascending thoracic aortic aneurysm. Recommend semi-annual imaging followup by CTA or MRA and referral to cardiothoracic surgery if  not already obtained. This recommendation follows 2010 ACCF/AHA/AATS/ACR/ASA/SCA/SCAI/SIR/STS/SVM Guidelines for the Diagnosis and Management of Patients With Thoracic Aortic Disease. Circulation. 2010; 121: R518-A416. Aortic aneurysm NOS (ICD10-I71.9) 3. Infiltration or atelectasis in both lung bases. 4. Poorly defined gallbladder wall suggesting pericholecystic edema. This could indicate cholecystitis in the appropriate clinical setting. Consider ultrasound for further evaluation. 5. Aortic atherosclerosis. Electronically Signed   By: Boyce Byes M.D.   On: 02/12/2024 23:06    EKG:  Vent. rate 137 BPM PR interval * ms QRS duration 89 ms QT/QTcB 261/394 ms P-R-T axes * 86 -38 Atrial fibrillation with rapid V-rate Borderline right axis deviation Minimal ST depression, inferior leads Borderline T abnormalities, inferior leads  Assessment & Plan:    Principal Problem:   Acute cholecystitis Active Problems:   Dyslipidemia   Essential hypertension   Paroxysmal atrial fibrillation (HCC)   Abdominal pain, nausea, vomiting Acute cholecystitis -Patient's finding concerning for acute cholecystitis, right upper quadrant pain, ultrasound showing thickened gallbladder with sludge, . - General Surgery input greatly appreciated, plan for surgery during this hospitalization  - Will obtain HIDA scan.will hold on pain medications pending HIDA scan. - Continue with IV antibiotics. - Continue with IV fluids -continue with bowel rest -continue with nausea medication - Continue to hold  Eliquis , meanwhile we will keep on heparin gtt.   Paroxysmal A-fib with RVR - Heart rate uncontrolled, in the 150s, started on Cardizem drip, resume home dose metoprolol  to optimize heart rate control -on Eliquis  for anticoagulation, last dose Monday morning, will start on heparin gtt. given CHA2DS2-VASc score> 2  Hypertension -Resume home dose metoprolol , for now we will hold on amlodipine and lisinopril  to  allow more room for Cardizem drip for better A-fib with RVR control  BPH -Continue with Flomax  and Proscar   Gout -Continue with probenecid   Ascending aortic aneurysm measuring 4.7 cm diameter.  - Cardiology recommend semi-annual imaging followup by CTA or MRA and referral to cardiothoracic surgery if not already obtained.   DVT Prophylaxis Heparin drip  AM Labs Ordered, also please review Full Orders  Family Communication: Admission, patients condition and plan of care including tests being ordered have been discussed with the patient who indicate understanding and agree with the plan and Code Status.  Code Status full code  Likely DC to home  Consults called: General Surgery  Admission status: Inpatient  Time spent in minutes : 70 minutes   Seena Dadds M.D on 02/13/2024 at 3:26 AM   [facility] - Office  [phone]

## 2024-02-13 NOTE — Progress Notes (Signed)
 PHARMACY - ANTICOAGULATION CONSULT NOTE  Pharmacy Consult for IV heparin Indication: atrial fibrillation  Patient Measurements: Height: 6\' 1"  (185.4 cm) Weight: 86.2 kg (190 lb) IBW/kg (Calculated) : 79.9 HEPARIN DW (KG): 86.2  Labs: Recent Labs    02/12/24 2128 02/13/24 0543 02/13/24 1036  HGB 16.9 16.5  --   HCT 50.4 47.9  --   PLT 174 182  --   APTT  --   --  49*  HEPARINUNFRC  --   --  0.68  CREATININE 1.23 1.03  --   TROPONINIHS 10  --   --   Estimated Creatinine Clearance: 61.4 mL/min (by C-G formula based on SCr of 1.03 mg/dL).  Medical History: Past Medical History:  Diagnosis Date   Gout    Hypertension    Shingles 07/20/2016   Assessment: Morgen Clendenen is a 83 y.o. year old male admitted on 02/12/2024 with concern for abdominal pain with concern for intra-abdominal infection requiring surgery intervention. Eliquis  prior to admission for afib (last dose 02/11/24 PM). Pharmacy consulted to dose heparin.  4/29: Heparin level 0.68 (therapeutic) and aPTT 49 (subtherapeutic) on heparin 1200 units/hr. Levels not correlating due to recent Eliquis  administration, will utilize aPTT for dosing. No issues with infusion running or signs of bleeding per RN. CBC stable (Hgb 16.5, PLT 182).   Goal of Therapy:  aPTT 66-102 seconds Monitor platelets by anticoagulation protocol: Yes   Plan:  Increase heparin infusion at 1350 units/hr (no bolus) 8 aPTT  Daily aPTT, heparin level, CBC, and monitoring for bleeding F/u plans for anticoagulation and surgery   Thank you for allowing pharmacy to participate in this patient's care.  Volney Grumbles, PharmD PGY-1 Acute Care Pharmacy Resident 02/13/2024 11:35 AM

## 2024-02-13 NOTE — Hospital Course (Addendum)
 PMH of paroxysmal A-fib on Eliquis , HTN, gout presented to the hospital with complaints of abdominal pain and nausea and vomiting.  Found to have acute cholecystitis.  General surgery was consulted. Cardiology was consulted as well for preop clearance. Currently on IV heparin .  Scheduled for surgery on 5/1.  Assessment and Plan: Acute cholecystitis Presented with abdominal pain.  Ultrasound shows gallbladder wall thickening with sludge. General surgery consulted. HIDA scan is actually positive. Currently on IV antibiotic. Underwent lap chole on 5/1. Monitor postop recovery. Cardiology cleared the patient for surgery.   Paroxysmal A-fib with RVR Heart rate uncontrolled, in the 150s, started on Cardizem  drip,  Cardiology following.  Also on oral metoprolol . Management per cardiology.  On IV heparin .  As needed Lopressor  added.   Hypertension Resume home dose metoprolol  On Cardizem .   BPH Continue with Flomax  and Proscar    Gout Continue with probenecid    Ascending aortic aneurysm measuring 4.7 cm diameter.   Cardiology recommend semi-annual imaging followup by CTA or MRA and referral to cardiothoracic surgery if not already obtained.

## 2024-02-13 NOTE — Progress Notes (Signed)
 PT Cancellation Note  Patient Details Name: Jose Hood MRN: [id number] DOB: 1941-08-10   Cancelled Treatment:    Reason Eval/Treat Not Completed: Medical issues which prohibited therapy; will hold until after HIDA scan as pt unable to have pain meds prior.    [name] 02/13/2024, 9:15 AM Abigail Hoff, PT Acute Rehabilitation Services [phone] 02/13/2024

## 2024-02-13 NOTE — Progress Notes (Signed)
 PHARMACY - ANTICOAGULATION CONSULT NOTE  Pharmacy Consult for IV heparin Indication: atrial fibrillation  Patient Measurements: Height: 6\' 1"  (185.4 cm) Weight: 85.7 kg (188 lb 14.4 oz) IBW/kg (Calculated) : 79.9 HEPARIN DW (KG): 85.7  Labs: Recent Labs    02/12/24 2128 02/13/24 0543 02/13/24 1036 02/13/24 2105  HGB 16.9 16.5  --   --   HCT 50.4 47.9  --   --   PLT 174 182  --   --   APTT  --   --  49* 83*  HEPARINUNFRC  --   --  0.68  --   CREATININE 1.23 1.03  --   --   TROPONINIHS 10  --   --   --   Estimated Creatinine Clearance: 61.4 mL/min (by C-G formula based on SCr of 1.03 mg/dL).  Medical History: Past Medical History:  Diagnosis Date   Gout    Hypertension    Shingles 07/20/2016   Assessment: Jose Hood is a 83 y.o. year old male admitted on 02/12/2024 with concern for abdominal pain with concern for intra-abdominal infection requiring surgery intervention. Eliquis  prior to admission for afib (last dose 02/11/24 PM). Pharmacy consulted to dose heparin.  4/29: Heparin level 0.68 (therapeutic) and aPTT 49 (subtherapeutic) on heparin 1200 units/hr. Levels not correlating due to recent Eliquis  administration, will utilize aPTT for dosing. No issues with infusion running or signs of bleeding per RN. CBC stable (Hgb 16.5, PLT 182).   4/29 PM: aPTT 83 sec, therapeutic after rate increase to 1350 units/hr.    Goal of Therapy:  aPTT 66-102 seconds Monitor platelets by anticoagulation protocol: Yes   Plan:  Continue heparin infusion at 1350 units/hr Daily aPTT, heparin level, CBC, and monitoring for bleeding F/u plans for anticoagulation and surgery   Thank you for allowing pharmacy to participate in this patient's care.  Cecillia Cogan, PharmD Clinical Pharmacist 02/13/2024  9:41 PM

## 2024-02-13 NOTE — Plan of Care (Signed)

## 2024-02-13 NOTE — ED Notes (Addendum)
 Spoke to [phone]), states pt can have morning meds but can not have any "pain meds" before procedure.

## 2024-02-13 NOTE — Consult Note (Signed)
 CARDIOLOGY CONSULT NOTE       Patient ID: Jose Hood MRN: [id number] DOB/AGE: 02-07-1941 83 y.o.  Admit date: 02/12/2024 Referring Physician: Marlin Simmonds PA surgery Primary Physician: Ronna Coho, MD Primary Cardiologist: Elodia Hailstone Reason for Consultation: Preoperative  Principal Problem:   Acute cholecystitis Active Problems:   Dyslipidemia   Essential hypertension   Paroxysmal atrial fibrillation [facility])   HPI:  83 y.o. asked to see for preoperative cardiovascular consult. Patient admitted with cholecystitis pending likely surgery Last seen by Dr Elodia Hailstone 01/25/24 with new onset afib. Started on eliquis . No notes regarding DCC. He has a history of HTN and dilated ascending aorta 4.3 cm by TTE. TTE 11/11/22 EF 60-65% AV sclerosis with moderate AR. No history of MI, CAD, CHF. He has been active up to now with some note of higher HR;s than usual. CT and US  consistent with acute cholecystitis with elevated TB. Still with RUQ pain. No dyspnea chest pain or palpitations Telemetry with afib rates 90-100 on iv cardizem Eliquis  held on heparin   ROS All other systems reviewed and negative except as noted above  Past Medical History:  Diagnosis Date   Gout    Hypertension    Shingles 07/20/2016    Family History  Problem Relation Age of Onset   Heart disease Father    Colon cancer Neg Hx     Social History   Socioeconomic History   Marital status: Married    Spouse name: Not on file   Number of children: 2   Years of education: Not on file   Highest education level: Not on file  Occupational History   Occupation: Worked for [employer]  Tobacco Use   Smoking status: Never   Smokeless tobacco: Never  Substance and Sexual Activity   Alcohol use: No   Drug use: No   Sexual activity: Not on file  Other Topics Concern   Not on file  Social History Narrative   Not on file   Social Drivers of Health   Financial Resource Strain: Not on file  Food Insecurity: Not on file   Transportation Needs: Not on file  Physical Activity: Not on file  Stress: Not on file  Social Connections: Not on file  Intimate Partner Violence: Not on file    Past Surgical History:  Procedure Laterality Date   HERNIA REPAIR     NO PAST SURGERIES        Current Facility-Administered Medications:    0.9 %  sodium chloride  infusion, 500 mL, Intravenous, Continuous, Kenney Peacemaker, MD   diltiazem (CARDIZEM) 125 mg in dextrose 5% 125 mL (1 mg/mL) infusion, 5-15 mg/hr, Intravenous, Continuous, Elgergawy, Ardia Kraft, MD, Last Rate: 15 mL/hr at 02/13/24 0840, 15 mg/hr at 02/13/24 0840   finasteride  (PROSCAR ) tablet 5 mg, 5 mg, Oral, Daily, Elgergawy, Dawood S, MD, 5 mg at 02/13/24 1051   heparin ADULT infusion 100 units/mL (25000 units/250mL), 1,200 Units/hr, Intravenous, Continuous, Elgergawy, Ardia Kraft, MD, Last Rate: 12 mL/hr at 02/13/24 0730, 1,200 Units/hr at 02/13/24 0730   lactated ringers infusion, , Intravenous, Continuous, Last Rate: 100 mL/hr at 02/13/24 0730, Rate Verify at 02/13/24 0730 **FOLLOWED BY** lactated ringers infusion, , Intravenous, Continuous, Elgergawy, Ardia Kraft, MD   metoprolol  tartrate (LOPRESSOR ) tablet 25 mg, 25 mg, Oral, BID, Elgergawy, Dawood S, MD, 25 mg at 02/13/24 1051   ondansetron  (ZOFRAN ) tablet 4 mg, 4 mg, Oral, Q6H PRN **OR** ondansetron  (ZOFRAN ) injection 4 mg, 4 mg, Intravenous, Q6H PRN, Elgergawy, Ardia Kraft,  MD   piperacillin-tazobactam (ZOSYN) IVPB 3.375 g, 3.375 g, Intravenous, Q8H, Elgergawy, Ardia Kraft, MD, Stopped at 02/13/24 0912   probenecid  (BENEMID ) tablet 500 mg, 500 mg, Oral, Daily, Elgergawy, Dawood S, MD, 500 mg at 02/13/24 1051   tamsulosin  (FLOMAX ) capsule 0.4 mg, 0.4 mg, Oral, Daily, Elgergawy, Dawood S, MD, 0.4 mg at 02/13/24 1051  Current Outpatient Medications:    amLODipine (NORVASC) 5 MG tablet, Take 10 mg by mouth daily., Disp: , Rfl:    apixaban  (ELIQUIS ) 5 MG TABS tablet, Take 1 tablet (5 mg total) by mouth 2 (two) times daily.,  Disp: 180 tablet, Rfl: 0   finasteride  (PROSCAR ) 5 MG tablet, TAKE 1 TABLET BY MOUTH EVERY DAY, Disp: 90 tablet, Rfl: 3   lisinopril  (PRINIVIL ,ZESTRIL ) 40 MG tablet, Take 1 tablet (40 mg total) by mouth daily., Disp: 90 tablet, Rfl: 4   metoprolol  tartrate (LOPRESSOR ) 25 MG tablet, Take 1 tablet (25 mg total) by mouth 2 (two) times daily., Disp: 180 tablet, Rfl: 3   probenecid  (BENEMID ) 500 MG tablet, Take 1 tablet (500 mg total) by mouth daily., Disp: 90 tablet, Rfl: 4   tamsulosin  (FLOMAX ) 0.4 MG CAPS capsule, TAKE ONE CAPSULE BY MOUTH EVERY DAY, Disp: 90 capsule, Rfl: 3  finasteride   5 mg Oral Daily   metoprolol  tartrate  25 mg Oral BID   probenecid   500 mg Oral Daily   tamsulosin   0.4 mg Oral Daily    sodium chloride      diltiazem (CARDIZEM) infusion 15 mg/hr (02/13/24 0840)   heparin 1,200 Units/hr (02/13/24 0730)   lactated ringers 100 mL/hr at 02/13/24 0730   Followed by   lactated ringers     piperacillin-tazobactam (ZOSYN)  IV Stopped (02/13/24 0912)    Physical Exam: Blood pressure 132/81, pulse 92, temperature 97.9 F (36.6 C), temperature source Oral, resp. rate 20, height 6\' 1"  (1.854 m), weight 86.2 kg, SpO2 98%.    Elderly male Not septic appearing SEM and AR murmur Mild RUQ pain no rebound or distention No edema Palpable pedal pulses   Labs:   Lab Results  Component Value Date   WBC 8.8 02/13/2024   HGB 16.5 02/13/2024   HCT 47.9 02/13/2024   MCV 87.1 02/13/2024   PLT 182 02/13/2024    Recent Labs  Lab 02/13/24 0543  NA 136  K 3.7  CL 104  CO2 23  BUN 14  CREATININE 1.03  CALCIUM 8.4*  PROT 6.0*  5.9*  BILITOT 3.3*  3.4*  ALKPHOS 47  43  ALT 11  12  AST 16  17  GLUCOSE 114*   No results found for: "CKTOTAL", "CKMB", "CKMBINDEX", "TROPONINI"  Lab Results  Component Value Date   CHOL 206 (H) 01/25/2024   CHOL 153 06/19/2018   CHOL 163 06/16/2017   Lab Results  Component Value Date   HDL 42 01/25/2024   HDL 31.20 (L) 06/19/2018    HDL 27.50 (L) 06/16/2017   Lab Results  Component Value Date   LDLCALC 127 (H) 01/25/2024   LDLCALC 83 06/19/2018   LDLCALC 89 05/04/2012   Lab Results  Component Value Date   TRIG 206 (H) 01/25/2024   TRIG 192.0 (H) 06/19/2018   TRIG 395.0 (H) 06/16/2017   Lab Results  Component Value Date   CHOLHDL 4.9 01/25/2024   CHOLHDL 5 06/19/2018   CHOLHDL 6 06/16/2017   Lab Results  Component Value Date   LDLDIRECT 56.0 06/16/2017   LDLDIRECT 74.0 06/13/2016   LDLDIRECT 76.5  06/09/2014      Radiology: US  Abdomen Limited RUQ (LIVER/GB) Result Date: 02/12/2024 CLINICAL DATA:  Abdominal pain EXAM: ULTRASOUND ABDOMEN LIMITED RIGHT UPPER QUADRANT COMPARISON:  CT chest 02/12/2024 and CT abdomen pelvis 07/16/2023 FINDINGS: Gallbladder: Sludge is present within the gallbladder. There is marked wall thickening measuring 10 mm in thickness. A positive sonographic Abigail Abler sign was noted by the sonographer. Common bile duct: Diameter: Not visualized. Liver: No focal lesion identified. Within normal limits in parenchymal echogenicity. Portal vein is patent on color Doppler imaging with normal direction of blood flow towards the liver. Other: None. IMPRESSION: Findings consistent with acute cholecystitis. Electronically Signed   By: Rozell Cornet M.D.   On: 02/12/2024 23:32   CT Angio Chest Aorta w/CM &/OR wo/CM Result Date: 02/12/2024 CLINICAL DATA:  Acute aortic syndrome suspected.  Abdominal pain. EXAM: CT ANGIOGRAPHY CHEST WITH CONTRAST TECHNIQUE: Multidetector CT imaging of the chest was performed using the standard protocol during bolus administration of intravenous contrast. Multiplanar CT image reconstructions and MIPs were obtained to evaluate the vascular anatomy. RADIATION DOSE REDUCTION: This exam was performed according to the departmental dose-optimization program which includes automated exposure control, adjustment of the mA and/or kV according to patient size and/or use of iterative  reconstruction technique. CONTRAST:  75mL OMNIPAQUE  IOHEXOL  350 MG/ML SOLN COMPARISON:  09/27/2021 FINDINGS: Cardiovascular: Images are obtained during the pulmonary arterial phase after contrast material administration. There is good opacification of the central and segmental pulmonary arteries. No focal filling defects. No evidence of significant pulmonary embolus. Poor opacification of the aorta during this phase limits evaluation of the aorta 4 dissection or other intrinsic aortic abnormality. There is a ascending thoracic aortic aneurysm measuring 4.7 cm in diameter. Calcification of the aorta and coronary arteries. Normal heart size. No pericardial effusions. Mediastinum/Nodes: No enlarged mediastinal, hilar, or axillary lymph nodes. Thyroid  gland, trachea, and esophagus demonstrate no significant findings. Lungs/Pleura: Motion artifact limits examination. There is evidence of infiltration or atelectasis in both lung bases. No pleural effusion or pneumothorax. Upper Abdomen: Poor definition of the gallbladder wall suggest pericholecystic edema, possibly due to cholecystitis. No stones are demonstrated. Bilateral renal cysts are partially visualized. These are better demonstrated on prior CT abdomen and pelvis from 07/16/2023. No imaging follow-up is indicated. Musculoskeletal: Degenerative changes in the spine. No acute bony abnormalities. Review of the MIP images confirms the above findings. IMPRESSION: 1. Images were obtained during the pulmonary arterial phase. No evidence of significant pulmonary embolus. 2. Ascending aortic aneurysm measuring 4.7 cm diameter. Ascending thoracic aortic aneurysm. Recommend semi-annual imaging followup by CTA or MRA and referral to cardiothoracic surgery if not already obtained. This recommendation follows 2010 ACCF/AHA/AATS/ACR/ASA/SCA/SCAI/SIR/STS/SVM Guidelines for the Diagnosis and Management of Patients With Thoracic Aortic Disease. Circulation. 2010; 121: R604-V409.  Aortic aneurysm NOS (ICD10-I71.9) 3. Infiltration or atelectasis in both lung bases. 4. Poorly defined gallbladder wall suggesting pericholecystic edema. This could indicate cholecystitis in the appropriate clinical setting. Consider ultrasound for further evaluation. 5. Aortic atherosclerosis. Electronically Signed   By: Boyce Byes M.D.   On: 02/12/2024 23:06    EKG: afib rate 137 no acute changes    ASSESSMENT AND PLAN:   Preoperative:  Normal EF and can achieve > 5 METS No history of CAD. Clear to have GB surgery under general anesthesia Afib: new onset April On heparin eliquis  being held Can hold heparin 2 hours before surgery and resume eliquis  post op when ok with general surgery. Increased oral lopressor  to 50 mg q 8 hours  continue iv cardizem both for rate control.  GB:  WBC normal pain a bit improved HIDA pending US  consistent with acute cholecystitis TB elevated fraction pending  Aortic aneurysm:  4.3 by TTE 4.7 by CTA f/u CTA in 6 months   Signed: Janelle Mediate 02/13/2024, 11:40 AM

## 2024-02-13 NOTE — Progress Notes (Signed)
 Subjective: Still with RUQ abdominal pain today.  Voided several times overnight.  No other new complaints  ROS: See above, otherwise other systems negative  Objective: Vital signs in last 24 hours: Temp:  [97.9 F (36.6 C)-98.4 F (36.9 C)] 97.9 F (36.6 C) (04/29 0750) Pulse Rate:  [56-150] 128 (04/29 0600) Resp:  [14-30] 28 (04/29 0600) BP: (128-165)/(83-112) 145/95 (04/29 0600) SpO2:  [95 %-100 %] 96 % (04/29 0600) Weight:  [86.2 kg] 86.2 kg (04/28 2122)    Intake/Output from previous day: 04/28 0701 - 04/29 0700 In: 2049 [IV Piggyback:2049] Out: -  Intake/Output this shift: No intake/output data recorded.  PE: Gen: NAD Heart: irregular, tachy between 120-140s Lungs: CTAB Abd: soft, ND, tender in RUQ over gb, +BS  Lab Results:  Recent Labs    02/12/24 2128 02/13/24 0543  WBC 8.9 8.8  HGB 16.9 16.5  HCT 50.4 47.9  PLT 174 182   BMET Recent Labs    02/12/24 2128 02/13/24 0543  NA 138 136  K 3.9 3.7  CL 102 104  CO2 24 23  GLUCOSE 162* 114*  BUN 22 14  CREATININE 1.23 1.03  CALCIUM 8.9 8.4*   PT/INR No results for input(s): "LABPROT", "INR" in the last 72 hours. CMP     Component Value Date/Time   NA 136 02/13/2024 0543   NA 140 01/25/2024 1120   K 3.7 02/13/2024 0543   CL 104 02/13/2024 0543   CO2 23 02/13/2024 0543   GLUCOSE 114 (H) 02/13/2024 0543   BUN 14 02/13/2024 0543   BUN 29 (H) 01/25/2024 1120   CREATININE 1.03 02/13/2024 0543   CALCIUM 8.4 (L) 02/13/2024 0543   PROT 5.9 (L) 02/13/2024 0543   PROT 6.3 01/25/2024 1120   ALBUMIN 2.9 (L) 02/13/2024 0543   ALBUMIN 3.9 01/25/2024 1120   AST 17 02/13/2024 0543   ALT 12 02/13/2024 0543   ALKPHOS 43 02/13/2024 0543   BILITOT 3.4 (H) 02/13/2024 0543   BILITOT 1.4 (H) 01/25/2024 1120   GFRNONAA >60 02/13/2024 0543   GFRAA 71 09/18/2020 1441   Lipase     Component Value Date/Time   LIPASE 22 07/16/2023 0222       Studies/Results: US  Abdomen Limited RUQ  (LIVER/GB) Result Date: 02/12/2024 CLINICAL DATA:  Abdominal pain EXAM: ULTRASOUND ABDOMEN LIMITED RIGHT UPPER QUADRANT COMPARISON:  CT chest 02/12/2024 and CT abdomen pelvis 07/16/2023 FINDINGS: Gallbladder: Sludge is present within the gallbladder. There is marked wall thickening measuring 10 mm in thickness. A positive sonographic Abigail Abler sign was noted by the sonographer. Common bile duct: Diameter: Not visualized. Liver: No focal lesion identified. Within normal limits in parenchymal echogenicity. Portal vein is patent on color Doppler imaging with normal direction of blood flow towards the liver. Other: None. IMPRESSION: Findings consistent with acute cholecystitis. Electronically Signed   By: Rozell Cornet M.D.   On: 02/12/2024 23:32   CT Angio Chest Aorta w/CM &/OR wo/CM Result Date: 02/12/2024 CLINICAL DATA:  Acute aortic syndrome suspected.  Abdominal pain. EXAM: CT ANGIOGRAPHY CHEST WITH CONTRAST TECHNIQUE: Multidetector CT imaging of the chest was performed using the standard protocol during bolus administration of intravenous contrast. Multiplanar CT image reconstructions and MIPs were obtained to evaluate the vascular anatomy. RADIATION DOSE REDUCTION: This exam was performed according to the departmental dose-optimization program which includes automated exposure control, adjustment of the mA and/or kV according to patient size and/or use of iterative reconstruction technique. CONTRAST:  75mL OMNIPAQUE  IOHEXOL  350  MG/ML SOLN COMPARISON:  09/27/2021 FINDINGS: Cardiovascular: Images are obtained during the pulmonary arterial phase after contrast material administration. There is good opacification of the central and segmental pulmonary arteries. No focal filling defects. No evidence of significant pulmonary embolus. Poor opacification of the aorta during this phase limits evaluation of the aorta 4 dissection or other intrinsic aortic abnormality. There is a ascending thoracic aortic aneurysm  measuring 4.7 cm in diameter. Calcification of the aorta and coronary arteries. Normal heart size. No pericardial effusions. Mediastinum/Nodes: No enlarged mediastinal, hilar, or axillary lymph nodes. Thyroid  gland, trachea, and esophagus demonstrate no significant findings. Lungs/Pleura: Motion artifact limits examination. There is evidence of infiltration or atelectasis in both lung bases. No pleural effusion or pneumothorax. Upper Abdomen: Poor definition of the gallbladder wall suggest pericholecystic edema, possibly due to cholecystitis. No stones are demonstrated. Bilateral renal cysts are partially visualized. These are better demonstrated on prior CT abdomen and pelvis from 07/16/2023. No imaging follow-up is indicated. Musculoskeletal: Degenerative changes in the spine. No acute bony abnormalities. Review of the MIP images confirms the above findings. IMPRESSION: 1. Images were obtained during the pulmonary arterial phase. No evidence of significant pulmonary embolus. 2. Ascending aortic aneurysm measuring 4.7 cm diameter. Ascending thoracic aortic aneurysm. Recommend semi-annual imaging followup by CTA or MRA and referral to cardiothoracic surgery if not already obtained. This recommendation follows 2010 ACCF/AHA/AATS/ACR/ASA/SCA/SCAI/SIR/STS/SVM Guidelines for the Diagnosis and Management of Patients With Thoracic Aortic Disease. Circulation. 2010; 121: W119-J478. Aortic aneurysm NOS (ICD10-I71.9) 3. Infiltration or atelectasis in both lung bases. 4. Poorly defined gallbladder wall suggesting pericholecystic edema. This could indicate cholecystitis in the appropriate clinical setting. Consider ultrasound for further evaluation. 5. Aortic atherosclerosis. Electronically Signed   By: Boyce Byes M.D.   On: 02/12/2024 23:06    Anti-infectives: Anti-infectives (From admission, onward)    Start     Dose/Rate Route Frequency Ordered Stop   02/13/24 0600  piperacillin-tazobactam (ZOSYN) IVPB 3.375 g         3.375 g 12.5 mL/hr over 240 Minutes Intravenous Every 8 hours 02/12/24 2358     02/12/24 2345  piperacillin-tazobactam (ZOSYN) IVPB 3.375 g        3.375 g 100 mL/hr over 30 Minutes Intravenous  Once 02/12/24 2334 02/13/24 0013        Assessment/Plan Acalculous cholecystitis with sludge -last took Eliquis  Monday am.  Cont to hold and wait for this to wear off -HIDA scan ordered.  TB continues to rise in the setting of only sludge and no stones.  Will fractionate the TB, but also HIDA may allow us  to see if contrast passes into the small bowel to eval for CBD obstruction -continue heparin gtt.  Will need to hold in am for OR tomorrow -would likely benefit from cards eval for risk stratification prior to surgery.  Last echo in Jan appears stable.  -cont abx therapy, WBC normal though at 8  FEN - NPO right now for HIDA, NPO p MN VTE - heparin gtt ID - [email or web address]  A fib - eliquis  on hold, currently on dilt gtt.   HTN Aortic dilatation Aortic regurg Gout  I reviewed Consultant outpatient cardiology notes, hospitalist notes, last 24 h vitals and pain scores, last 48 h intake and output, last 24 h labs and trends, and last 24 h imaging results.   LOS: 0 days    Leone Ralphs , [facility] 02/13/2024, 8:06 AM Please see Amion for pager number during day hours 7:00am-4:30pm or  7:00am -11:30am on weekends

## 2024-02-13 NOTE — Progress Notes (Signed)
 PHARMACY - ANTICOAGULATION CONSULT NOTE  Pharmacy Consult for IV heparin Indication: atrial fibrillation  Patient Measurements: Height: 6\' 1"  (185.4 cm) Weight: 86.2 kg (190 lb) IBW/kg (Calculated) : 79.9 HEPARIN DW (KG): 86.2  Labs: Recent Labs    02/12/24 2128  HGB 16.9  HCT 50.4  PLT 174  CREATININE 1.23  TROPONINIHS 10  Estimated Creatinine Clearance: 51.4 mL/min (by C-G formula based on SCr of 1.23 mg/dL).  Medical History: Past Medical History:  Diagnosis Date   Gout    Hypertension    Shingles 07/20/2016   Assessment: Jose Hood is a 83 y.o. year old male admitted on 02/12/2024 with concern for abdominal pain with concern for intra-abdominal infection requiring surgery intervention. Eliquis  prior to admission for afib (last dose 02/11/24 PM). Pharmacy consulted to dose heparin.  Goal of Therapy:  aPTT 66-102 seconds Monitor platelets by anticoagulation protocol: Yes   Plan:  Heparin infusion at 1200 units/hr (no bolus) 8 aPTT  Daily aPTT, heparin level, CBC, and monitoring for bleeding F/u plans for anticoagulation and surgery   Thank you for allowing pharmacy to participate in this patient's care.  Fonda Hymen, PharmD Emergency Medicine Clinical Pharmacist 02/13/2024,2:03 AM

## 2024-02-13 NOTE — ED Notes (Signed)
 Compatibility of Heparin and diltiazem drip confirmed with pharmacist tony.

## 2024-02-13 NOTE — Evaluation (Signed)
 Physical Therapy Evaluation Patient Details Name: Jose Hood MRN: [id number] DOB: 12-12-1940 Today's Date: 02/13/2024  History of Present Illness  Patient is an 83 y/o male admitted 02/12/24 with abdominal pain, N&V and increased urination.  Found to be in A-fib w/RVR with CT notable for potential acute cholecystitis.  PMH positive for gout, HTN, and A-fib on Eliquis .  Clinical Impression  Patient able to mobilize in his room though limited by abdominal pain.   He was previously independent, out working fixing his deck last week.  Recently retired from driving tour bus.  Patient currently supervision for in room mobility though plans for possible procedure for gall bladder issues.  PT will continue to follow along for potential for HHPT/aide as pt reports his wife cannot assist him at d/c.       If plan is discharge home, recommend the following: A little help with walking and/or transfers;Help with stairs or ramp for entrance;A little help with bathing/dressing/bathroom;Assistance with cooking/housework;Assist for transportation   Can travel by private vehicle        Equipment Recommendations None recommended by PT  Recommendations for Other Services       Functional Status Assessment Patient has had a recent decline in their functional status and demonstrates the ability to make significant improvements in function in a reasonable and predictable amount of time.     Precautions / Restrictions Precautions Precautions: Fall Recall of Precautions/Restrictions: Intact      Mobility  Bed Mobility Overal bed mobility: Needs Assistance Bed Mobility: Supine to Sit, Sit to Supine     Supine to sit: Supervision, Contact guard Sit to supine: Supervision   General bed mobility comments: assist for lines and some lifting help due to abdominal pain    Transfers Overall transfer level: Needs assistance Equipment used: None Transfers: Sit to/from Stand Sit to Stand: Contact guard  assist           General transfer comment: for balance    Ambulation/Gait Ambulation/Gait assistance: Contact guard assist, Supervision Gait Distance (Feet): 20 Feet Assistive device: None Gait Pattern/deviations: Step-through pattern, Decreased stride length, Trunk flexed       General Gait Details: in room only with pain in abdomen, though able to walk to bathroom, wash hands then back to EOB; stood at doorway of bathroom while PT changed linen on his bed  Stairs            Wheelchair Mobility     Tilt Bed    Modified Rankin (Stroke Patients Only)       Balance Overall balance assessment: Needs assistance   Sitting balance-Leahy Scale: Good       Standing balance-Leahy Scale: Good Standing balance comment: mild deficits with weakness from illness and not able to eat                             Pertinent Vitals/Pain Pain Assessment Pain Assessment: Faces Faces Pain Scale: Hurts whole lot Pain Location: none at rest, but hurts on R side when turning in bed Pain Descriptors / Indicators: Sharp Pain Intervention(s): Monitored during session    Home Living Family/patient expects to be discharged to:: Private residence Living Arrangements: Spouse/significant other   Type of Home: House Home Access: Level entry       Home Layout: One level Home Equipment: Shower seat;Grab bars - tub/shower;Cane - single Librarian, academic (2 wheels);BSC/3in1      Prior Function Prior Level of Function :  Independent/Modified Independent             Mobility Comments: usually drives and just finished fixing his deck last week, just retired from [employer] driving       Extremity/Trunk Assessment   Upper Extremity Assessment Upper Extremity Assessment: Overall WFL for tasks assessed    Lower Extremity Assessment Lower Extremity Assessment: Overall WFL for tasks assessed       Communication   Communication Communication:  Impaired Factors Affecting Communication: Hearing impaired    Cognition Arousal: Alert Behavior During Therapy: WFL for tasks assessed/performed   PT - Cognitive impairments: No apparent impairments                         Following commands: Intact       Cueing       General Comments General comments (skin integrity, edema, etc.): VSS with mobility on RA    Exercises     Assessment/Plan    PT Assessment Patient needs continued PT services  PT Problem List Decreased activity tolerance;Pain;Decreased mobility;Decreased strength       PT Treatment Interventions Gait training;Balance training;Therapeutic exercise;Functional mobility training;Therapeutic activities;Patient/family education;Stair training    PT Goals (Current goals can be found in the Care Plan section)  Acute Rehab PT Goals Patient Stated Goal: to return to independent PT Goal Formulation: With patient Time For Goal Achievement: 02/27/24 Potential to Achieve Goals: Good    Frequency Min 2X/week     Co-evaluation               AM-PAC PT "6 Clicks" Mobility  Outcome Measure Help needed turning from your back to your side while in a flat bed without using bedrails?: A Little Help needed moving from lying on your back to sitting on the side of a flat bed without using bedrails?: A Little Help needed moving to and from a bed to a chair (including a wheelchair)?: A Little Help needed standing up from a chair using your arms (e.g., wheelchair or bedside chair)?: A Little Help needed to walk in hospital room?: A Little Help needed climbing 3-5 steps with a railing? : Total 6 Click Score: 16    End of Session Equipment Utilized During Treatment: Gait belt Activity Tolerance: Patient limited by pain Patient left: in bed;with call bell/phone within reach   PT Visit Diagnosis: Difficulty in walking, not elsewhere classified (R26.2)    Time: 6301-6010 PT Time Calculation (min) (ACUTE  ONLY): 22 min   Charges:   PT Evaluation $PT Eval Low Complexity: 1 Low   PT General Charges $$ ACUTE PT VISIT: 1 Visit         Abigail Hoff, PT Acute Rehabilitation Services [phone] 02/13/2024   [name] 02/13/2024, 1:44 PM

## 2024-02-13 NOTE — Progress Notes (Signed)
 Courtesy visit- No billing:  Patient is seen and examined today morning. He is admitted overnight for acute cholecystitis. He is awaiting HIDA and then likely surgery during this hospitalization.   He has Afib with RVR, currently on Cardizem drip. Discussed with surgery team who asked for Cardiology pre- op clearance. Cardiology consulted. Continue IV Zosyn, IV hydration. Hold Eliquis  for OR. Further management as per clinical course.

## 2024-02-14 DIAGNOSIS — I482 Chronic atrial fibrillation, unspecified: Secondary | ICD-10-CM

## 2024-02-14 DIAGNOSIS — Z5181 Encounter for therapeutic drug level monitoring: Secondary | ICD-10-CM | POA: Diagnosis not present

## 2024-02-14 DIAGNOSIS — Z7901 Long term (current) use of anticoagulants: Secondary | ICD-10-CM | POA: Diagnosis not present

## 2024-02-14 DIAGNOSIS — K810 Acute cholecystitis: Secondary | ICD-10-CM | POA: Diagnosis not present

## 2024-02-14 LAB — CBC
HCT: 45.1 % (ref 39.0–52.0)
Hemoglobin: 15.3 g/dL (ref 13.0–17.0)
MCH: 29.9 pg (ref 26.0–34.0)
MCHC: 33.9 g/dL (ref 30.0–36.0)
MCV: 88.3 fL (ref 80.0–100.0)
Platelets: 184 10*3/uL (ref 150–400)
RBC: 5.11 MIL/uL (ref 4.22–5.81)
RDW: 13.9 % (ref 11.5–15.5)
WBC: 7 10*3/uL (ref 4.0–10.5)
nRBC: 0 % (ref 0.0–0.2)

## 2024-02-14 LAB — COMPREHENSIVE METABOLIC PANEL WITH GFR
ALT: 11 U/L (ref 0–44)
AST: 16 U/L (ref 15–41)
Albumin: 2.6 g/dL — ABNORMAL LOW (ref 3.5–5.0)
Alkaline Phosphatase: 49 U/L (ref 38–126)
Anion gap: 13 (ref 5–15)
BUN: 14 mg/dL (ref 8–23)
CO2: 23 mmol/L (ref 22–32)
Calcium: 8.6 mg/dL — ABNORMAL LOW (ref 8.9–10.3)
Chloride: 103 mmol/L (ref 98–111)
Creatinine, Ser: 1.11 mg/dL (ref 0.61–1.24)
GFR, Estimated: >60 mL/min (ref >60)
Glucose, Bld: 97 mg/dL (ref 70–99)
Potassium: 3.7 mmol/L (ref 3.5–5.1)
Sodium: 139 mmol/L (ref 135–145)
Total Bilirubin: 2.6 mg/dL — ABNORMAL HIGH (ref 0.0–1.2)
Total Protein: 5.6 g/dL — ABNORMAL LOW (ref 6.5–8.1)

## 2024-02-14 LAB — HEPARIN LEVEL (UNFRACTIONATED): Heparin Unfractionated: 0.69 [IU]/mL (ref 0.30–0.70)

## 2024-02-14 LAB — APTT: aPTT: 99 s — ABNORMAL HIGH (ref 24–36)

## 2024-02-14 MED ORDER — MORPHINE SULFATE (PF) 2 MG/ML IV SOLN
2.0000 mg | INTRAVENOUS | Status: DC | PRN
  Administered 2024-02-15: 2 mg via INTRAVENOUS
  Filled 2024-02-14: qty 1

## 2024-02-14 MED ORDER — ACETAMINOPHEN 325 MG PO TABS: 650.0000 mg | ORAL_TABLET | Freq: Four times a day (QID) | ORAL | Status: DC | PRN

## 2024-02-14 MED ORDER — SODIUM CHLORIDE 0.9 % IV SOLN: INTRAVENOUS | Status: DC

## 2024-02-14 MED ORDER — LACTATED RINGERS IV SOLN: INTRAVENOUS | Status: DC

## 2024-02-14 MED ORDER — METOPROLOL TARTRATE 50 MG PO TABS
50.0000 mg | ORAL_TABLET | Freq: Three times a day (TID) | ORAL | Status: DC
  Administered 2024-02-14 – 2024-02-17 (×9): 50 mg via ORAL
  Filled 2024-02-14 (×8): qty 1
  Filled 2024-02-14: qty 2

## 2024-02-14 NOTE — Progress Notes (Signed)
 Subjective: Having mild RUQ worse with deep inspiration and palpation. Overall better since admission. Has been NPO  ROS: See above, otherwise other systems negative  Objective: Vital signs in last 24 hours: Temp:  [97.6 F (36.4 C)-98.5 F (36.9 C)] 98 F (36.7 C) (04/30 0726) Pulse Rate:  [92-111] 96 (04/30 0726) Resp:  [18-20] 19 (04/30 0726) BP: (111-149)/(69-93) 129/82 (04/30 0726) SpO2:  [94 %-100 %] 94 % (04/30 0726) Weight:  [85.7 kg] 85.7 kg (04/29 1246)    Intake/Output from previous day: 04/29 0701 - 04/30 0700 In: -  Out: 900 [Urine:900] Intake/Output this shift: Total I/O In: -  Out: 200 [Urine:200]  PE: Gen: NAD Heart: irregular, tachy between 120-140s Lungs: respiratory effort nonlabored on room air Abd: soft, ND, tender in RUQ over gb  Lab Results:  Recent Labs    02/13/24 0543 02/14/24 0455  WBC 8.8 7.0  HGB 16.5 15.3  HCT 47.9 45.1  PLT 182 184   BMET Recent Labs    02/13/24 0543 02/14/24 0455  NA 136 139  K 3.7 3.7  CL 104 103  CO2 23 23  GLUCOSE 114* 97  BUN 14 14  CREATININE 1.03 1.11  CALCIUM 8.4* 8.6*   PT/INR No results for input(s): "LABPROT", "INR" in the last 72 hours. CMP     Component Value Date/Time   NA 139 02/14/2024 0455   NA 140 01/25/2024 1120   K 3.7 02/14/2024 0455   CL 103 02/14/2024 0455   CO2 23 02/14/2024 0455   GLUCOSE 97 02/14/2024 0455   BUN 14 02/14/2024 0455   BUN 29 (H) 01/25/2024 1120   CREATININE 1.11 02/14/2024 0455   CALCIUM 8.6 (L) 02/14/2024 0455   PROT 5.6 (L) 02/14/2024 0455   PROT 6.3 01/25/2024 1120   ALBUMIN 2.6 (L) 02/14/2024 0455   ALBUMIN 3.9 01/25/2024 1120   AST 16 02/14/2024 0455   ALT 11 02/14/2024 0455   ALKPHOS 49 02/14/2024 0455   BILITOT 2.6 (H) 02/14/2024 0455   BILITOT 1.4 (H) 01/25/2024 1120   GFRNONAA >60 02/14/2024 0455   GFRAA 71 09/18/2020 1441   Lipase     Component Value Date/Time   LIPASE 22 07/16/2023 0222       Studies/Results: NM  Hepatobiliary Liver Func Result Date: 02/13/2024 CLINICAL DATA:  Right upper quadrant abdominal pain, nondiagnostic ultrasound, concern for acalculous cholecystitis EXAM: NUCLEAR MEDICINE HEPATOBILIARY IMAGING TECHNIQUE: Sequential images of the abdomen were obtained out to 60 minutes following intravenous administration of radiopharmaceutical. RADIOPHARMACEUTICALS:  5.2 mCi Tc-5m Choletec IV, 3 mg of morphine IV COMPARISON:  Ultrasound 02/12/2024 FINDINGS: There is prompt uptake of radiotracer by the hepatic parenchyma. Normal excretion is seen through the common bile duct into the small bowel. The gallbladder was not visualized by 60 minutes. Morphine was then administered per protocol, with continued failure to visualize the gallbladder. IMPRESSION: 1. Failure to visualize the gallbladder despite morphine administration, consistent with cystic duct obstruction and cholecystitis. 2. No evidence of common bile duct obstruction, with normal excretion of radiotracer into the bowel. Electronically Signed   By: Bobbye Burrow M.D.   On: 02/13/2024 17:41   US  Abdomen Limited RUQ (LIVER/GB) Result Date: 02/12/2024 CLINICAL DATA:  Abdominal pain EXAM: ULTRASOUND ABDOMEN LIMITED RIGHT UPPER QUADRANT COMPARISON:  CT chest 02/12/2024 and CT abdomen pelvis 07/16/2023 FINDINGS: Gallbladder: Sludge is present within the gallbladder. There is marked wall thickening measuring 10 mm in thickness. A positive sonographic Abigail Abler sign was noted  by the sonographer. Common bile duct: Diameter: Not visualized. Liver: No focal lesion identified. Within normal limits in parenchymal echogenicity. Portal vein is patent on color Doppler imaging with normal direction of blood flow towards the liver. Other: None. IMPRESSION: Findings consistent with acute cholecystitis. Electronically Signed   By: Rozell Cornet M.D.   On: 02/12/2024 23:32   CT Angio Chest Aorta w/CM &/OR wo/CM Result Date: 02/12/2024 CLINICAL DATA:  Acute aortic  syndrome suspected.  Abdominal pain. EXAM: CT ANGIOGRAPHY CHEST WITH CONTRAST TECHNIQUE: Multidetector CT imaging of the chest was performed using the standard protocol during bolus administration of intravenous contrast. Multiplanar CT image reconstructions and MIPs were obtained to evaluate the vascular anatomy. RADIATION DOSE REDUCTION: This exam was performed according to the departmental dose-optimization program which includes automated exposure control, adjustment of the mA and/or kV according to patient size and/or use of iterative reconstruction technique. CONTRAST:  75mL OMNIPAQUE  IOHEXOL  350 MG/ML SOLN COMPARISON:  09/27/2021 FINDINGS: Cardiovascular: Images are obtained during the pulmonary arterial phase after contrast material administration. There is good opacification of the central and segmental pulmonary arteries. No focal filling defects. No evidence of significant pulmonary embolus. Poor opacification of the aorta during this phase limits evaluation of the aorta 4 dissection or other intrinsic aortic abnormality. There is a ascending thoracic aortic aneurysm measuring 4.7 cm in diameter. Calcification of the aorta and coronary arteries. Normal heart size. No pericardial effusions. Mediastinum/Nodes: No enlarged mediastinal, hilar, or axillary lymph nodes. Thyroid  gland, trachea, and esophagus demonstrate no significant findings. Lungs/Pleura: Motion artifact limits examination. There is evidence of infiltration or atelectasis in both lung bases. No pleural effusion or pneumothorax. Upper Abdomen: Poor definition of the gallbladder wall suggest pericholecystic edema, possibly due to cholecystitis. No stones are demonstrated. Bilateral renal cysts are partially visualized. These are better demonstrated on prior CT abdomen and pelvis from 07/16/2023. No imaging follow-up is indicated. Musculoskeletal: Degenerative changes in the spine. No acute bony abnormalities. Review of the MIP images confirms  the above findings. IMPRESSION: 1. Images were obtained during the pulmonary arterial phase. No evidence of significant pulmonary embolus. 2. Ascending aortic aneurysm measuring 4.7 cm diameter. Ascending thoracic aortic aneurysm. Recommend semi-annual imaging followup by CTA or MRA and referral to cardiothoracic surgery if not already obtained. This recommendation follows 2010 ACCF/AHA/AATS/ACR/ASA/SCA/SCAI/SIR/STS/SVM Guidelines for the Diagnosis and Management of Patients With Thoracic Aortic Disease. Circulation. 2010; 121: Z610-R604. Aortic aneurysm NOS (ICD10-I71.9) 3. Infiltration or atelectasis in both lung bases. 4. Poorly defined gallbladder wall suggesting pericholecystic edema. This could indicate cholecystitis in the appropriate clinical setting. Consider ultrasound for further evaluation. 5. Aortic atherosclerosis. Electronically Signed   By: Boyce Byes M.D.   On: 02/12/2024 23:06    Anti-infectives: Anti-infectives (From admission, onward)    Start     Dose/Rate Route Frequency Ordered Stop   02/13/24 0600  piperacillin-tazobactam (ZOSYN) IVPB 3.375 g        3.375 g 12.5 mL/hr over 240 Minutes Intravenous Every 8 hours 02/12/24 2358     02/12/24 2345  piperacillin-tazobactam (ZOSYN) IVPB 3.375 g        3.375 g 100 mL/hr over 30 Minutes Intravenous  Once 02/12/24 2334 02/13/24 0013        Assessment/Plan Acalculous cholecystitis with sludge -last took Eliquis  Monday am.  Cont to hold and wait for this to wear off. On Hep gtt this am - will hold preop -HIDA scan positive for cystic duct obstruction. No evidence of CBD obstruction. T bili 2.6 (3.3) -  cards has cleared for OR. Appreciate their evaluation -cont abx therapy, WBC normal though at 8  FEN - NPO for OR VTE - heparin gtt ID - zosyn  A fib - eliquis  on hold, currently on dilt gtt.   HTN Aortic dilatation Aortic regurg Gout  I reviewed Consultant cardiology notes, hospitalist notes, last 24 h vitals and pain  scores, last 48 h intake and output, last 24 h labs and trends, and last 24 h imaging results.   LOS: 1 day    Jose Hood , [facility] 02/14/2024, 9:06 AM Please see Amion for pager number during day hours 7:00am-4:30pm or 7:00am -11:30am on weekends

## 2024-02-14 NOTE — Plan of Care (Signed)
   Problem: Education: Goal: Knowledge of General Education information will improve Description: Including pain rating scale, medication(s)/side effects and non-pharmacologic comfort measures Outcome: Progressing   Problem: Clinical Measurements: Goal: Ability to maintain clinical measurements within normal limits will improve Outcome: Progressing

## 2024-02-14 NOTE — TOC Initial Note (Signed)
 Transition of Care [facility]) - Initial/Assessment Note    Patient Details  Name: Jose Hood MRN: [id number] Date of Birth: 1941/06/24  Transition of [facility]) CM/SW Contact:    Cosimo Diones, RN Phone Number: 02/14/2024, 12:08 PM  Clinical Narrative: Patient presented for abdominal pain. PTA patient was from home with spouse. Spouse was at the bedside during the visit. Patient has DME walker and he is agreeable to home health services. Patient has no agency preference and is agreeable with referral to [facility]. Referral submitted to [facility] and start of care to begin within 24-48 hours post transition home. No further home needs identified at this time. Case Manager will continue to follow for additional transition of care needs.                 Expected Discharge Plan: Home w Home Health Services Barriers to Discharge: No Barriers Identified   Patient Goals and CMS Choice Patient states their goals for this hospitalization and ongoing recovery are:: Plan for transition home once stable.   Choice offered to / list presented to : Patient (Patient did not have a preference.)      Expected Discharge Plan and Services In-house Referral: NA Discharge Planning Services: CM Consult Post Acute Care Choice: Home Health Living arrangements for the past 2 months: Single Family Home   HH Arranged: PT HH Agency: [facility] Date [facility] Agency Contacted: 02/14/24 Time HH Agency Contacted: 1206 Representative spoke with at [facility] Agency: Loetta Ringer  Prior Living Arrangements/Services Living arrangements for the past 2 months: Single Family Home Lives with:: Spouse, Significant Other Patient language and need for interpreter reviewed:: Yes Do you feel safe going back to the place where you live?: Yes      Need for Family Participation in Patient Care: Yes (Comment) Care giver support system in place?: Yes (comment) Current home services: DME (has walker without  wheels.) Criminal Activity/Legal Involvement Pertinent to Current Situation/Hospitalization: No - Comment as needed  Activities of Daily Living   ADL Screening (condition at time of admission) Independently performs ADLs?: Yes (appropriate for developmental age) Is the patient deaf or have difficulty hearing?: No Does the patient have difficulty seeing, even when wearing glasses/contacts?: No Does the patient have difficulty concentrating, remembering, or making decisions?: No  Permission Sought/Granted Permission sought to share information with : Family Supports, Case Manager Permission granted to share information with : Yes, Verbal Permission Granted     Permission granted to share info w AGENCY: [facility]     Emotional Assessment Appearance:: Appears stated age Attitude/Demeanor/Rapport: Engaged Affect (typically observed): Appropriate Orientation: : Oriented to Self, Oriented to Place, Oriented to  Time, Oriented to Situation Alcohol / Substance Use: Not Applicable Psych Involvement: No (comment)  Admission diagnosis:  Acute cholecystitis [K81.0] Longstanding persistent atrial fibrillation (HCC) [I48.11] Patient Active Problem List   Diagnosis Date Noted   Paroxysmal atrial fibrillation (HCC) 02/13/2024   Acute cholecystitis 02/13/2024   COLITIS 09/28/2010   OTHER ACUTE OTITIS EXTERNA 10/23/2007   Diverticulosis of colon 09/21/2007   HYPERCHOLESTEROLEMIA, PURE 03/26/2007   Dyslipidemia 03/26/2007   GOUT 03/26/2007   Essential hypertension 03/26/2007   Osteoarthritis 03/26/2007   History of colonic polyps 03/26/2007   PCP:  Ronna Coho, MD Pharmacy:   [facility]) - [place], [place] - [address] [place] [address] [place] [address] Phone: [phone] Fax: [phone]  [id number] - [place], [place] - [address] [place] [address] Phone: [phone] Fax:  [phone]  Social Drivers of Health (SDOH) Social History: SDOH Screenings   Food Insecurity: No Food Insecurity (02/13/2024)  Housing: Low Risk  (02/13/2024)  Transportation Needs: No Transportation Needs (02/13/2024)  Utilities: Not At Risk (02/13/2024)  Social Connections: Moderately Integrated (02/13/2024)  Tobacco Use: Low Risk  (02/13/2024)    Readmission Risk Interventions     No data to display

## 2024-02-14 NOTE — Progress Notes (Addendum)
 PHARMACY - ANTICOAGULATION CONSULT NOTE  Pharmacy Consult for IV heparin Indication: atrial fibrillation  Patient Measurements: Height: 6\' 1"  (185.4 cm) Weight: 85.7 kg (188 lb 14.4 oz) IBW/kg (Calculated) : 79.9 HEPARIN DW (KG): 85.7  Labs: Recent Labs    02/12/24 2128 02/13/24 0543 02/13/24 1036 02/13/24 2105 02/14/24 0455  HGB 16.9 16.5  --   --  15.3  HCT 50.4 47.9  --   --  45.1  PLT 174 182  --   --  184  APTT  --   --  49* 83* 99*  HEPARINUNFRC  --   --  0.68  --  0.69  CREATININE 1.23 1.03  --   --  1.11  TROPONINIHS 10  --   --   --   --   Estimated Creatinine Clearance: 57 mL/min (by C-G formula based on SCr of 1.11 mg/dL).  Medical History: Past Medical History:  Diagnosis Date   Gout    Hypertension    Shingles 07/20/2016   Assessment: Jose Hood is a 83 y.o. year old male admitted on 02/12/2024 with concern for abdominal pain with concern for intra-abdominal infection requiring surgery intervention. Eliquis  prior to admission for afib (last dose 02/11/24 PM). Pharmacy consulted to dose heparin.  -aPTT= 99 and at goal on heparin 1350 units/hr, CBC stable -Heparin level 0.69, elevation possibly due to recent apixaban  --plans noted for cholecystectomy on 5/1  Goal of Therapy:  aPTT 66-102 seconds Monitor platelets by anticoagulation protocol: Yes   Plan:  Continue heparin infusion at 1350 units/hr Plans for surgery on 5/1, heparin to stop at 4:30am  [name], PharmD Clinical Pharmacist **Pharmacist phone directory can now be found on [email or web address] ([email or web address]).  Listed under [facility].

## 2024-02-14 NOTE — Progress Notes (Signed)
 [facility] Progress Note Patient: Jose Hood VWU:[id number] DOB: 06-Sep-1941 DOA: 02/12/2024  DOS: the patient was seen and examined on 02/14/2024  Brief Hospital Course: PMH of paroxysmal A-fib on Eliquis , HTN, gout presented to the hospital with complaints of abdominal pain and nausea and vomiting.  Found to have acute cholecystitis.  General surgery was consulted. Cardiology was consulted as well for preop clearance. Currently on IV heparin.  Scheduled for surgery on 5/1.  Assessment and Plan: Acute cholecystitis Presented with abdominal pain.  Ultrasound shows gallbladder wall thickening with sludge. General surgery consulted. HIDA scan is actually positive. Currently on IV antibiotic. Started on oral diet.  Surgery scheduled on 5/1. Monitor postop recovery. Cardiology cleared the patient for surgery.   Paroxysmal A-fib with RVR Heart rate uncontrolled, in the 150s, started on Cardizem drip,  Cardiology following. Management per cardiology.  On IV heparin.   Hypertension Resume home dose metoprolol  On Cardizem.   BPH Continue with Flomax  and Proscar    Gout Continue with probenecid    Ascending aortic aneurysm measuring 4.7 cm diameter.   Cardiology recommend semi-annual imaging followup by CTA or MRA and referral to cardiothoracic surgery if not already obtained.    Subjective: No nausea no vomiting.  Some right upper quadrant abdominal pain.  No fever no chills.  Physical Exam: General: in Mild distress, No Rash Cardiovascular: S1 and S2 Present, No Murmur Respiratory: Good respiratory effort, Bilateral Air entry present. No Crackles, No wheezes Abdomen: Bowel Sound present, RUQ tenderness Extremities: No edema Neuro: Alert and oriented x3, no new focal deficit  Data Reviewed: I have Reviewed nursing notes, Vitals, and Lab results. Since last encounter, pertinent lab results CBC and BMP   . I have ordered test including CBC and BMP  . I have discussed pt's care  plan and test results with general surgery  .   Disposition: Status is: Inpatient Remains inpatient appropriate because: Monitor for improvement in rate control and postop recovery   Family Communication: Family at bedside Level of care: Progressive   Vitals:   02/14/24 0726 02/14/24 1149 02/14/24 1555 02/14/24 1956  BP: 129/82 (!) 139/90 (!) 109/96 130/78  Pulse: 96 (!) 101 72 (!) 102  Resp: 19 19 19 20   Temp: 98 F (36.7 C) 97.7 F (36.5 C) 98 F (36.7 C) 98.2 F (36.8 C)  TempSrc: Oral Oral Oral Oral  SpO2: 94% 95% 95% 97%  Weight:      Height:         Author: Charlean Congress, MD 02/14/2024 8:12 PM  Please look on [email or web address] to find out who is on call.

## 2024-02-14 NOTE — Anesthesia Preprocedure Evaluation (Addendum)
 Anesthesia Evaluation  Patient identified by MRN, date of birth, ID band Patient awake    Reviewed: Allergy & Precautions, NPO status , Patient's Chart, lab work & pertinent test results  History of Anesthesia Complications Negative for: history of anesthetic complications  Airway Mallampati: II  TM Distance: >3 FB Neck ROM: Full    Dental  (+) Missing,    Pulmonary neg pulmonary ROS   Pulmonary exam normal        Cardiovascular hypertension, Normal cardiovascular exam+ dysrhythmias Atrial Fibrillation      Neuro/Psych negative neurological ROS     GI/Hepatic Neg liver ROS,,,Acute cholecystitis   Endo/Other  negative endocrine ROS    Renal/GU negative Renal ROS     Musculoskeletal  (+) Arthritis ,    Abdominal   Peds  Hematology negative hematology ROS (+)   Anesthesia Other Findings Day of surgery medications reviewed with patient.  Reproductive/Obstetrics                              Anesthesia Physical Anesthesia Plan  ASA: 2  Anesthesia Plan: General   Post-op Pain Management: Tylenol  PO (pre-op)*   Induction: Intravenous  PONV Risk Score and Plan: 2 and Ondansetron , Dexamethasone  and Treatment may vary due to age or medical condition  Airway Management Planned: Oral ETT  Additional Equipment: None  Intra-op Plan:   Post-operative Plan: Extubation in OR  Informed Consent: I have reviewed the patients History and Physical, chart, labs and discussed the procedure including the risks, benefits and alternatives for the proposed anesthesia with the patient or authorized representative who has indicated his/her understanding and acceptance.     Dental advisory given  Plan Discussed with: CRNA  Anesthesia Plan Comments:         Anesthesia Quick Evaluation

## 2024-02-14 NOTE — H&P (View-Only) (Signed)
 Subjective: Having mild RUQ worse with deep inspiration and palpation. Overall better since admission. Has been NPO  ROS: See above, otherwise other systems negative  Objective: Vital signs in last 24 hours: Temp:  [97.6 F (36.4 C)-98.5 F (36.9 C)] 98 F (36.7 C) (04/30 0726) Pulse Rate:  [92-111] 96 (04/30 0726) Resp:  [18-20] 19 (04/30 0726) BP: (111-149)/(69-93) 129/82 (04/30 0726) SpO2:  [94 %-100 %] 94 % (04/30 0726) Weight:  [85.7 kg] 85.7 kg (04/29 1246)    Intake/Output from previous day: 04/29 0701 - 04/30 0700 In: -  Out: 900 [Urine:900] Intake/Output this shift: Total I/O In: -  Out: 200 [Urine:200]  PE: Gen: NAD Heart: irregular, tachy between 120-140s Lungs: respiratory effort nonlabored on room air Abd: soft, ND, tender in RUQ over gb  Lab Results:  Recent Labs    02/13/24 0543 02/14/24 0455  WBC 8.8 7.0  HGB 16.5 15.3  HCT 47.9 45.1  PLT 182 184   BMET Recent Labs    02/13/24 0543 02/14/24 0455  NA 136 139  K 3.7 3.7  CL 104 103  CO2 23 23  GLUCOSE 114* 97  BUN 14 14  CREATININE 1.03 1.11  CALCIUM 8.4* 8.6*   PT/INR No results for input(s): "LABPROT", "INR" in the last 72 hours. CMP     Component Value Date/Time   NA 139 02/14/2024 0455   NA 140 01/25/2024 1120   K 3.7 02/14/2024 0455   CL 103 02/14/2024 0455   CO2 23 02/14/2024 0455   GLUCOSE 97 02/14/2024 0455   BUN 14 02/14/2024 0455   BUN 29 (H) 01/25/2024 1120   CREATININE 1.11 02/14/2024 0455   CALCIUM 8.6 (L) 02/14/2024 0455   PROT 5.6 (L) 02/14/2024 0455   PROT 6.3 01/25/2024 1120   ALBUMIN 2.6 (L) 02/14/2024 0455   ALBUMIN 3.9 01/25/2024 1120   AST 16 02/14/2024 0455   ALT 11 02/14/2024 0455   ALKPHOS 49 02/14/2024 0455   BILITOT 2.6 (H) 02/14/2024 0455   BILITOT 1.4 (H) 01/25/2024 1120   GFRNONAA >60 02/14/2024 0455   GFRAA 71 09/18/2020 1441   Lipase     Component Value Date/Time   LIPASE 22 07/16/2023 0222       Studies/Results: NM  Hepatobiliary Liver Func Result Date: 02/13/2024 CLINICAL DATA:  Right upper quadrant abdominal pain, nondiagnostic ultrasound, concern for acalculous cholecystitis EXAM: NUCLEAR MEDICINE HEPATOBILIARY IMAGING TECHNIQUE: Sequential images of the abdomen were obtained out to 60 minutes following intravenous administration of radiopharmaceutical. RADIOPHARMACEUTICALS:  5.2 mCi Tc-5m Choletec IV, 3 mg of morphine IV COMPARISON:  Ultrasound 02/12/2024 FINDINGS: There is prompt uptake of radiotracer by the hepatic parenchyma. Normal excretion is seen through the common bile duct into the small bowel. The gallbladder was not visualized by 60 minutes. Morphine was then administered per protocol, with continued failure to visualize the gallbladder. IMPRESSION: 1. Failure to visualize the gallbladder despite morphine administration, consistent with cystic duct obstruction and cholecystitis. 2. No evidence of common bile duct obstruction, with normal excretion of radiotracer into the bowel. Electronically Signed   By: Bobbye Burrow M.D.   On: 02/13/2024 17:41   US  Abdomen Limited RUQ (LIVER/GB) Result Date: 02/12/2024 CLINICAL DATA:  Abdominal pain EXAM: ULTRASOUND ABDOMEN LIMITED RIGHT UPPER QUADRANT COMPARISON:  CT chest 02/12/2024 and CT abdomen pelvis 07/16/2023 FINDINGS: Gallbladder: Sludge is present within the gallbladder. There is marked wall thickening measuring 10 mm in thickness. A positive sonographic Abigail Abler sign was noted  by the sonographer. Common bile duct: Diameter: Not visualized. Liver: No focal lesion identified. Within normal limits in parenchymal echogenicity. Portal vein is patent on color Doppler imaging with normal direction of blood flow towards the liver. Other: None. IMPRESSION: Findings consistent with acute cholecystitis. Electronically Signed   By: Rozell Cornet M.D.   On: 02/12/2024 23:32   CT Angio Chest Aorta w/CM &/OR wo/CM Result Date: 02/12/2024 CLINICAL DATA:  Acute aortic  syndrome suspected.  Abdominal pain. EXAM: CT ANGIOGRAPHY CHEST WITH CONTRAST TECHNIQUE: Multidetector CT imaging of the chest was performed using the standard protocol during bolus administration of intravenous contrast. Multiplanar CT image reconstructions and MIPs were obtained to evaluate the vascular anatomy. RADIATION DOSE REDUCTION: This exam was performed according to the departmental dose-optimization program which includes automated exposure control, adjustment of the mA and/or kV according to patient size and/or use of iterative reconstruction technique. CONTRAST:  75mL OMNIPAQUE  IOHEXOL  350 MG/ML SOLN COMPARISON:  09/27/2021 FINDINGS: Cardiovascular: Images are obtained during the pulmonary arterial phase after contrast material administration. There is good opacification of the central and segmental pulmonary arteries. No focal filling defects. No evidence of significant pulmonary embolus. Poor opacification of the aorta during this phase limits evaluation of the aorta 4 dissection or other intrinsic aortic abnormality. There is a ascending thoracic aortic aneurysm measuring 4.7 cm in diameter. Calcification of the aorta and coronary arteries. Normal heart size. No pericardial effusions. Mediastinum/Nodes: No enlarged mediastinal, hilar, or axillary lymph nodes. Thyroid  gland, trachea, and esophagus demonstrate no significant findings. Lungs/Pleura: Motion artifact limits examination. There is evidence of infiltration or atelectasis in both lung bases. No pleural effusion or pneumothorax. Upper Abdomen: Poor definition of the gallbladder wall suggest pericholecystic edema, possibly due to cholecystitis. No stones are demonstrated. Bilateral renal cysts are partially visualized. These are better demonstrated on prior CT abdomen and pelvis from 07/16/2023. No imaging follow-up is indicated. Musculoskeletal: Degenerative changes in the spine. No acute bony abnormalities. Review of the MIP images confirms  the above findings. IMPRESSION: 1. Images were obtained during the pulmonary arterial phase. No evidence of significant pulmonary embolus. 2. Ascending aortic aneurysm measuring 4.7 cm diameter. Ascending thoracic aortic aneurysm. Recommend semi-annual imaging followup by CTA or MRA and referral to cardiothoracic surgery if not already obtained. This recommendation follows 2010 ACCF/AHA/AATS/ACR/ASA/SCA/SCAI/SIR/STS/SVM Guidelines for the Diagnosis and Management of Patients With Thoracic Aortic Disease. Circulation. 2010; 121: Z610-R604. Aortic aneurysm NOS (ICD10-I71.9) 3. Infiltration or atelectasis in both lung bases. 4. Poorly defined gallbladder wall suggesting pericholecystic edema. This could indicate cholecystitis in the appropriate clinical setting. Consider ultrasound for further evaluation. 5. Aortic atherosclerosis. Electronically Signed   By: Boyce Byes M.D.   On: 02/12/2024 23:06    Anti-infectives: Anti-infectives (From admission, onward)    Start     Dose/Rate Route Frequency Ordered Stop   02/13/24 0600  piperacillin-tazobactam (ZOSYN) IVPB 3.375 g        3.375 g 12.5 mL/hr over 240 Minutes Intravenous Every 8 hours 02/12/24 2358     02/12/24 2345  piperacillin-tazobactam (ZOSYN) IVPB 3.375 g        3.375 g 100 mL/hr over 30 Minutes Intravenous  Once 02/12/24 2334 02/13/24 0013        Assessment/Plan Acalculous cholecystitis with sludge -last took Eliquis  Monday am.  Cont to hold and wait for this to wear off. On Hep gtt this am - will hold preop -HIDA scan positive for cystic duct obstruction. No evidence of CBD obstruction. T bili 2.6 (3.3) -  cards has cleared for OR. Appreciate their evaluation -cont abx therapy, WBC normal though at 8  FEN - NPO for OR VTE - heparin gtt ID - zosyn  A fib - eliquis  on hold, currently on dilt gtt.   HTN Aortic dilatation Aortic regurg Gout  I reviewed Consultant cardiology notes, hospitalist notes, last 24 h vitals and pain  scores, last 48 h intake and output, last 24 h labs and trends, and last 24 h imaging results.   LOS: 1 day    Elwin Hammond , [facility] 02/14/2024, 9:06 AM Please see Amion for pager number during day hours 7:00am-4:30pm or 7:00am -11:30am on weekends

## 2024-02-14 NOTE — Progress Notes (Signed)
 Cardiologist:  Oneal  Subjective:  Denies SSCP, palpitations or Dyspnea Still with some abdominal pain   Objective:  Vitals:   02/13/24 2011 02/13/24 2326 02/14/24 0333 02/14/24 0726  BP: (!) 149/78 (!) 144/72 111/69 129/82  Pulse: (!) 111 (!) 105 (!) 101 96  Resp: 18 18 20 19   Temp: 98.5 F (36.9 C) 98.3 F (36.8 C) 97.6 F (36.4 C) 98 F (36.7 C)  TempSrc: Oral Oral Oral Oral  SpO2: 100% 94% 96% 94%  Weight:      Height:        Intake/Output from previous day:  Intake/Output Summary (Last 24 hours) at 02/14/2024 0834 Last data filed at 02/14/2024 0809 Gross per 24 hour  Intake --  Output 1100 ml  Net -1100 ml    Physical Exam: Elderly male Not septic appearing SEM and AR murmur Mild RUQ pain no rebound or distention No edema Palpable pedal pulses   Lab Results: Basic Metabolic Panel: Recent Labs    02/13/24 0543 02/14/24 0455  NA 136 139  K 3.7 3.7  CL 104 103  CO2 23 23  GLUCOSE 114* 97  BUN 14 14  CREATININE 1.03 1.11  CALCIUM 8.4* 8.6*   Liver Function Tests: Recent Labs    02/13/24 0543 02/14/24 0455  AST 16  17 16   ALT 11  12 11   ALKPHOS 47  43 49  BILITOT 3.3*  3.4* 2.6*  PROT 6.0*  5.9* 5.6*  ALBUMIN 2.9*  2.9* 2.6*   No results for input(s): "LIPASE", "AMYLASE" in the last 72 hours. CBC: Recent Labs    02/12/24 2128 02/13/24 0543 02/14/24 0455  WBC 8.9 8.8 7.0  NEUTROABS 6.8  --   --   HGB 16.9 16.5 15.3  HCT 50.4 47.9 45.1  MCV 90.0 87.1 88.3  PLT 174 182 184     Imaging: NM Hepatobiliary Liver Func Result Date: 02/13/2024 CLINICAL DATA:  Right upper quadrant abdominal pain, nondiagnostic ultrasound, concern for acalculous cholecystitis EXAM: NUCLEAR MEDICINE HEPATOBILIARY IMAGING TECHNIQUE: Sequential images of the abdomen were obtained out to 60 minutes following intravenous administration of radiopharmaceutical. RADIOPHARMACEUTICALS:  5.2 mCi Tc-60m Choletec IV, 3 mg of morphine IV COMPARISON:  Ultrasound  02/12/2024 FINDINGS: There is prompt uptake of radiotracer by the hepatic parenchyma. Normal excretion is seen through the common bile duct into the small bowel. The gallbladder was not visualized by 60 minutes. Morphine was then administered per protocol, with continued failure to visualize the gallbladder. IMPRESSION: 1. Failure to visualize the gallbladder despite morphine administration, consistent with cystic duct obstruction and cholecystitis. 2. No evidence of common bile duct obstruction, with normal excretion of radiotracer into the bowel. Electronically Signed   By: Bobbye Burrow M.D.   On: 02/13/2024 17:41   US  Abdomen Limited RUQ (LIVER/GB) Result Date: 02/12/2024 CLINICAL DATA:  Abdominal pain EXAM: ULTRASOUND ABDOMEN LIMITED RIGHT UPPER QUADRANT COMPARISON:  CT chest 02/12/2024 and CT abdomen pelvis 07/16/2023 FINDINGS: Gallbladder: Sludge is present within the gallbladder. There is marked wall thickening measuring 10 mm in thickness. A positive sonographic Abigail Abler sign was noted by the sonographer. Common bile duct: Diameter: Not visualized. Liver: No focal lesion identified. Within normal limits in parenchymal echogenicity. Portal vein is patent on color Doppler imaging with normal direction of blood flow towards the liver. Other: None. IMPRESSION: Findings consistent with acute cholecystitis. Electronically Signed   By: Rozell Cornet M.D.   On: 02/12/2024 23:32   CT Angio Chest Aorta w/CM &/OR wo/CM Result  Date: 02/12/2024 CLINICAL DATA:  Acute aortic syndrome suspected.  Abdominal pain. EXAM: CT ANGIOGRAPHY CHEST WITH CONTRAST TECHNIQUE: Multidetector CT imaging of the chest was performed using the standard protocol during bolus administration of intravenous contrast. Multiplanar CT image reconstructions and MIPs were obtained to evaluate the vascular anatomy. RADIATION DOSE REDUCTION: This exam was performed according to the departmental dose-optimization program which includes automated  exposure control, adjustment of the mA and/or kV according to patient size and/or use of iterative reconstruction technique. CONTRAST:  75mL OMNIPAQUE  IOHEXOL  350 MG/ML SOLN COMPARISON:  09/27/2021 FINDINGS: Cardiovascular: Images are obtained during the pulmonary arterial phase after contrast material administration. There is good opacification of the central and segmental pulmonary arteries. No focal filling defects. No evidence of significant pulmonary embolus. Poor opacification of the aorta during this phase limits evaluation of the aorta 4 dissection or other intrinsic aortic abnormality. There is a ascending thoracic aortic aneurysm measuring 4.7 cm in diameter. Calcification of the aorta and coronary arteries. Normal heart size. No pericardial effusions. Mediastinum/Nodes: No enlarged mediastinal, hilar, or axillary lymph nodes. Thyroid  gland, trachea, and esophagus demonstrate no significant findings. Lungs/Pleura: Motion artifact limits examination. There is evidence of infiltration or atelectasis in both lung bases. No pleural effusion or pneumothorax. Upper Abdomen: Poor definition of the gallbladder wall suggest pericholecystic edema, possibly due to cholecystitis. No stones are demonstrated. Bilateral renal cysts are partially visualized. These are better demonstrated on prior CT abdomen and pelvis from 07/16/2023. No imaging follow-up is indicated. Musculoskeletal: Degenerative changes in the spine. No acute bony abnormalities. Review of the MIP images confirms the above findings. IMPRESSION: 1. Images were obtained during the pulmonary arterial phase. No evidence of significant pulmonary embolus. 2. Ascending aortic aneurysm measuring 4.7 cm diameter. Ascending thoracic aortic aneurysm. Recommend semi-annual imaging followup by CTA or MRA and referral to cardiothoracic surgery if not already obtained. This recommendation follows 2010 ACCF/AHA/AATS/ACR/ASA/SCA/SCAI/SIR/STS/SVM Guidelines for the  Diagnosis and Management of Patients With Thoracic Aortic Disease. Circulation. 2010; 121: G956-O130. Aortic aneurysm NOS (ICD10-I71.9) 3. Infiltration or atelectasis in both lung bases. 4. Poorly defined gallbladder wall suggesting pericholecystic edema. This could indicate cholecystitis in the appropriate clinical setting. Consider ultrasound for further evaluation. 5. Aortic atherosclerosis. Electronically Signed   By: Boyce Byes M.D.   On: 02/12/2024 23:06    Cardiac Studies:  ECG: afib rate 137 no acute changes    Telemetry: afib rates 100   Echo:   Medications:    finasteride   5 mg Oral Daily   metoprolol  tartrate  50 mg Oral Q8H   probenecid   500 mg Oral Daily   tamsulosin   0.4 mg Oral Daily      diltiazem (CARDIZEM) infusion 15 mg/hr (02/14/24 0230)   heparin 1,350 Units/hr (02/13/24 2234)   piperacillin-tazobactam (ZOSYN)  IV 3.375 g (02/14/24 0512)    Assessment/Plan:   Preoperative:  Normal EF and can achieve > 5 METS No history of CAD. Clear to have GB surgery under general anesthesia Afib: new onset April On heparin with Rx level. eliquis  being held Can hold heparin 2 hours before surgery and resume eliquis  post op when ok with general surgery. Increased oral lopressor  to 50 mg q 8 hours continue iv cardizem both for rate control. If needed in OR can have anesthesia use esmolol if rates high GB:  WBC normal pain a bit improved HIDA consistent with cystic duct obstruction US  consistent with acute cholecystitis TB elevated but improved Per general surgery  Aortic aneurysm:  4.3 by  TTE 4.7 by CTA f/u CTA in 6 months   Janelle Mediate 02/14/2024, 8:34 AM

## 2024-02-14 NOTE — Plan of Care (Signed)

## 2024-02-15 ENCOUNTER — Inpatient Hospital Stay (HOSPITAL_COMMUNITY): Admitting: Anesthesiology

## 2024-02-15 ENCOUNTER — Encounter (HOSPITAL_COMMUNITY): Payer: Self-pay | Admitting: Internal Medicine

## 2024-02-15 ENCOUNTER — Other Ambulatory Visit: Payer: Self-pay

## 2024-02-15 ENCOUNTER — Encounter (HOSPITAL_COMMUNITY)
Admission: EM | Disposition: A | Discharge status: Home Health | Payer: Self-pay | Source: Home / Self Care | Attending: Internal Medicine

## 2024-02-15 DIAGNOSIS — K812 Acute cholecystitis with chronic cholecystitis: Secondary | ICD-10-CM | POA: Diagnosis not present

## 2024-02-15 DIAGNOSIS — Z7901 Long term (current) use of anticoagulants: Secondary | ICD-10-CM | POA: Diagnosis not present

## 2024-02-15 DIAGNOSIS — K810 Acute cholecystitis: Secondary | ICD-10-CM | POA: Diagnosis not present

## 2024-02-15 DIAGNOSIS — Z5181 Encounter for therapeutic drug level monitoring: Secondary | ICD-10-CM | POA: Diagnosis not present

## 2024-02-15 DIAGNOSIS — I482 Chronic atrial fibrillation, unspecified: Secondary | ICD-10-CM | POA: Diagnosis not present

## 2024-02-15 HISTORY — PX: CHOLECYSTECTOMY: SHX55

## 2024-02-15 LAB — COMPREHENSIVE METABOLIC PANEL WITH GFR
ALT: 11 U/L (ref 0–44)
AST: 16 U/L (ref 15–41)
Albumin: 2.5 g/dL — ABNORMAL LOW (ref 3.5–5.0)
Alkaline Phosphatase: 47 U/L (ref 38–126)
Anion gap: 12 (ref 5–15)
BUN: 16 mg/dL (ref 8–23)
CO2: 21 mmol/L — ABNORMAL LOW (ref 22–32)
Calcium: 8.4 mg/dL — ABNORMAL LOW (ref 8.9–10.3)
Chloride: 104 mmol/L (ref 98–111)
Creatinine, Ser: 1.07 mg/dL (ref 0.61–1.24)
GFR, Estimated: >60 mL/min (ref >60)
Glucose, Bld: 106 mg/dL — ABNORMAL HIGH (ref 70–99)
Potassium: 3.5 mmol/L (ref 3.5–5.1)
Sodium: 137 mmol/L (ref 135–145)
Total Bilirubin: 2.1 mg/dL — ABNORMAL HIGH (ref 0.0–1.2)
Total Protein: 5.7 g/dL — ABNORMAL LOW (ref 6.5–8.1)

## 2024-02-15 LAB — CBC
HCT: 43.8 % (ref 39.0–52.0)
Hemoglobin: 15 g/dL (ref 13.0–17.0)
MCH: 30.1 pg (ref 26.0–34.0)
MCHC: 34.2 g/dL (ref 30.0–36.0)
MCV: 87.8 fL (ref 80.0–100.0)
Platelets: 207 10*3/uL (ref 150–400)
RBC: 4.99 MIL/uL (ref 4.22–5.81)
RDW: 13.8 % (ref 11.5–15.5)
WBC: 7.1 10*3/uL (ref 4.0–10.5)
nRBC: 0 % (ref 0.0–0.2)

## 2024-02-15 LAB — MAGNESIUM: Magnesium: 2 mg/dL (ref 1.7–2.4)

## 2024-02-15 LAB — PHOSPHORUS: Phosphorus: 2.6 mg/dL (ref 2.5–4.6)

## 2024-02-15 SURGERY — LAPAROSCOPIC CHOLECYSTECTOMY
Anesthesia: General | Site: Abdomen

## 2024-02-15 MED ORDER — DOCUSATE SODIUM 100 MG PO CAPS
100.0000 mg | ORAL_CAPSULE | Freq: Two times a day (BID) | ORAL | Status: DC
  Administered 2024-02-15 – 2024-02-20 (×11): 100 mg via ORAL
  Filled 2024-02-15 (×13): qty 1

## 2024-02-15 MED ORDER — OXYCODONE HCL 5 MG PO TABS
5.0000 mg | ORAL_TABLET | ORAL | Status: DC | PRN
  Administered 2024-02-15 – 2024-02-16 (×2): 5 mg via ORAL
  Filled 2024-02-15 (×2): qty 1

## 2024-02-15 MED ORDER — PHENYLEPHRINE 80 MCG/ML (10ML) SYRINGE FOR IV PUSH (FOR BLOOD PRESSURE SUPPORT)
PREFILLED_SYRINGE | INTRAVENOUS | Status: DC | PRN
  Administered 2024-02-15 (×3): 160 ug via INTRAVENOUS
  Administered 2024-02-15 (×2): 80 ug via INTRAVENOUS
  Administered 2024-02-15: 160 ug via INTRAVENOUS

## 2024-02-15 MED ORDER — SUGAMMADEX SODIUM 200 MG/2ML IV SOLN
INTRAVENOUS | Status: DC | PRN
  Administered 2024-02-15: 200 mg via INTRAVENOUS

## 2024-02-15 MED ORDER — DROPERIDOL 2.5 MG/ML IJ SOLN: 0.6250 mg | Freq: Once | INTRAMUSCULAR | Status: DC | PRN

## 2024-02-15 MED ORDER — FENTANYL CITRATE (PF) 250 MCG/5ML IJ SOLN
INTRAMUSCULAR | Status: AC
  Filled 2024-02-15: qty 5

## 2024-02-15 MED ORDER — HEMOSTATIC AGENTS (NO CHARGE) OPTIME
TOPICAL | Status: DC | PRN
  Administered 2024-02-15: 1 via TOPICAL

## 2024-02-15 MED ORDER — ROCURONIUM BROMIDE 10 MG/ML (PF) SYRINGE
PREFILLED_SYRINGE | INTRAVENOUS | Status: DC | PRN
  Administered 2024-02-15: 10 mg via INTRAVENOUS
  Administered 2024-02-15: 60 mg via INTRAVENOUS

## 2024-02-15 MED ORDER — HEPARIN (PORCINE) 25000 UT/250ML-% IV SOLN
1400.0000 [IU]/h | INTRAVENOUS | Status: AC
  Administered 2024-02-15: 1350 [IU]/h via INTRAVENOUS
  Administered 2024-02-16: 1400 [IU]/h via INTRAVENOUS
  Filled 2024-02-15 (×2): qty 250

## 2024-02-15 MED ORDER — OXYCODONE HCL 5 MG/5ML PO SOLN: 5.0000 mg | Freq: Once | ORAL | Status: DC | PRN

## 2024-02-15 MED ORDER — OXYCODONE HCL 5 MG PO TABS: 5.0000 mg | ORAL_TABLET | Freq: Once | ORAL | Status: DC | PRN

## 2024-02-15 MED ORDER — CHLORHEXIDINE GLUCONATE 0.12 % MT SOLN
15.0000 mL | Freq: Once | OROMUCOSAL | Status: AC
  Administered 2024-02-15: 15 mL via OROMUCOSAL
  Filled 2024-02-15: qty 15

## 2024-02-15 MED ORDER — INDOCYANINE GREEN 25 MG IV SOLR
1.2500 mg | INTRAVENOUS | Status: AC
Start: 2024-02-15 — End: 2024-02-15
  Administered 2024-02-15: 1.25 mg via INTRAVENOUS
  Filled 2024-02-15: qty 10

## 2024-02-15 MED ORDER — DEXAMETHASONE SODIUM PHOSPHATE 10 MG/ML IJ SOLN
INTRAMUSCULAR | Status: DC | PRN
  Administered 2024-02-15: 10 mg via INTRAVENOUS

## 2024-02-15 MED ORDER — PHENYLEPHRINE HCL-NACL 20-0.9 MG/250ML-% IV SOLN
INTRAVENOUS | Status: DC | PRN
  Administered 2024-02-15: 50 ug/min via INTRAVENOUS

## 2024-02-15 MED ORDER — ORAL CARE MOUTH RINSE: 15.0000 mL | Freq: Once | OROMUCOSAL | Status: AC

## 2024-02-15 MED ORDER — SODIUM CHLORIDE 0.9 % IR SOLN
Status: DC | PRN
  Administered 2024-02-15: 1000 mL

## 2024-02-15 MED ORDER — FENTANYL CITRATE (PF) 100 MCG/2ML IJ SOLN: 25.0000 ug | INTRAMUSCULAR | Status: DC | PRN

## 2024-02-15 MED ORDER — ALBUMIN HUMAN 5 % IV SOLN
INTRAVENOUS | Status: DC | PRN
Start: 2024-02-15 — End: 2024-02-15

## 2024-02-15 MED ORDER — LIDOCAINE 2% (20 MG/ML) 5 ML SYRINGE
INTRAMUSCULAR | Status: DC | PRN
  Administered 2024-02-15: 100 mg via INTRAVENOUS

## 2024-02-15 MED ORDER — BUPIVACAINE-EPINEPHRINE (PF) 0.25% -1:200000 IJ SOLN
INTRAMUSCULAR | Status: AC
  Filled 2024-02-15: qty 30

## 2024-02-15 MED ORDER — BUPIVACAINE-EPINEPHRINE 0.25% -1:200000 IJ SOLN
INTRAMUSCULAR | Status: DC | PRN
  Administered 2024-02-15: 19 mL

## 2024-02-15 MED ORDER — 0.9 % SODIUM CHLORIDE (POUR BTL) OPTIME
TOPICAL | Status: DC | PRN
  Administered 2024-02-15: 1000 mL

## 2024-02-15 MED ORDER — ACETAMINOPHEN 500 MG PO TABS
1000.0000 mg | ORAL_TABLET | Freq: Once | ORAL | Status: AC
  Administered 2024-02-15: 1000 mg via ORAL
  Filled 2024-02-15: qty 2

## 2024-02-15 MED ORDER — METOPROLOL TARTRATE 5 MG/5ML IV SOLN: 2.5000 mg | INTRAVENOUS | Status: DC | PRN

## 2024-02-15 MED ORDER — FENTANYL CITRATE (PF) 250 MCG/5ML IJ SOLN
INTRAMUSCULAR | Status: DC | PRN
  Administered 2024-02-15 (×2): 50 ug via INTRAVENOUS

## 2024-02-15 MED ORDER — LACTATED RINGERS IV SOLN: INTRAVENOUS | Status: DC | PRN

## 2024-02-15 MED ORDER — VASOPRESSIN 20 UNIT/ML IV SOLN
INTRAVENOUS | Status: AC
  Filled 2024-02-15: qty 1

## 2024-02-15 MED ORDER — PROPOFOL 10 MG/ML IV BOLUS
INTRAVENOUS | Status: DC | PRN
  Administered 2024-02-15: 100 mg via INTRAVENOUS

## 2024-02-15 MED ORDER — LACTATED RINGERS IV SOLN: INTRAVENOUS | Status: DC

## 2024-02-15 MED ORDER — VASOPRESSIN 20 UNIT/ML IV SOLN
INTRAVENOUS | Status: DC | PRN
  Administered 2024-02-15 (×2): 1 [IU] via INTRAVENOUS

## 2024-02-15 SURGICAL SUPPLY — 35 items
BAG COUNTER SPONGE SURGICOUNT (BAG) ×1 IMPLANT
BLADE CLIPPER SURG (BLADE) IMPLANT
CANISTER SUCT 3000ML PPV (MISCELLANEOUS) ×1 IMPLANT
CHLORAPREP W/TINT 26 (MISCELLANEOUS) ×1 IMPLANT
CLIP APPLIE ROT 10 11.4 M/L (STAPLE) ×1 IMPLANT
COVER SURGICAL LIGHT HANDLE (MISCELLANEOUS) ×1 IMPLANT
CUTTER FLEX LINEAR 45M (STAPLE) IMPLANT
DERMABOND ADVANCED .7 DNX12 (GAUZE/BANDAGES/DRESSINGS) ×1 IMPLANT
ELECTRODE REM PT RTRN 9FT ADLT (ELECTROSURGICAL) ×1 IMPLANT
GLOVE BIO SURGEON STRL SZ 6 (GLOVE) ×1 IMPLANT
GLOVE INDICATOR 6.5 STRL GRN (GLOVE) ×1 IMPLANT
GOWN STRL REUS W/ TWL LRG LVL3 (GOWN DISPOSABLE) ×3 IMPLANT
GRASPER SUT TROCAR 14GX15 (MISCELLANEOUS) ×1 IMPLANT
HEMOSTAT SNOW SURGICEL 2X4 (HEMOSTASIS) IMPLANT
IRRIGATION SUCT STRKRFLW 2 WTP (MISCELLANEOUS) ×1 IMPLANT
KIT BASIN OR (CUSTOM PROCEDURE TRAY) ×1 IMPLANT
KIT TURNOVER KIT B (KITS) ×1 IMPLANT
NDL INSUFFLATION 14GA 120MM (NEEDLE) ×1 IMPLANT
NEEDLE INSUFFLATION 14GA 120MM (NEEDLE) ×1 IMPLANT
NS IRRIG 1000ML POUR BTL (IV SOLUTION) ×1 IMPLANT
RELOAD STAPLE 45 3.5 BLU ETS (ENDOMECHANICALS) IMPLANT
RELOAD STAPLE TA45 3.5 REG BLU (ENDOMECHANICALS) ×2 IMPLANT
SCISSORS LAP 5X35 DISP (ENDOMECHANICALS) ×1 IMPLANT
SET TUBE SMOKE EVAC HIGH FLOW (TUBING) ×1 IMPLANT
SLEEVE Z-THREAD 5X100MM (TROCAR) ×2 IMPLANT
SPECIMEN JAR SMALL (MISCELLANEOUS) ×1 IMPLANT
SUT MNCRL AB 4-0 PS2 18 (SUTURE) ×1 IMPLANT
SYSTEM BAG RETRIEVAL 10MM (BASKET) ×1 IMPLANT
TOWEL GREEN STERILE FF (TOWEL DISPOSABLE) ×1 IMPLANT
TRAY LAPAROSCOPIC MC (CUSTOM PROCEDURE TRAY) ×1 IMPLANT
TROCAR 11X100 Z THREAD (TROCAR) ×1 IMPLANT
TROCAR Z THREAD OPTICAL 12X100 (TROCAR) IMPLANT
TROCAR Z-THREAD OPTICAL 5X100M (TROCAR) ×1 IMPLANT
WARMER LAPAROSCOPE (MISCELLANEOUS) ×1 IMPLANT
WATER STERILE IRR 1000ML POUR (IV SOLUTION) ×1 IMPLANT

## 2024-02-15 NOTE — Anesthesia Postprocedure Evaluation (Signed)
 Anesthesia Post Note  Patient: Jose Hood  Procedure(s) Performed: LAPAROSCOPIC CHOLECYSTECTOMY (Abdomen)     Patient location during evaluation: PACU Anesthesia Type: General Level of consciousness: awake and alert Pain management: pain level controlled Vital Signs Assessment: post-procedure vital signs reviewed and stable Respiratory status: spontaneous breathing, nonlabored ventilation and respiratory function stable Cardiovascular status: blood pressure returned to baseline Postop Assessment: no apparent nausea or vomiting Anesthetic complications: no   No notable events documented.  Last Vitals:  Vitals:   02/15/24 1115 02/15/24 1126  BP: 132/80 128/68  Pulse: (!) 102 (!) 103  Resp: 18 20  Temp:  36.8 C  SpO2: 94% 92%    Last Pain:  Vitals:   02/15/24 1115  TempSrc:   PainSc: 0-No pain                 Rayfield Cairo

## 2024-02-15 NOTE — Interval H&P Note (Signed)
 History and Physical Interval Note:  02/15/2024 8:24 AM  Jose Hood  has presented today for surgery, with the diagnosis of Cholecystitis.  The various methods of treatment have been discussed with the patient and family. After consideration of risks, benefits and other options for treatment, the patient has consented to  Procedure(s): LAPAROSCOPIC CHOLECYSTECTOMY (N/A) as a surgical intervention.  The patient's history has been reviewed, patient examined, no change in status, stable for surgery.  I have reviewed the patient's chart and labs.  Questions were answered to the patient's satisfaction.     Brandin Stetzer Loyola Rummage

## 2024-02-15 NOTE — Plan of Care (Signed)
   Problem: Education: Goal: Knowledge of General Education information will improve Description Including pain rating scale, medication(s)/side effects and non-pharmacologic comfort measures Outcome: Progressing   Problem: Health Behavior/Discharge Planning: Goal: Ability to manage health-related needs will improve Outcome: Progressing

## 2024-02-15 NOTE — Anesthesia Procedure Notes (Signed)
 Procedure Name: Intubation Date/Time: 02/15/2024 9:29 AM  Performed by: Hebert Littler, CRNAPre-anesthesia Checklist: Patient identified, Emergency Drugs available, Suction available, Patient being monitored and Timeout performed Patient Re-evaluated:Patient Re-evaluated prior to induction Oxygen Delivery Method: Circle system utilized Preoxygenation: Pre-oxygenation with 100% oxygen Induction Type: IV induction Ventilation: Mask ventilation without difficulty Laryngoscope Size: Mac and 3 Grade View: Grade I Tube size: 7.0 mm Number of attempts: 1 Airway Equipment and Method: Patient positioned with wedge pillow and Stylet Placement Confirmation: ETT inserted through vocal cords under direct vision, positive ETCO2, CO2 detector and breath sounds checked- equal and bilateral Secured at: 23 cm Tube secured with: Tape

## 2024-02-15 NOTE — Progress Notes (Signed)
 [facility] Progress Note Patient: Jose Hood WUJ:[id number] DOB: Feb 15, 1941 DOA: 02/12/2024  DOS: the patient was seen and examined on 02/15/2024  Brief Hospital Course: PMH of paroxysmal A-fib on Eliquis , HTN, gout presented to the hospital with complaints of abdominal pain and nausea and vomiting.  Found to have acute cholecystitis.  General surgery was consulted. Cardiology was consulted as well for preop clearance. Currently on IV heparin .  Scheduled for surgery on 5/1.  Assessment and Plan: Acute cholecystitis Presented with abdominal pain.  Ultrasound shows gallbladder wall thickening with sludge. General surgery consulted. HIDA scan is actually positive. Currently on IV antibiotic. Underwent lap chole on 5/1. Monitor postop recovery. Cardiology cleared the patient for surgery.   Paroxysmal A-fib with RVR Heart rate uncontrolled, in the 150s, started on Cardizem  drip,  Cardiology following.  Also on oral metoprolol . Management per cardiology.  On IV heparin .  As needed Lopressor  added.   Hypertension Resume home dose metoprolol  On Cardizem .   BPH Continue with Flomax  and Proscar    Gout Continue with probenecid    Ascending aortic aneurysm measuring 4.7 cm diameter.   Cardiology recommend semi-annual imaging followup by CTA or MRA and referral to cardiothoracic surgery if not already obtained.    Subjective: No nausea no vomiting.  Seen after surgery.  Pain well-controlled.  Physical Exam: General: in Mild distress, No Rash Cardiovascular: S1 and S2 Present, No Murmur Respiratory: Good respiratory effort, Bilateral Air entry present. No Crackles, No wheezes Abdomen: Bowel Sound present, right upper quadrant tenderness Extremities: No edema Neuro: Alert and oriented x3, no new focal deficit  Data Reviewed: I have Reviewed nursing notes, Vitals, and Lab results. Since last encounter, pertinent lab results CBC and BMP   . I have ordered test including CBC and BMP   .   Disposition: Status is: Inpatient Remains inpatient appropriate because: Monitor for postop recovery.  Continue IV heparin .  Continue Cardizem  drip.  Still has RVR.   Family Communication: Family at bedside Level of care: Progressive   Vitals:   02/15/24 1115 02/15/24 1126 02/15/24 1534 02/15/24 1600  BP: 132/80 128/68 126/72 (!) 126/99  Pulse: (!) 102 (!) 103 66 (!) 110  Resp: 18 20 17    Temp:  98.3 F (36.8 C) 97.9 F (36.6 C)   TempSrc:   Oral   SpO2: 94% 92% 92%   Weight:      Height:         Author: Charlean Congress, MD 02/15/2024 8:14 PM  Please look on [email or web address] to find out who is on call.

## 2024-02-15 NOTE — Progress Notes (Signed)
 PHARMACY - ANTICOAGULATION CONSULT NOTE  Pharmacy Consult for IV heparin  Indication: atrial fibrillation  Patient Measurements: Height: 6\' 1"  (185.4 cm) Weight: 86.2 kg (190 lb) IBW/kg (Calculated) : 79.9 HEPARIN  DW (KG): 86.2  Labs: Recent Labs    02/12/24 2128 02/13/24 0543 02/13/24 1036 02/13/24 2105 02/14/24 0455 02/15/24 0443  HGB 16.9 16.5  --   --  15.3 15.0  HCT 50.4 47.9  --   --  45.1 43.8  PLT 174 182  --   --  184 207  APTT  --   --  49* 83* 99*  --   HEPARINUNFRC  --   --  0.68  --  0.69  --   CREATININE 1.23 1.03  --   --  1.11 1.07  TROPONINIHS 10  --   --   --   --   --   Estimated Creatinine Clearance: 59.1 mL/min (by C-G formula based on SCr of 1.07 mg/dL).  Medical History: Past Medical History:  Diagnosis Date   Afib (HCC)    Gout    Hypertension    Shingles 07/20/2016   Assessment: Jose Hood is a 83 y.o. year old male admitted on 02/12/2024 with concern for abdominal pain with concern for intra-abdominal infection requiring surgery intervention. Eliquis  prior to admission for afib (last dose 02/11/24 PM). Pharmacy consulted to dose heparin .  Heparin  was on hold for cholecystectomy and to resume at 11pm tonight  Goal of Therapy:  aPTT 66-102 seconds Monitor platelets by anticoagulation protocol: Yes   Plan:  Restart heparin  1350 units/hr at 11pm APTT and heparin  level in 8 hrs Daily heparin  level and CBC  [name], PharmD Clinical Pharmacist **Pharmacist phone directory can now be found on [email or web address] ([email or web address]).  Listed under [facility].

## 2024-02-15 NOTE — Progress Notes (Signed)
 PT Cancellation Note  Patient Details Name: Jose Hood MRN: [id number] DOB: 01-23-1941   Cancelled Treatment:    Reason Eval/Treat Not Completed: Patient at procedure or test/unavailable (Pt off floor for laparoscopic cholecystectomy. Will follow-up for PT session as schedule permits.)  Glenford Lanes, PT, DPT Acute Rehabilitation Services Office: [phone] Secure Chat Preferred  Jose Hood 02/15/2024, 7:20 AM

## 2024-02-15 NOTE — Op Note (Signed)
 Operative Note  Harvin Mccully 83 y.o. male [id number]  02/15/2024  Surgeon: Adalberto Acton MD FACS  Assistant: Dorena Gander MD FACS  Procedure performed: Laparoscopic subtotal stapled cholecystectomy  Procedure classification: Emergent  Preop diagnosis: Acute cholecystitis Post-op diagnosis/intraop findings: Severe acute on chronic cholecystitis  Specimens: gallbladder  Retained items: none  EBL: 50mL  Complications: none  Description of procedure: After obtaining informed consent the patient was brought to the operating room. Antibiotics were administered. SCD's were applied. General endotracheal anesthesia was initiated and a formal time-out was performed. The abdomen was prepped and draped in the usual sterile fashion and the abdomen was entered using  a left subcostal Veress needle  after instilling the site with local. Insufflation to was obtained, 5mm trocar and camera inserted using optical entry in the right upper quadrant, and gross inspection revealed no evidence of injury from our entry or other intraabdominal abnormalities. Two 5mm trocars were introduced in the infraumbilical and right anterior axillary lines under direct visualization and following infiltration with local. An 11mm trocar was placed in the epigastrium. The gallbladder was thickened, tensely distended with intense erythema of the wall and omental adhesions consistent with acute on chronic cholecystitis.  The gallbladder was decompressed with a Nezhat in order to allow it to be grasped.  The gallbladder fundus was retracted cephalad and the infundibulum was retracted laterally.  There was very thickened, fibrotic infiltration of the peritoneum overlying the infundibulum and gallbladder neck; and the border was clearly tethered medially secondary to severe inflammatory changes.  A combination of hook electrocautery and blunt dissection was utilized to attempt to clear the peritoneum from the neck and cystic  duct, however the inflammatory changes were quite extreme and we were not able to safely dissect down onto the cystic duct.  There was also extreme friability of the tissues with oozing from essentially every surface that was dissected or cauterized.  Ultimately I was able to bluntly create a window behind the infundibulum and this was bluntly developed up onto the gallbladder bed.  At this point we used a Traum load linear cutting stapler to transect the gallbladder infundibulum with excellent apposition.  The gallbladder was dissected from the liver plate using electrocautery. Once freed the gallbladder was placed in an endocatch bag and removed through the epigastric trocar site. A small amount of bleeding on the liver bed was controlled with cautery and Surgicel snow. The right upper quadrant was irrigated with warm sterile saline; the effluent was clear. Hemostasis was once again confirmed, and reinspection of the abdomen revealed no injuries. The staple line appears intact, viable and hemostatic without any bile leak from the staple line or the liver bed. The 12mm trocar site in the epigastrium was closed with two simple interrupted 0 vicryls in the fascia under direct visualization using a PMI device. The abdomen was desufflated and all trocars removed. The skin incisions were closed with subcuticular 4-0 monocryl and Dermabond. The patient was awakened, extubated and transported to the recovery room in stable condition.    All counts were correct at the completion of the case.

## 2024-02-15 NOTE — Transfer of Care (Signed)
 Immediate Anesthesia Transfer of Care Note  Patient: Jose Hood  Procedure(s) Performed: LAPAROSCOPIC CHOLECYSTECTOMY (Abdomen)  Patient Location: PACU  Anesthesia Type:General  Level of Consciousness: awake, alert , oriented, and patient cooperative  Airway & Oxygen Therapy: Patient Spontanous Breathing and Patient connected to face mask oxygen  Post-op Assessment: Report given to RN, Post -op Vital signs reviewed and stable, Patient moving all extremities, Patient moving all extremities X 4, and Patient able to stick tongue midline  Post vital signs: Reviewed and stable  Last Vitals:  Vitals Value Taken Time  BP 128/68 02/15/24 1126  Temp 36.8 C 02/15/24 1126  Pulse 106 02/15/24 1140  Resp 20 02/15/24 1126  SpO2 92 % 02/15/24 1140  Vitals shown include unfiled device data.  Last Pain:  Vitals:   02/15/24 1115  TempSrc:   PainSc: 0-No pain         Complications: No notable events documented.

## 2024-02-15 NOTE — Progress Notes (Signed)
 Physical Therapy Treatment Patient Details Name: Jose Hood MRN: [id number] DOB: February 01, 1941 Today's Date: 02/15/2024   History of Present Illness Patient is an 83 y.o. male admitted 02/12/24 with abdominal pain, N&V and increased urination.  Found to be in A-fib w/RVR with CT notable for potential acute cholecystitis. Pt s/p laparoscopic cholecystectomy 5/1. PMH positive for gout, HTN, and A-fib on Eliquis .    PT Comments  Pt greeted supine in bed, pleasant and agreeable to PT session. Introduced RW and educated pt on safe use and proper technique. Pt required CGA for functional mobility using RW and intermittent VC for sequencing. He ambulated ~28ft without LOB. Will continue to follow acutely and advance appropriately.     If plan is discharge home, recommend the following: A little help with walking and/or transfers;Help with stairs or ramp for entrance;A little help with bathing/dressing/bathroom;Assistance with cooking/housework;Assist for transportation   Can travel by private vehicle        Equipment Recommendations  None recommended by PT    Recommendations for Other Services       Precautions / Restrictions Precautions Precautions: Fall Recall of Precautions/Restrictions: Intact Restrictions Weight Bearing Restrictions Per Provider Order: No     Mobility  Bed Mobility Overal bed mobility: Needs Assistance Bed Mobility: Supine to Sit     Supine to sit: Contact guard, HOB elevated, Used rails     General bed mobility comments: Pt sat up on R side of bed with increased time and VC for sequencing. Assist for line management and to elevate trunk. Pt left seated EOB to eat his dinner.    Transfers Overall transfer level: Needs assistance Equipment used: Rolling walker (2 wheels) Transfers: Sit to/from Stand Sit to Stand: Contact guard assist           General transfer comment: Pt stood from lowest bed height. VC to scoot fwd to the edge and proper hand positioning  using RW. CGA to powered up. Good eccentric control with sitting.    Ambulation/Gait Ambulation/Gait assistance: Contact guard assist Gait Distance (Feet): 200 Feet Assistive device: Rolling walker (2 wheels) Gait Pattern/deviations: Step-through pattern, Decreased stride length, Trunk flexed Gait velocity: reduced Gait velocity interpretation: <1.8 ft/sec, indicate of risk for recurrent falls   General Gait Details: Educated pt on proper sequencing using RW. Intermittent VC for technique and positioning of RW. Pt ambulated with a reciprocal gait pattern, even weight shift, and good foot clearence. He maintained a fwd lean. VC to increase upright posture and look ahead down the hallway instead of glued to the floor. No LOB.   Stairs             Wheelchair Mobility     Tilt Bed    Modified Rankin (Stroke Patients Only)       Balance Overall balance assessment: Needs assistance Sitting-balance support: No upper extremity supported, Feet supported Sitting balance-Leahy Scale: Good     Standing balance support: Bilateral upper extremity supported, During functional activity, Reliant on assistive device for balance Standing balance-Leahy Scale: Poor Standing balance comment: Pt was dependent on RW                            Communication Communication Communication: Impaired Factors Affecting Communication: Hearing impaired  Cognition Arousal: Alert Behavior During Therapy: WFL for tasks assessed/performed   PT - Cognitive impairments: No apparent impairments  Following commands: Intact      Cueing Cueing Techniques: Verbal cues  Exercises      General Comments General comments (skin integrity, edema, etc.): HR supine at rest 108-125 bpm; supine using urinal 130-140s bpm; during gait 150-160s; HRmax 167 bpm. BP 126/99 (109)      Pertinent Vitals/Pain Pain Assessment Pain Assessment: No/denies pain    Home  Living                          Prior Function            PT Goals (current goals can now be found in the care plan section) Acute Rehab PT Goals Patient Stated Goal: Return Home Progress towards PT goals: Progressing toward goals    Frequency    Min 2X/week      PT Plan      Co-evaluation              AM-PAC PT "6 Clicks" Mobility   Outcome Measure  Help needed turning from your back to your side while in a flat bed without using bedrails?: A Little Help needed moving from lying on your back to sitting on the side of a flat bed without using bedrails?: A Little Help needed moving to and from a bed to a chair (including a wheelchair)?: A Little Help needed standing up from a chair using your arms (e.g., wheelchair or bedside chair)?: A Little Help needed to walk in hospital room?: A Little Help needed climbing 3-5 steps with a railing? : Total 6 Click Score: 16    End of Session Equipment Utilized During Treatment: Gait belt Activity Tolerance: Patient tolerated treatment well Patient left: in bed;with call bell/phone within reach;with bed alarm set;with family/visitor present Nurse Communication: Mobility status;Other (comment) (HR response to activity) PT Visit Diagnosis: Difficulty in walking, not elsewhere classified (R26.2)     Time: 4098-1191 PT Time Calculation (min) (ACUTE ONLY): 25 min  Charges:    $Gait Training: 8-22 mins $Therapeutic Activity: 8-22 mins PT General Charges $$ ACUTE PT VISIT: 1 Visit                     Glenford Lanes, PT, DPT Acute Rehabilitation Services Office: [phone] Secure Chat Preferred  Riva Chester 02/15/2024, 5:19 PM

## 2024-02-15 NOTE — Discharge Instructions (Signed)

## 2024-02-16 ENCOUNTER — Encounter (HOSPITAL_COMMUNITY): Payer: Self-pay | Admitting: Surgery

## 2024-02-16 DIAGNOSIS — K810 Acute cholecystitis: Secondary | ICD-10-CM | POA: Diagnosis not present

## 2024-02-16 LAB — COMPREHENSIVE METABOLIC PANEL WITH GFR
ALT: 19 U/L (ref 0–44)
AST: 28 U/L (ref 15–41)
Albumin: 2.6 g/dL — ABNORMAL LOW (ref 3.5–5.0)
Alkaline Phosphatase: 46 U/L (ref 38–126)
Anion gap: 8 (ref 5–15)
BUN: 18 mg/dL (ref 8–23)
CO2: 22 mmol/L (ref 22–32)
Calcium: 8.4 mg/dL — ABNORMAL LOW (ref 8.9–10.3)
Chloride: 106 mmol/L (ref 98–111)
Creatinine, Ser: 1.13 mg/dL (ref 0.61–1.24)
GFR, Estimated: >60 mL/min (ref >60)
Glucose, Bld: 160 mg/dL — ABNORMAL HIGH (ref 70–99)
Potassium: 4.2 mmol/L (ref 3.5–5.1)
Sodium: 136 mmol/L (ref 135–145)
Total Bilirubin: 1.2 mg/dL (ref 0.0–1.2)
Total Protein: 6 g/dL — ABNORMAL LOW (ref 6.5–8.1)

## 2024-02-16 LAB — CBC
HCT: 44 % (ref 39.0–52.0)
Hemoglobin: 15 g/dL (ref 13.0–17.0)
MCH: 30.1 pg (ref 26.0–34.0)
MCHC: 34.1 g/dL (ref 30.0–36.0)
MCV: 88.4 fL (ref 80.0–100.0)
Platelets: 237 10*3/uL (ref 150–400)
RBC: 4.98 MIL/uL (ref 4.22–5.81)
RDW: 13.7 % (ref 11.5–15.5)
WBC: 8.2 10*3/uL (ref 4.0–10.5)
nRBC: 0 % (ref 0.0–0.2)

## 2024-02-16 LAB — APTT: aPTT: 64 s — ABNORMAL HIGH (ref 24–36)

## 2024-02-16 LAB — MAGNESIUM: Magnesium: 2.1 mg/dL (ref 1.7–2.4)

## 2024-02-16 LAB — SURGICAL PATHOLOGY

## 2024-02-16 LAB — HEPARIN LEVEL (UNFRACTIONATED): Heparin Unfractionated: 0.28 [IU]/mL — ABNORMAL LOW (ref 0.30–0.70)

## 2024-02-16 MED ORDER — APIXABAN 5 MG PO TABS
5.0000 mg | ORAL_TABLET | Freq: Two times a day (BID) | ORAL | Status: DC
  Administered 2024-02-16 – 2024-02-21 (×10): 5 mg via ORAL
  Filled 2024-02-16 (×10): qty 1

## 2024-02-16 MED ORDER — ORAL CARE MOUTH RINSE: 15.0000 mL | OROMUCOSAL | Status: DC | PRN

## 2024-02-16 NOTE — Progress Notes (Addendum)
 PHARMACY - ANTICOAGULATION CONSULT NOTE  Pharmacy Consult for IV heparin  > back to Eliquis  Indication: atrial fibrillation  Patient Measurements: Height: 6\' 1"  (185.4 cm) Weight: 86.2 kg (190 lb) IBW/kg (Calculated) : 79.9 HEPARIN  DW (KG): 86.2  Labs: Recent Labs    02/13/24 2105 02/14/24 0455 02/14/24 0455 02/15/24 0443 02/16/24 0727  HGB  --  15.3   < > 15.0 15.0  HCT  --  45.1  --  43.8 44.0  PLT  --  184  --  207 237  APTT 83* 99*  --   --  64*  HEPARINUNFRC  --  0.69  --   --  0.28*  CREATININE  --  1.11  --  1.07 1.13   < > = values in this interval not displayed.  Estimated Creatinine Clearance: 56 mL/min (by C-G formula based on SCr of 1.13 mg/dL).  Medical History: Past Medical History:  Diagnosis Date   Afib (HCC)    Gout    Hypertension    Shingles 07/20/2016   Assessment: Jose Hood is a 83 y.o. year old male admitted on 02/12/2024 with concern for abdominal pain with concern for intra-abdominal infection requiring surgery intervention. Eliquis  prior to admission for afib (last dose 02/11/24 PM). Pharmacy consulted to dose heparin .  Heparin  was on hold for cholecystectomy, resumed yesterday.  Heparin  level this AM slightly below goal at 0.28, aPTT 64 also slightly low.  No overt bleeding or complications noted.  Eliquis  remains on hold for now.  Per surgery notes and discussion with Dr. Stann Earnest, okay to change back to Eliquis  this evening.  Goal of Therapy:  aPTT 66-102 seconds Monitor platelets by anticoagulation protocol: Yes   Plan:  Increase IV heparin  to 1400 units/hr. At 8 pm will stop IV heparin  and resume Eliquis  5 mg po BID.  Joanell Mowers, Davey Erp, BCCP Clinical Pharmacist  02/16/2024 1:15 PM   [facility] pharmacy phone numbers are listed on [email or web address]

## 2024-02-16 NOTE — Progress Notes (Signed)
 Physical Therapy Treatment Patient Details Name: Jose Hood MRN: [id number] DOB: [birth year] Today's Date: 02/16/2024   History of Present Illness Patient is an 83 y.o. male admitted 02/12/24 with abdominal pain, N&V and increased urination.  Found to be in A-fib w/RVR with CT notable for potential acute cholecystitis. Pt s/p laparoscopic cholecystectomy 5/1. PMH positive for gout, HTN, and A-fib on Eliquis .    PT Comments  Today's session focused on the pt's ability to maintain walking balance while responding to different task demands, through various dynamic conditions. He completed the DGI and scored 19/24, which is on the cut-off for increased fall risk. Pt performed functional mobility without an AD and supervision for safety. He ambulated ~582ft and ascended/descended 3 steps without UE support. Pt is making great progress towards his acute PT goals.    If plan is discharge home, recommend the following: A little help with walking and/or transfers;Help with stairs or ramp for entrance;A little help with bathing/dressing/bathroom;Assistance with cooking/housework;Assist for transportation   Can travel by private vehicle        Equipment Recommendations  None recommended by PT    Recommendations for Other Services       Precautions / Restrictions Precautions Precautions: Fall Recall of Precautions/Restrictions: Intact Restrictions Weight Bearing Restrictions Per Provider Order: No     Mobility  Bed Mobility Overal bed mobility: Needs Assistance Bed Mobility: Supine to Sit     Supine to sit: Supervision, HOB elevated, Used rails     General bed mobility comments: Pt sat up on R side of bed with increased time. He utilized the log roll method to get out of bed.    Transfers Overall transfer level: Needs assistance Equipment used: None Transfers: Sit to/from Stand Sit to Stand: Supervision           General transfer comment: Pt stood from lowest bed height by pushing  up with BUE support. Good eccentric control with sitting.    Ambulation/Gait Ambulation/Gait assistance: Supervision Gait Distance (Feet): 500 Feet Assistive device: None Gait Pattern/deviations: Step-through pattern, Decreased stride length Gait velocity: reduced Gait velocity interpretation: <1.8 ft/sec, indicate of risk for recurrent falls   General Gait Details: Pt ambulated with a reciprocal gait pattern, even weight shift, good foot clearence, and equal arm swing. He had some slight unsteadiness with higher level balance challenges. No overt LOB.   Stairs Stairs: Yes Stairs assistance: Supervision Stair Management: No rails, Forwards, Alternating pattern, Step to pattern Number of Stairs: 3 General stair comments: Pt ascended with a reciprocal gait pattern. He turned around on the 3rd step without UE support or LOB. He descended with a step-to pattern.   Wheelchair Mobility     Tilt Bed    Modified Rankin (Stroke Patients Only)       Balance Overall balance assessment: Needs assistance Sitting-balance support: No upper extremity supported, Feet supported Sitting balance-Leahy Scale: Good     Standing balance support: No upper extremity supported, During functional activity Standing balance-Leahy Scale: Fair                   Standardized Balance Assessment Standardized Balance Assessment : Dynamic Gait Index   Dynamic Gait Index Level Surface: Normal Change in Gait Speed: Normal Gait with Horizontal Head Turns: Mild Impairment Gait with Vertical Head Turns: Mild Impairment Gait and Pivot Turn: Normal Step Over Obstacle: Mild Impairment Step Around Obstacles: Mild Impairment Steps: Mild Impairment Total Score: 19      Communication Communication Communication: Impaired Factors  Affecting Communication: Hearing impaired  Cognition Arousal: Alert Behavior During Therapy: WFL for tasks assessed/performed   PT - Cognitive impairments: No apparent  impairments                         Following commands: Intact      Cueing Cueing Techniques: Verbal cues  Exercises      General Comments        Pertinent Vitals/Pain Pain Assessment Pain Assessment: No/denies pain    Home Living                          Prior Function            PT Goals (current goals can now be found in the care plan section) Acute Rehab PT Goals Patient Stated Goal: Return Home Progress towards PT goals: Progressing toward goals    Frequency    Min 2X/week      PT Plan      Co-evaluation              AM-PAC PT "6 Clicks" Mobility   Outcome Measure  Help needed turning from your back to your side while in a flat bed without using bedrails?: A Little Help needed moving from lying on your back to sitting on the side of a flat bed without using bedrails?: A Little Help needed moving to and from a bed to a chair (including a wheelchair)?: A Little Help needed standing up from a chair using your arms (e.g., wheelchair or bedside chair)?: A Little Help needed to walk in hospital room?: A Little Help needed climbing 3-5 steps with a railing? : A Little 6 Click Score: 18    End of Session Equipment Utilized During Treatment: Gait belt Activity Tolerance: Patient tolerated treatment well Patient left: in chair;with call bell/phone within reach;with family/visitor present Nurse Communication: Mobility status PT Visit Diagnosis: Difficulty in walking, not elsewhere classified (R26.2)     Time: 8295-6213 PT Time Calculation (min) (ACUTE ONLY): 20 min  Charges:    $Gait Training: 8-22 mins PT General Charges $$ ACUTE PT VISIT: 1 Visit                     Glenford Lanes, PT, DPT Acute Rehabilitation Services Office: [phone] Secure Chat Preferred  Riva Chester 02/16/2024, 12:57 PM

## 2024-02-16 NOTE — Progress Notes (Signed)
   Cardiologist:  Oneal  Subjective:  Denies SSCP, palpitations or Dyspnea Feels better post op  Objective:  Vitals:   02/15/24 2300 02/16/24 0322 02/16/24 0534 02/16/24 0744  BP: 110/83 120/72 135/71 102/74  Pulse: 96 (!) 115 99   Resp: 18 18  16   Temp: 98.1 F (36.7 C) 98.1 F (36.7 C)  98.6 F (37 C)  TempSrc: Oral Oral  Axillary  SpO2:  90%    Weight:      Height:        Intake/Output from previous day:  Intake/Output Summary (Last 24 hours) at 02/16/2024 0847 Last data filed at 02/16/2024 0600 Gross per 24 hour  Intake 1350 ml  Output 1000 ml  Net 350 ml    Physical Exam: Elderly male Not septic appearing SEM and AR murmur Post lap choly with some tympany and bruising No edema Palpable pedal pulses   Lab Results: Basic Metabolic Panel: Recent Labs    02/15/24 0443 02/16/24 0727  NA 137 136  K 3.5 4.2  CL 104 106  CO2 21* 22  GLUCOSE 106* 160*  BUN 16 18  CREATININE 1.07 1.13  CALCIUM 8.4* 8.4*  MG 2.0 2.1  PHOS 2.6  --    Liver Function Tests: Recent Labs    02/15/24 0443 02/16/24 0727  AST 16 28  ALT 11 19  ALKPHOS 47 46  BILITOT 2.1* 1.2  PROT 5.7* 6.0*  ALBUMIN  2.5* 2.6*   No results for input(s): "LIPASE", "AMYLASE" in the last 72 hours. CBC: Recent Labs    02/15/24 0443 02/16/24 0727  WBC 7.1 8.2  HGB 15.0 15.0  HCT 43.8 44.0  MCV 87.8 88.4  PLT 207 237     Imaging: No results found.   Cardiac Studies:  ECG: afib rate 137 no acute changes    Telemetry: afib rates 100   Echo:   Medications:    docusate sodium   100 mg Oral BID   finasteride   5 mg Oral Daily   metoprolol  tartrate  50 mg Oral Q8H   probenecid   500 mg Oral Daily   tamsulosin   0.4 mg Oral Daily      diltiazem  (CARDIZEM ) infusion 10 mg/hr (02/16/24 0411)   heparin  1,350 Units/hr (02/15/24 2311)   piperacillin -tazobactam (ZOSYN )  IV 3.375 g (02/16/24 0513)    Assessment/Plan:   Post op doing well passing gas advance diet Surgery was a bit  complicated will ask Dr Lanell Pinta when eliquis  can be resumed  Afib: new onset April resume eliquis  when ok with surgery change to oral cardizem   GB:  on Zosyn  feels better post op advance diet  Aortic aneurysm:  4.3 by TTE 4.7 by CTA f/u CTA in 6 months   Janelle Mediate 02/16/2024, 8:47 AM

## 2024-02-16 NOTE — Plan of Care (Signed)
   Problem: Education: Goal: Knowledge of General Education information will improve Description Including pain rating scale, medication(s)/side effects and non-pharmacologic comfort measures Outcome: Progressing   Problem: Health Behavior/Discharge Planning: Goal: Ability to manage health-related needs will improve Outcome: Progressing

## 2024-02-16 NOTE — Progress Notes (Signed)
 1 Day Post-Op  Subjective: Feeling well this am. Pain well controlled. Tolerating solid diet.  Objective: Vital signs in last 24 hours: Temp:  [97.9 F (36.6 C)-98.6 F (37 C)] 98.6 F (37 C) (05/02 0744) Pulse Rate:  [66-115] 69 (05/02 0857) Resp:  [16-18] 16 (05/02 0744) BP: (102-135)/(71-99) 102/74 (05/02 0744) SpO2:  [90 %-97 %] 96 % (05/02 0857) Last BM Date : 02/15/24  Intake/Output from previous day: 05/01 0701 - 05/02 0700 In: 1350 [I.V.:1000; IV Piggyback:350] Out: 1000 [Urine:1000] Intake/Output this shift: Total I/O In: -  Out: 150 [Urine:150]  PE: Gen: NAD Lungs: respiratory effort nonlabored on room air Abd: soft, ND, mild expected TTP over incision and in RUQ. Incisions cdi  Lab Results:  Recent Labs    02/15/24 0443 02/16/24 0727  WBC 7.1 8.2  HGB 15.0 15.0  HCT 43.8 44.0  PLT 207 237   BMET Recent Labs    02/15/24 0443 02/16/24 0727  NA 137 136  K 3.5 4.2  CL 104 106  CO2 21* 22  GLUCOSE 106* 160*  BUN 16 18  CREATININE 1.07 1.13  CALCIUM 8.4* 8.4*   PT/INR No results for input(s): "LABPROT", "INR" in the last 72 hours. CMP     Component Value Date/Time   NA 136 02/16/2024 0727   NA 140 01/25/2024 1120   K 4.2 02/16/2024 0727   CL 106 02/16/2024 0727   CO2 22 02/16/2024 0727   GLUCOSE 160 (H) 02/16/2024 0727   BUN 18 02/16/2024 0727   BUN 29 (H) 01/25/2024 1120   CREATININE 1.13 02/16/2024 0727   CALCIUM 8.4 (L) 02/16/2024 0727   PROT 6.0 (L) 02/16/2024 0727   PROT 6.3 01/25/2024 1120   ALBUMIN  2.6 (L) 02/16/2024 0727   ALBUMIN  3.9 01/25/2024 1120   AST 28 02/16/2024 0727   ALT 19 02/16/2024 0727   ALKPHOS 46 02/16/2024 0727   BILITOT 1.2 02/16/2024 0727   BILITOT 1.4 (H) 01/25/2024 1120   GFRNONAA >60 02/16/2024 0727   GFRAA 71 09/18/2020 1441   Lipase     Component Value Date/Time   LIPASE 22 07/16/2023 0222       Studies/Results: No results found.   Anti-infectives: Anti-infectives (From admission,  onward)    Start     Dose/Rate Route Frequency Ordered Stop   02/13/24 0600  piperacillin -tazobactam (ZOSYN ) IVPB 3.375 g        3.375 g 12.5 mL/hr over 240 Minutes Intravenous Every 8 hours 02/12/24 2358     02/12/24 2345  piperacillin -tazobactam (ZOSYN ) IVPB 3.375 g        3.375 g 100 mL/hr over 30 Minutes Intravenous  Once 02/12/24 2334 02/13/24 0013        Assessment/Plan Acalculous cholecystitis with sludge POD 1 Laparoscopic subtotal stapled cholecystectomy Dr. Lanell Pinta 5/1 - tolerating diet and labs WNL - t bili now normal - pain controlled - hep gtt resumed. Can resume eliquis  as early as this afternoon -cards cleared for OR. Appreciate their evaluation - can discontinue abx  FEN - HH VTE - heparin  gtt ID - [email or web address]   A fib - eliquis  on hold, currently on dilt gtt.   HTN Aortic dilatation Aortic regurg Gout  I reviewed Consultant cardiology notes, hospitalist notes, last 24 h vitals and pain scores, last 48 h intake and output, last 24 h labs and trends, and last 24 h imaging results.   LOS: 3 days    Elwin Hammond , [facility] 02/16/2024,  11:49 AM Please see Amion for pager number during day hours 7:00am-4:30pm or 7:00am -11:30am on weekends

## 2024-02-16 NOTE — Progress Notes (Signed)
 Mobility Specialist Progress Note;   02/16/24 1053  Mobility  Activity Ambulated with assistance in hallway  Level of Assistance Contact guard assist, steadying assist  Assistive Device None  Distance Ambulated (ft) 300 ft  Activity Response Tolerated well  Mobility Referral Yes  Mobility visit 1 Mobility  Mobility Specialist Start Time (ACUTE ONLY) 1053  Mobility Specialist Stop Time (ACUTE ONLY) 1102  Mobility Specialist Time Calculation (min) (ACUTE ONLY) 9 min   Pt eager for mobility. Required MinG assistance during ambulation for safety. Pt adamant on not using RW this session. VSS throughout. Pt returned back to bed with all needs met, alarm on.   Janit Meline Mobility Specialist Please contact via SecureChat or [employer] [phone]

## 2024-02-16 NOTE — Plan of Care (Signed)

## 2024-02-16 NOTE — Progress Notes (Signed)
 [facility] Progress Note Patient: Cacey Bartz UJW:[id number] DOB: Aug 03, 1941 DOA: 02/12/2024  DOS: the patient was seen and examined on 02/16/2024  Brief Hospital Course: PMH of paroxysmal A-fib on Eliquis , HTN, gout presented to the hospital with complaints of abdominal pain and nausea and vomiting.  Found to have acute cholecystitis.  General surgery was consulted. Cardiology was consulted as well for preop clearance. Underwent lap chole on 5/1.  Assessment and Plan: Acute cholecystitis Presented with abdominal pain.  Ultrasound shows gallbladder wall thickening with sludge. General surgery consulted. HIDA scan is actually positive. Underwent lap chole on 5/1. Antibiotic discontinued.  IV heparin  discontinued and switched to oral Eliquis . Cardiology cleared the patient for surgery.   Paroxysmal A-fib with RVR Heart rate uncontrolled, in the 150s, started on Cardizem  drip,  Cardiology following.  Also on oral metoprolol . Management per cardiology.  On IV heparin .  Switch to Eliquis .  As needed Lopressor  added.   Hypertension Resume home dose metoprolol  On Cardizem .   BPH Continue with Flomax  and Proscar    Gout Continue with probenecid    Ascending aortic aneurysm measuring 4.7 cm diameter.   Cardiology recommend semi-annual imaging followup by CTA or MRA and referral to cardiothoracic surgery if not already obtained.    Subjective: No acute complaint.  No nausea no vomiting.  Pain well-controlled. Remains tachycardic on monitor.  Physical Exam: In mild distress. S1-S2 present. Bowel sounds present. Nontender.  No edema.  Data Reviewed: I have Reviewed nursing notes, Vitals, and Lab results. Reviewed CBC and BMP.  Reviewed her CBC and BMP.  Disposition: Status is: Inpatient Remains inpatient appropriate because: Still has RVR. apixaban  (ELIQUIS ) tablet 5 mg   Family Communication: Family at bedside Level of care: Progressive   Vitals:   02/16/24 1215 02/16/24  1450 02/16/24 1505 02/16/24 1946  BP:  111/79 111/73 102/65  Pulse: 90  100 (!) 102  Resp:  16  18  Temp:  98.7 F (37.1 C)  98 F (36.7 C)  TempSrc:  Oral  Oral  SpO2: 95% 97%  93%  Weight:      Height:         Author: Charlean Congress, MD 02/16/2024 8:12 PM  Please look on [email or web address] to find out who is on call.

## 2024-02-17 DIAGNOSIS — K810 Acute cholecystitis: Secondary | ICD-10-CM | POA: Diagnosis not present

## 2024-02-17 LAB — COMPREHENSIVE METABOLIC PANEL WITH GFR
ALT: 19 U/L (ref 0–44)
AST: 27 U/L (ref 15–41)
Albumin: 2.8 g/dL — ABNORMAL LOW (ref 3.5–5.0)
Alkaline Phosphatase: 48 U/L (ref 38–126)
Anion gap: 11 (ref 5–15)
BUN: 22 mg/dL (ref 8–23)
CO2: 24 mmol/L (ref 22–32)
Calcium: 8.7 mg/dL — ABNORMAL LOW (ref 8.9–10.3)
Chloride: 103 mmol/L (ref 98–111)
Creatinine, Ser: 1.14 mg/dL (ref 0.61–1.24)
GFR, Estimated: >60 mL/min (ref >60)
Glucose, Bld: 127 mg/dL — ABNORMAL HIGH (ref 70–99)
Potassium: 4.3 mmol/L (ref 3.5–5.1)
Sodium: 138 mmol/L (ref 135–145)
Total Bilirubin: 1 mg/dL (ref 0.0–1.2)
Total Protein: 5.9 g/dL — ABNORMAL LOW (ref 6.5–8.1)

## 2024-02-17 LAB — CBC WITH DIFFERENTIAL/PLATELET
Abs Immature Granulocytes: 0.03 K/uL (ref 0.00–0.07)
Basophils Absolute: 0 K/uL (ref 0.0–0.1)
Basophils Relative: 0 %
Eosinophils Absolute: 0 K/uL (ref 0.0–0.5)
Eosinophils Relative: 0 %
HCT: 46.3 % (ref 39.0–52.0)
Hemoglobin: 15.5 g/dL (ref 13.0–17.0)
Immature Granulocytes: 0 %
Lymphocytes Relative: 13 %
Lymphs Abs: 1.1 K/uL (ref 0.7–4.0)
MCH: 30 pg (ref 26.0–34.0)
MCHC: 33.5 g/dL (ref 30.0–36.0)
MCV: 89.6 fL (ref 80.0–100.0)
Monocytes Absolute: 0.9 K/uL (ref 0.1–1.0)
Monocytes Relative: 10 %
Neutro Abs: 6.9 K/uL (ref 1.7–7.7)
Neutrophils Relative %: 77 %
Platelets: 275 K/uL (ref 150–400)
RBC: 5.17 MIL/uL (ref 4.22–5.81)
RDW: 13.7 % (ref 11.5–15.5)
WBC: 8.9 K/uL (ref 4.0–10.5)
nRBC: 0 % (ref 0.0–0.2)

## 2024-02-17 LAB — MAGNESIUM: Magnesium: 2.3 mg/dL (ref 1.7–2.4)

## 2024-02-17 MED ORDER — METOPROLOL TARTRATE 50 MG PO TABS
75.0000 mg | ORAL_TABLET | Freq: Two times a day (BID) | ORAL | Status: DC
  Administered 2024-02-17 – 2024-02-18 (×2): 75 mg via ORAL
  Filled 2024-02-17 (×2): qty 1

## 2024-02-17 MED ORDER — METOPROLOL TARTRATE 5 MG/5ML IV SOLN
5.0000 mg | INTRAVENOUS | Status: DC | PRN
Start: 2024-02-17 — End: 2024-02-21
  Administered 2024-02-18 – 2024-02-19 (×4): 5 mg via INTRAVENOUS
  Filled 2024-02-17 (×4): qty 5

## 2024-02-17 NOTE — Progress Notes (Addendum)
   Cardiologist:  Oneal  Subjective:  Denies SSCP, palpitations or Dyspnea Feels better post op Has had BM  Objective:  Vitals:   02/16/24 1946 02/17/24 0002 02/17/24 0400 02/17/24 0700  BP: 102/65 107/70 (!) 133/95   Pulse: (!) 102 73 64   Resp: 18 20 16    Temp: 98 F (36.7 C) (!) 97.5 F (36.4 C) 97.6 F (36.4 C) 98.2 F (36.8 C)  TempSrc: Oral Oral Oral Oral  SpO2: 93% 95% 95%   Weight:      Height:        Intake/Output from previous day:  Intake/Output Summary (Last 24 hours) at 02/17/2024 0838 Last data filed at 02/17/2024 4098 Gross per 24 hour  Intake 1070 ml  Output 1030 ml  Net 40 ml    Physical Exam: Elderly male Not septic appearing SEM and AR murmur Post lap choly with some tympany and bruising No edema Palpable pedal pulses   Lab Results: Basic Metabolic Panel: Recent Labs    02/15/24 0443 02/16/24 0727 02/17/24 0410  NA 137 136 138  K 3.5 4.2 4.3  CL 104 106 103  CO2 21* 22 24  GLUCOSE 106* 160* 127*  BUN 16 18 22   CREATININE 1.07 1.13 1.14  CALCIUM 8.4* 8.4* 8.7*  MG 2.0 2.1 2.3  PHOS 2.6  --   --    Liver Function Tests: Recent Labs    02/16/24 0727 02/17/24 0410  AST 28 27  ALT 19 19  ALKPHOS 46 48  BILITOT 1.2 1.0  PROT 6.0* 5.9*  ALBUMIN  2.6* 2.8*   No results for input(s): "LIPASE", "AMYLASE" in the last 72 hours. CBC: Recent Labs    02/16/24 0727 02/17/24 0410  WBC 8.2 8.9  NEUTROABS  --  6.9  HGB 15.0 15.5  HCT 44.0 46.3  MCV 88.4 89.6  PLT 237 275     Imaging: No results found.   Cardiac Studies:  ECG: afib rate 137 no acute changes    Telemetry: afib rates 100   Echo:   Medications:    apixaban   5 mg Oral BID   docusate sodium   100 mg Oral BID   finasteride   5 mg Oral Daily   metoprolol  tartrate  50 mg Oral Q8H   probenecid   500 mg Oral Daily   tamsulosin   0.4 mg Oral Daily      diltiazem  (CARDIZEM ) infusion 10 mg/hr (02/17/24 0649)    Assessment/Plan:   Post op doing well minimal pain  moved bowels likely d/c  Afib: new onset April back on eliquis  and PO lopressor  GB:  on Zosyn   d/c feels better post op advance diet  Aortic aneurysm:  4.3 by TTE 4.7 by CTA f/u CTA in 6 months   Has f/u with Dr Elodia Hailstone  Cardiology will sign off   Janelle Mediate 02/17/2024, 8:38 AM

## 2024-02-17 NOTE — Plan of Care (Signed)

## 2024-02-17 NOTE — Progress Notes (Signed)
   02/17/24 1935  Assess: MEWS Score  Temp 98.5 F (36.9 C)  BP 130/87  MAP (mmHg) 101  Pulse Rate (!) 106  ECG Heart Rate (!) 134  Resp 20  SpO2 93 %  O2 Device Room Air  Assess: MEWS Score  MEWS Temp 0  MEWS Systolic 0  MEWS Pulse 3  MEWS RR 0  MEWS LOC 0  MEWS Score 3  MEWS Score Color Yellow  Assess: if the MEWS score is Yellow or Red  Were vital signs accurate and taken at a resting state? Yes  Does the patient meet 2 or more of the SIRS criteria? No  MEWS guidelines implemented  Yes, yellow  Treat  MEWS Interventions Considered administering scheduled or prn medications/treatments as ordered  Take Vital Signs  Increase Vital Sign Frequency  Yellow: Q2hr x1, continue Q4hrs until patient remains green for 12hrs  Escalate  MEWS: Escalate Yellow: Discuss with charge nurse and consider notifying provider and/or RRT  Notify: Charge Nurse/RN  Name of Charge Nurse/RN Notified Barkley Li, RN  Assess: SIRS CRITERIA  SIRS Temperature  0  SIRS Respirations  0  SIRS Pulse 1  SIRS WBC 0  SIRS Score Sum  1

## 2024-02-17 NOTE — Progress Notes (Signed)
 PROGRESS NOTE    Jose Hood  DGU:[id number] DOB: Sep 07, 1941 DOA: 02/12/2024 PCP: Ronna Coho, MD   Brief Narrative: 83 year old with past medical history significant for paroxysmal A-fib on Eliquis , hypertension, gout presented to the hospital complaining of abdominal pain, nausea and vomiting.  Found to have acute cholecystitis.  General surgery was consulted.  Cardiology was consulted as well for preop clearance.  Patient underwent laparoscopic cholecystectomy 5/1.  He has been having issues with A-fib with RVR.    Assessment & Plan:   Principal Problem:   Acute cholecystitis Active Problems:   Dyslipidemia   Essential hypertension   Paroxysmal atrial fibrillation (HCC)  Acute cholecystitis Presented with abdominal pain.  Ultrasound shows gallbladder wall thickening with sludge. General surgery consulted. HIDA scan actually positive. Underwent lap chole on 5/1. Antibiotic discontinued.  IV heparin  discontinued and switched to oral Eliquis . Cardiology cleared the patient for surgery.   Paroxysmal A-fib with RVR Heart rate uncontrolled, in the 150s, started on Cardizem  drip, subsequently discontinue.  Cardiology following.  Also on oral metoprolol . No back on  Eliquis .  As needed Lopressor  added. Metoprolol  increased to 75 mg BID today.   Hypertension On metoprolol     BPH Continue with Flomax  and Proscar    Gout Continue with probenecid    Ascending aortic aneurysm measuring 4.7 cm diameter.   Cardiology recommend semi-annual imaging followup by CTA or MRA and referral to cardiothoracic surgery if not already obtained.      Estimated body mass index is 25.07 kg/m as calculated from the following:   Height as of this encounter: 6\' 1"  (1.854 m).   Weight as of this encounter: 86.2 kg.   DVT prophylaxis: Eliquis  Code Status: Full code Family Communication: Wife at bedside.  Disposition Plan:  Status is: Inpatient Remains inpatient appropriate because:      Consultants:  Surgery   Procedures:  Laparoscopic subtotal stapled cholecystectomy on 5/1  Antimicrobials:    Subjective: Denies abdominal pain. He has not had BM since after Sx. He would like to walk today.    Objective: Vitals:   02/16/24 1505 02/16/24 1946 02/17/24 0002 02/17/24 0400  BP: 111/73 102/65 107/70 (!) 133/95  Pulse: 100 (!) 102 73 64  Resp:  18 20 16   Temp:  98 F (36.7 C) (!) 97.5 F (36.4 C) 97.6 F (36.4 C)  TempSrc:  Oral Oral Oral  SpO2:  93% 95% 95%  Weight:      Height:        Intake/Output Summary (Last 24 hours) at 02/17/2024 0709 Last data filed at 02/17/2024 0524 Gross per 24 hour  Intake 710 ml  Output 830 ml  Net -120 ml   Filed Weights   02/12/24 2122 02/13/24 1246 02/15/24 0720  Weight: 86.2 kg 85.7 kg 86.2 kg    Examination:  General exam: NAD Respiratory system: Clear to auscultation. Respiratory effort normal. Cardiovascular system: S1 & S2 heard, RRR. No JVD, murmurs, rubs, gallops or clicks. No pedal edema. Gastrointestinal system: Abdomen distend, no tender, multiples small incision.  Central nervous system: Alert and oriented. No focal neurological deficits. Extremities: Symmetric 5 x 5 power.   Data Reviewed: I have personally reviewed following labs and imaging studies  CBC: Recent Labs  Lab 02/12/24 2128 02/13/24 0543 02/14/24 0455 02/15/24 0443 02/16/24 0727 02/17/24 0410  WBC 8.9 8.8 7.0 7.1 8.2 8.9  NEUTROABS 6.8  --   --   --   --  6.9  HGB 16.9 16.5 15.3 15.0 15.0 15.5  HCT 50.4 47.9 45.1 43.8 44.0 46.3  MCV 90.0 87.1 88.3 87.8 88.4 89.6  PLT 174 182 184 207 237 275   Basic Metabolic Panel: Recent Labs  Lab 02/13/24 0543 02/14/24 0455 02/15/24 0443 02/16/24 0727 02/17/24 0410  NA 136 139 137 136 138  K 3.7 3.7 3.5 4.2 4.3  CL 104 103 104 106 103  CO2 23 23 21* 22 24  GLUCOSE 114* 97 106* 160* 127*  BUN 14 14 16 18 22   CREATININE 1.03 1.11 1.07 1.13 1.14  CALCIUM 8.4* 8.6* 8.4* 8.4*  8.7*  MG  --   --  2.0 2.1 2.3  PHOS  --   --  2.6  --   --    GFR: Estimated Creatinine Clearance: 55.5 mL/min (by C-G formula based on SCr of 1.14 mg/dL). Liver Function Tests: Recent Labs  Lab 02/13/24 0543 02/14/24 0455 02/15/24 0443 02/16/24 0727 02/17/24 0410  AST 16  17 16 16 28 27   ALT 11  12 11 11 19 19   ALKPHOS 47  43 49 47 46 48  BILITOT 3.3*  3.4* 2.6* 2.1* 1.2 1.0  PROT 6.0*  5.9* 5.6* 5.7* 6.0* 5.9*  ALBUMIN  2.9*  2.9* 2.6* 2.5* 2.6* 2.8*   No results for input(s): "LIPASE", "AMYLASE" in the last 168 hours. No results for input(s): "AMMONIA" in the last 168 hours. Coagulation Profile: No results for input(s): "INR", "PROTIME" in the last 168 hours. Cardiac Enzymes: No results for input(s): "CKTOTAL", "CKMB", "CKMBINDEX", "TROPONINI" in the last 168 hours. BNP (last 3 results) No results for input(s): "PROBNP" in the last 8760 hours. HbA1C: No results for input(s): "HGBA1C" in the last 72 hours. CBG: No results for input(s): "GLUCAP" in the last 168 hours. Lipid Profile: No results for input(s): "CHOL", "HDL", "LDLCALC", "TRIG", "CHOLHDL", "LDLDIRECT" in the last 72 hours. Thyroid  Function Tests: No results for input(s): "TSH", "T4TOTAL", "FREET4", "T3FREE", "THYROIDAB" in the last 72 hours. Anemia Panel: No results for input(s): "VITAMINB12", "FOLATE", "FERRITIN", "TIBC", "IRON", "RETICCTPCT" in the last 72 hours. Sepsis Labs: No results for input(s): "PROCALCITON", "LATICACIDVEN" in the last 168 hours.  No results found for this or any previous visit (from the past 240 hours).       Radiology Studies: No results found.      Scheduled Meds:  apixaban   5 mg Oral BID   docusate sodium   100 mg Oral BID   finasteride   5 mg Oral Daily   metoprolol  tartrate  50 mg Oral Q8H   probenecid   500 mg Oral Daily   tamsulosin   0.4 mg Oral Daily   Continuous Infusions:  diltiazem  (CARDIZEM ) infusion 10 mg/hr (02/16/24 1644)     LOS: 4 days     Time spent: 35 minutes    Hanni Milford A Vernice Mannina, MD [facility]   If 7PM-7AM, please contact night-coverage [email or web address]  02/17/2024, 7:09 AM

## 2024-02-18 DIAGNOSIS — K810 Acute cholecystitis: Secondary | ICD-10-CM | POA: Diagnosis not present

## 2024-02-18 LAB — CBC WITH DIFFERENTIAL/PLATELET
Abs Immature Granulocytes: 0.02 10*3/uL (ref 0.00–0.07)
Basophils Absolute: 0 10*3/uL (ref 0.0–0.1)
Basophils Relative: 0 %
Eosinophils Absolute: 0 10*3/uL (ref 0.0–0.5)
Eosinophils Relative: 0 %
HCT: 47.4 % (ref 39.0–52.0)
Hemoglobin: 15.8 g/dL (ref 13.0–17.0)
Immature Granulocytes: 0 %
Lymphocytes Relative: 21 %
Lymphs Abs: 1.4 10*3/uL (ref 0.7–4.0)
MCH: 29.6 pg (ref 26.0–34.0)
MCHC: 33.3 g/dL (ref 30.0–36.0)
MCV: 88.9 fL (ref 80.0–100.0)
Monocytes Absolute: 1 10*3/uL (ref 0.1–1.0)
Monocytes Relative: 15 %
Neutro Abs: 4.1 10*3/uL (ref 1.7–7.7)
Neutrophils Relative %: 64 %
Platelets: 261 10*3/uL (ref 150–400)
RBC: 5.33 MIL/uL (ref 4.22–5.81)
RDW: 13.6 % (ref 11.5–15.5)
WBC: 6.5 10*3/uL (ref 4.0–10.5)
nRBC: 0 % (ref 0.0–0.2)

## 2024-02-18 LAB — COMPREHENSIVE METABOLIC PANEL WITH GFR
ALT: 24 U/L (ref 0–44)
AST: 28 U/L (ref 15–41)
Albumin: 2.7 g/dL — ABNORMAL LOW (ref 3.5–5.0)
Alkaline Phosphatase: 41 U/L (ref 38–126)
Anion gap: 9 (ref 5–15)
BUN: 22 mg/dL (ref 8–23)
CO2: 25 mmol/L (ref 22–32)
Calcium: 9 mg/dL (ref 8.9–10.3)
Chloride: 106 mmol/L (ref 98–111)
Creatinine, Ser: 1.03 mg/dL (ref 0.61–1.24)
GFR, Estimated: >60 mL/min (ref >60)
Glucose, Bld: 105 mg/dL — ABNORMAL HIGH (ref 70–99)
Potassium: 4 mmol/L (ref 3.5–5.1)
Sodium: 140 mmol/L (ref 135–145)
Total Bilirubin: 0.9 mg/dL (ref 0.0–1.2)
Total Protein: 5.5 g/dL — ABNORMAL LOW (ref 6.5–8.1)

## 2024-02-18 LAB — MAGNESIUM: Magnesium: 2.1 mg/dL (ref 1.7–2.4)

## 2024-02-18 MED ORDER — METOPROLOL TARTRATE 100 MG PO TABS
100.0000 mg | ORAL_TABLET | Freq: Two times a day (BID) | ORAL | Status: DC
  Administered 2024-02-18 – 2024-02-21 (×6): 100 mg via ORAL
  Filled 2024-02-18 (×6): qty 1

## 2024-02-18 MED ORDER — POLYETHYLENE GLYCOL 3350 17 G PO PACK
17.0000 g | PACK | Freq: Every day | ORAL | Status: DC
  Administered 2024-02-18 – 2024-02-20 (×3): 17 g via ORAL
  Filled 2024-02-18 (×3): qty 1

## 2024-02-18 MED ORDER — BISACODYL 10 MG RE SUPP
10.0000 mg | Freq: Once | RECTAL | Status: AC
  Administered 2024-02-18: 10 mg via RECTAL
  Filled 2024-02-18 (×2): qty 1

## 2024-02-18 NOTE — Progress Notes (Signed)
   Cardiologist:  Oneal  Subjective:  Denies SSCP, palpitations or Dyspnea Feels better post op BM this am   Objective:  Vitals:   02/17/24 2333 02/18/24 0321 02/18/24 0846 02/18/24 0920  BP: 108/72 (!) 150/88 138/89 138/89  Pulse: (!) 59 89  (!) 135  Resp: 20 18 18    Temp: 98.1 F (36.7 C) 97.6 F (36.4 C) 97.9 F (36.6 C)   TempSrc: Oral Oral Oral   SpO2: 92% 94%    Weight:      Height:        Intake/Output from previous day:  Intake/Output Summary (Last 24 hours) at 02/18/2024 1012 Last data filed at 02/18/2024 0323 Gross per 24 hour  Intake 240 ml  Output 1550 ml  Net -1310 ml    Physical Exam: Elderly male Not septic appearing SEM and AR murmur Post lap choly with some tympany and bruising No edema Palpable pedal pulses   Lab Results: Basic Metabolic Panel: Recent Labs    02/17/24 0410 02/18/24 0241  NA 138 140  K 4.3 4.0  CL 103 106  CO2 24 25  GLUCOSE 127* 105*  BUN 22 22  CREATININE 1.14 1.03  CALCIUM 8.7* 9.0  MG 2.3 2.1   Liver Function Tests: Recent Labs    02/17/24 0410 02/18/24 0241  AST 27 28  ALT 19 24  ALKPHOS 48 41  BILITOT 1.0 0.9  PROT 5.9* 5.5*  ALBUMIN  2.8* 2.7*   No results for input(s): "LIPASE", "AMYLASE" in the last 72 hours. CBC: Recent Labs    02/17/24 0410 02/18/24 0241  WBC 8.9 6.5  NEUTROABS 6.9 4.1  HGB 15.5 15.8  HCT 46.3 47.4  MCV 89.6 88.9  PLT 275 261     Imaging: No results found.   Cardiac Studies:  ECG: afib rate 137 no acute changes    Telemetry: afib rates 100   Echo:   Medications:    apixaban   5 mg Oral BID   docusate sodium   100 mg Oral BID   finasteride   5 mg Oral Daily   metoprolol  tartrate  100 mg Oral BID   polyethylene glycol  17 g Oral Daily   probenecid   500 mg Oral Daily   tamsulosin   0.4 mg Oral Daily        Assessment/Plan:   Post op doing well minimal pain moved bowels likely d/c  Afib: new onset April back on eliquis  and PO lopressor  dose increased to 100  mg bid  GB:  on Zosyn   d/c feels better post op advance diet  Aortic aneurysm:  4.3 by TTE 4.7 by CTA f/u CTA in 6 months   Has f/u with Dr Elodia Hailstone  Cardiology will sign off   Janelle Mediate 02/18/2024, 10:12 AM

## 2024-02-18 NOTE — Progress Notes (Signed)
 PROGRESS NOTE    Jose Hood  ZOX:[id number] DOB: 11/06/1940 DOA: 02/12/2024 PCP: Ronna Coho, MD   Brief Narrative: 83 year old with past medical history significant for paroxysmal A-fib on Eliquis , hypertension, gout presented to the hospital complaining of abdominal pain, nausea and vomiting.  Found to have acute cholecystitis.  General surgery was consulted.  Cardiology was consulted as well for preop clearance.  Patient underwent laparoscopic cholecystectomy 5/1.  He has been having issues with A-fib with RVR.  HR still elevated, cardiology adjusting meds  Assessment & Plan:   Principal Problem:   Acute cholecystitis Active Problems:   Dyslipidemia   Essential hypertension   Paroxysmal atrial fibrillation (HCC)  Acute cholecystitis Presented with abdominal pain.  Ultrasound shows gallbladder wall thickening with sludge. General surgery consulted. HIDA scan actually positive. Underwent lap chole on 5/1. Antibiotic discontinued.  IV heparin  discontinued and switched to oral Eliquis . Cardiology cleared the patient for surgery. Tolerating diet, had BM  Paroxysmal A-fib with RVR Heart rate uncontrolled, in the 150s, started on Cardizem  drip, subsequently discontinue.  Cardiology following.  Also on oral metoprolol . No back on  Eliquis .  As needed Lopressor  added. Metoprolol  increased to 100 mg BID today.  HR still elevated 130--- per wife on ambulation HR increased up to 150--170 Monitor on increase dose today   Hypertension On metoprolol     BPH Continue with Flomax  and Proscar    Gout Continue with probenecid    Ascending aortic aneurysm measuring 4.7 cm diameter.   Cardiology recommend semi-annual imaging followup by CTA or MRA and referral to cardiothoracic surgery if not already obtained.      Estimated body mass index is 25.07 kg/m as calculated from the following:   Height as of this encounter: 6\' 1"  (1.854 m).   Weight as of this encounter: 86.2 kg.   DVT  prophylaxis: Eliquis  Code Status: Full code Family Communication: Wife at bedside.  Disposition Plan:  Status is: Inpatient Remains inpatient appropriate because:     Consultants:  Surgery   Procedures:  Laparoscopic subtotal stapled cholecystectomy on 5/1  Antimicrobials:    Subjective: No new complaints, feels well, had BM.  HR increases on ambulation.   Objective: Vitals:   02/18/24 0321 02/18/24 0846 02/18/24 0920 02/18/24 1200  BP: (!) 150/88 138/89 138/89 (!) 119/98  Pulse: 89  (!) 135   Resp: 18 18  18   Temp: 97.6 F (36.4 C) 97.9 F (36.6 C)  98.3 F (36.8 C)  TempSrc: Oral Oral  Oral  SpO2: 94%   98%  Weight:      Height:        Intake/Output Summary (Last 24 hours) at 02/18/2024 1424 Last data filed at 02/18/2024 0323 Gross per 24 hour  Intake 240 ml  Output 1550 ml  Net -1310 ml   Filed Weights   02/12/24 2122 02/13/24 1246 02/15/24 0720  Weight: 86.2 kg 85.7 kg 86.2 kg    Examination:  General exam: NAD Respiratory system: CTA Cardiovascular system: S 1, S 2 IRR Gastrointestinal system: BS present, soft, nt multiples small incision.  Central nervous system: Alert Extremities: No edema   Data Reviewed: I have personally reviewed following labs and imaging studies  CBC: Recent Labs  Lab 02/12/24 2128 02/13/24 0543 02/14/24 0455 02/15/24 0443 02/16/24 0727 02/17/24 0410 02/18/24 0241  WBC 8.9   < > 7.0 7.1 8.2 8.9 6.5  NEUTROABS 6.8  --   --   --   --  6.9 4.1  HGB 16.9   < >  15.3 15.0 15.0 15.5 15.8  HCT 50.4   < > 45.1 43.8 44.0 46.3 47.4  MCV 90.0   < > 88.3 87.8 88.4 89.6 88.9  PLT 174   < > 184 207 237 275 261   < > = values in this interval not displayed.   Basic Metabolic Panel: Recent Labs  Lab 02/14/24 0455 02/15/24 0443 02/16/24 0727 02/17/24 0410 02/18/24 0241  NA 139 137 136 138 140  K 3.7 3.5 4.2 4.3 4.0  CL 103 104 106 103 106  CO2 23 21* 22 24 25   GLUCOSE 97 106* 160* 127* 105*  BUN 14 16 18 22 22    CREATININE 1.11 1.07 1.13 1.14 1.03  CALCIUM 8.6* 8.4* 8.4* 8.7* 9.0  MG  --  2.0 2.1 2.3 2.1  PHOS  --  2.6  --   --   --    GFR: Estimated Creatinine Clearance: 61.4 mL/min (by C-G formula based on SCr of 1.03 mg/dL). Liver Function Tests: Recent Labs  Lab 02/14/24 0455 02/15/24 0443 02/16/24 0727 02/17/24 0410 02/18/24 0241  AST 16 16 28 27 28   ALT 11 11 19 19 24   ALKPHOS 49 47 46 48 41  BILITOT 2.6* 2.1* 1.2 1.0 0.9  PROT 5.6* 5.7* 6.0* 5.9* 5.5*  ALBUMIN  2.6* 2.5* 2.6* 2.8* 2.7*   No results for input(s): "LIPASE", "AMYLASE" in the last 168 hours. No results for input(s): "AMMONIA" in the last 168 hours. Coagulation Profile: No results for input(s): "INR", "PROTIME" in the last 168 hours. Cardiac Enzymes: No results for input(s): "CKTOTAL", "CKMB", "CKMBINDEX", "TROPONINI" in the last 168 hours. BNP (last 3 results) No results for input(s): "PROBNP" in the last 8760 hours. HbA1C: No results for input(s): "HGBA1C" in the last 72 hours. CBG: No results for input(s): "GLUCAP" in the last 168 hours. Lipid Profile: No results for input(s): "CHOL", "HDL", "LDLCALC", "TRIG", "CHOLHDL", "LDLDIRECT" in the last 72 hours. Thyroid  Function Tests: No results for input(s): "TSH", "T4TOTAL", "FREET4", "T3FREE", "THYROIDAB" in the last 72 hours. Anemia Panel: No results for input(s): "VITAMINB12", "FOLATE", "FERRITIN", "TIBC", "IRON", "RETICCTPCT" in the last 72 hours. Sepsis Labs: No results for input(s): "PROCALCITON", "LATICACIDVEN" in the last 168 hours.  No results found for this or any previous visit (from the past 240 hours).       Radiology Studies: No results found.      Scheduled Meds:  apixaban   5 mg Oral BID   docusate sodium   100 mg Oral BID   finasteride   5 mg Oral Daily   metoprolol  tartrate  100 mg Oral BID   polyethylene glycol  17 g Oral Daily   probenecid   500 mg Oral Daily   tamsulosin   0.4 mg Oral Daily   Continuous Infusions:     LOS:  5 days    Time spent: 35 minutes    Jose Hood A Jose Bogdanski, MD [facility]   If 7PM-7AM, please contact night-coverage [email or web address]  02/18/2024, 2:24 PM

## 2024-02-18 NOTE — Plan of Care (Signed)
   Problem: Education: Goal: Knowledge of General Education information will improve Description: Including pain rating scale, medication(s)/side effects and non-pharmacologic comfort measures Outcome: Progressing   Problem: Clinical Measurements: Goal: Respiratory complications will improve Outcome: Progressing   Problem: Activity: Goal: Risk for activity intolerance will decrease Outcome: Progressing

## 2024-02-18 NOTE — Progress Notes (Signed)
 3 Days Post-Op  Subjective: Some pain in the upper abdomen which is mild. Has not had a bowel movement in 3 days. Otherwise feeling well. He is not taking any pain medication.   Objective: Vital signs in last 24 hours: Temp:  [97.6 F (36.4 C)-98.5 F (36.9 C)] 97.6 F (36.4 C) (05/04 0321) Pulse Rate:  [37-114] 89 (05/04 0321) Resp:  [16-20] 18 (05/04 0321) BP: (107-156)/(69-101) 150/88 (05/04 0321) SpO2:  [91 %-98 %] 94 % (05/04 0321) Last BM Date : 02/15/24  Intake/Output from previous day: 05/03 0701 - 05/04 0700 In: 600 [P.O.:600] Out: 1750 [Urine:1750] Intake/Output this shift: Total I/O In: 240 [P.O.:240] Out: 1550 [Urine:1550]  PE: Gen: NAD Lungs: respiratory effort nonlabored on room air Abd: soft, ND, mild expected TTP over incisions and in RUQ. Incisions cdi with evolving ecchymosis around epigastric port site.   Lab Results:  Recent Labs    02/17/24 0410 02/18/24 0241  WBC 8.9 6.5  HGB 15.5 15.8  HCT 46.3 47.4  PLT 275 261   BMET Recent Labs    02/17/24 0410 02/18/24 0241  NA 138 140  K 4.3 4.0  CL 103 106  CO2 24 25  GLUCOSE 127* 105*  BUN 22 22  CREATININE 1.14 1.03  CALCIUM 8.7* 9.0   PT/INR No results for input(s): "LABPROT", "INR" in the last 72 hours. CMP     Component Value Date/Time   NA 140 02/18/2024 0241   NA 140 01/25/2024 1120   K 4.0 02/18/2024 0241   CL 106 02/18/2024 0241   CO2 25 02/18/2024 0241   GLUCOSE 105 (H) 02/18/2024 0241   BUN 22 02/18/2024 0241   BUN 29 (H) 01/25/2024 1120   CREATININE 1.03 02/18/2024 0241   CALCIUM 9.0 02/18/2024 0241   PROT 5.5 (L) 02/18/2024 0241   PROT 6.3 01/25/2024 1120   ALBUMIN  2.7 (L) 02/18/2024 0241   ALBUMIN  3.9 01/25/2024 1120   AST 28 02/18/2024 0241   ALT 24 02/18/2024 0241   ALKPHOS 41 02/18/2024 0241   BILITOT 0.9 02/18/2024 0241   BILITOT 1.4 (H) 01/25/2024 1120   GFRNONAA >60 02/18/2024 0241   GFRAA 71 09/18/2020 1441   Lipase     Component Value Date/Time    LIPASE 22 07/16/2023 0222       Studies/Results: No results found.   Anti-infectives: Anti-infectives (From admission, onward)    Start     Dose/Rate Route Frequency Ordered Stop   02/13/24 0600  piperacillin -tazobactam (ZOSYN ) IVPB 3.375 g  Status:  Discontinued        3.375 g 12.5 mL/hr over 240 Minutes Intravenous Every 8 hours 02/12/24 2358 02/16/24 1250   02/12/24 2345  piperacillin -tazobactam (ZOSYN ) IVPB 3.375 g        3.375 g 100 mL/hr over 30 Minutes Intravenous  Once 02/12/24 2334 02/13/24 0013        Assessment/Plan Acalculous cholecystitis with sludge POD 3 Laparoscopic subtotal stapled cholecystectomy Dr. Lanell Pinta 5/1 - tolerating diet and labs WNL - LFTs/t bili now normal - pain controlled on essentially no meds -hgb stable on eliquis  -constipation- add miralax and one time suppository ordered. Can escalate bowel regimen as needed if this is not successful  FEN - HH VTE - eliquis  ID - zosyn   A fib - eliquis , still working on rate control HTN Aortic dilatation Aortic regurg Gout  I reviewed Consultant cardiology notes, hospitalist notes, last 24 h vitals and pain scores, last 48 h intake and output, last  24 h labs and trends, and last 24 h imaging results.  Discharge at discretion of primary team/cardiology. Surgery team is available if needed.    LOS: 5 days    Adalberto Acton , MD [facility] 02/18/2024, 5:51 AM Please see Amion for pager number during day hours 7:00am-4:30pm or 7:00am -11:30am on weekends

## 2024-02-19 ENCOUNTER — Inpatient Hospital Stay (HOSPITAL_COMMUNITY)

## 2024-02-19 DIAGNOSIS — I4891 Unspecified atrial fibrillation: Secondary | ICD-10-CM

## 2024-02-19 DIAGNOSIS — K810 Acute cholecystitis: Secondary | ICD-10-CM | POA: Diagnosis not present

## 2024-02-19 LAB — ECHOCARDIOGRAM COMPLETE
AR max vel: 2.26 cm2
AV Area VTI: 2.63 cm2
AV Area mean vel: 2.25 cm2
AV Mean grad: 6 mmHg
AV Peak grad: 11.8 mmHg
Ao pk vel: 1.72 m/s
Area-P 1/2: 4.19 cm2
Calc EF: 41.8 %
Height: 73 in
P 1/2 time: 1632 ms
S' Lateral: 2.7 cm
Single Plane A2C EF: 44.8 %
Single Plane A4C EF: 46.1 %
Weight: 3040 [oz_av]

## 2024-02-19 LAB — CBC WITH DIFFERENTIAL/PLATELET
Abs Immature Granulocytes: 0.05 10*3/uL (ref 0.00–0.07)
Basophils Absolute: 0 10*3/uL (ref 0.0–0.1)
Basophils Relative: 0 %
Eosinophils Absolute: 0.1 10*3/uL (ref 0.0–0.5)
Eosinophils Relative: 2 %
HCT: 51.2 % (ref 39.0–52.0)
Hemoglobin: 17.2 g/dL — ABNORMAL HIGH (ref 13.0–17.0)
Immature Granulocytes: 1 %
Lymphocytes Relative: 25 %
Lymphs Abs: 1.8 10*3/uL (ref 0.7–4.0)
MCH: 29.6 pg (ref 26.0–34.0)
MCHC: 33.6 g/dL (ref 30.0–36.0)
MCV: 88 fL (ref 80.0–100.0)
Monocytes Absolute: 0.9 10*3/uL (ref 0.1–1.0)
Monocytes Relative: 13 %
Neutro Abs: 4.2 10*3/uL (ref 1.7–7.7)
Neutrophils Relative %: 59 %
Platelets: 275 10*3/uL (ref 150–400)
RBC: 5.82 MIL/uL — ABNORMAL HIGH (ref 4.22–5.81)
RDW: 13.7 % (ref 11.5–15.5)
WBC: 7.1 10*3/uL (ref 4.0–10.5)
nRBC: 0 % (ref 0.0–0.2)

## 2024-02-19 MED ORDER — DILTIAZEM LOAD VIA INFUSION
10.0000 mg | Freq: Once | INTRAVENOUS | Status: AC
  Administered 2024-02-19: 10 mg via INTRAVENOUS
  Filled 2024-02-19: qty 10

## 2024-02-19 MED ORDER — LACTATED RINGERS IV BOLUS
250.0000 mL | Freq: Once | INTRAVENOUS | Status: AC
  Administered 2024-02-19: 250 mL via INTRAVENOUS

## 2024-02-19 MED ORDER — METOPROLOL TARTRATE 12.5 MG HALF TABLET
12.5000 mg | ORAL_TABLET | Freq: Once | ORAL | Status: AC
  Administered 2024-02-19: 12.5 mg via ORAL
  Filled 2024-02-19: qty 1

## 2024-02-19 MED ORDER — DILTIAZEM HCL-DEXTROSE 125-5 MG/125ML-% IV SOLN (PREMIX)
5.0000 mg/h | INTRAVENOUS | Status: DC
  Administered 2024-02-19: 5 mg/h via INTRAVENOUS
  Administered 2024-02-19: 7.5 mg/h via INTRAVENOUS
  Administered 2024-02-20 – 2024-02-21 (×3): 15 mg/h via INTRAVENOUS
  Filled 2024-02-19 (×5): qty 125

## 2024-02-19 NOTE — Progress Notes (Signed)
 Medically stable for discharge from surgery standpoint. Follow-up appointment arranged. Please re-consult for questions/concerns.   Anda Bamberg, MD General and Trauma Surgery [facility]

## 2024-02-19 NOTE — Progress Notes (Signed)
 PROGRESS NOTE    Jose Hood  ZOX:[id number] DOB: 1941-03-06 DOA: 02/12/2024 PCP: Ronna Coho, MD   Brief Narrative: 83 year old with past medical history significant for paroxysmal A-fib on Eliquis , hypertension, gout presented to the hospital complaining of abdominal pain, nausea and vomiting.  Found to have acute cholecystitis.  General surgery was consulted.  Cardiology was consulted as well for preop clearance.  Patient underwent laparoscopic cholecystectomy 5/1.  He has been having issues with A-fib with RVR.  HR still elevated, cardiology adjusting meds.  Assessment & Plan:   Principal Problem:   Acute cholecystitis Active Problems:   Dyslipidemia   Essential hypertension   Paroxysmal atrial fibrillation (HCC)  Paroxysmal A-fib with RVR Cardiology following.  Input appreciated. Heart rate uncontrolled, in the 150s, started on Cardizem  drip, subsequently discontinued.  Metoprolol  dose increased. Cardizem  drip resumed on 02/19/2024 due to uncontrolled heart rate. - Continue Toprol , IV diltiazem . -Cardiology planning to transition to p.o. Cardizem  when appropriate.   Acute cholecystitis Presented with abdominal pain.  Ultrasound shows gallbladder wall thickening with sludge. General surgery consulted. HIDA scan actually positive. Underwent lap chole on 5/1. Antibiotic discontinued.  IV heparin  discontinued and switched to oral Eliquis . Tolerating diet, had BM. Patient has been cleared for discharge by surgery.  Hypertension On metoprolol . Also on Cardizem  gtt.   BPH Continue with Flomax  and Proscar    Gout Continue with probenecid    Ascending aortic aneurysm measuring 4.7 cm diameter.   Cardiology recommend semi-annual imaging followup by CTA or MRA and referral to cardiothoracic surgery if not already obtained.      Estimated body mass index is 25.07 kg/m as calculated from the following:   Height as of this encounter: 6\' 1"  (1.854 m).   Weight as of this  encounter: 86.2 kg.   DVT prophylaxis: Eliquis  Code Status: Full code Family Communication: Wife at bedside.  Disposition Plan:  Status is: Inpatient Remains inpatient appropriate because: A-fib with RVR remains uncontrolled and patient is on a diltiazem  gtt.   Consultants:  Surgery  Cardiology  Procedures:  Laparoscopic subtotal stapled cholecystectomy on 5/1   Subjective: No new complaints, feels well, had BM.  Heart rate has remained uncontrolled and patient has been restarted on a diltiazem  drip by cardiology.  Objective: Vitals:   02/19/24 1108 02/19/24 1138 02/19/24 1209 02/19/24 1239  BP: (!) 144/135 (!) 132/109 (!) 123/96 (!) 118/90  Pulse:      Resp:  19    Temp:  98.2 F (36.8 C)    TempSrc:  Oral    SpO2:  100%    Weight:      Height:        Intake/Output Summary (Last 24 hours) at 02/19/2024 1250 Last data filed at 02/19/2024 1243 Gross per 24 hour  Intake 578.17 ml  Output 975 ml  Net -396.83 ml   Filed Weights   02/12/24 2122 02/13/24 1246 02/15/24 0720  Weight: 86.2 kg 85.7 kg 86.2 kg     General: Alert, oriented X3  Eyes: Pupils equal, reactive  Oral cavity: moist mucous membranes  Head: Atraumatic, normocephalic  Neck: supple  Chest: clear to auscultation. No crackles, no wheezes  CVS: S1,S2 RRR. No murmurs  Abd: No distention, soft, non-tender. No masses palpable  Extr: No edema   MSK: No joint deformities or swelling  Neurological: Grossly intact.    Data Reviewed: I have personally reviewed following labs and imaging studies  CBC: Recent Labs  Lab 02/12/24 2128 02/13/24 0543 02/15/24 0443 02/16/24 8119  02/17/24 0410 02/18/24 0241 02/19/24 0552  WBC 8.9   < > 7.1 8.2 8.9 6.5 7.1  NEUTROABS 6.8  --   --   --  6.9 4.1 4.2  HGB 16.9   < > 15.0 15.0 15.5 15.8 17.2*  HCT 50.4   < > 43.8 44.0 46.3 47.4 51.2  MCV 90.0   < > 87.8 88.4 89.6 88.9 88.0  PLT 174   < > 207 237 275 261 275   < > = values in this interval not displayed.    Basic Metabolic Panel: Recent Labs  Lab 02/14/24 0455 02/15/24 0443 02/16/24 0727 02/17/24 0410 02/18/24 0241  NA 139 137 136 138 140  K 3.7 3.5 4.2 4.3 4.0  CL 103 104 106 103 106  CO2 23 21* 22 24 25   GLUCOSE 97 106* 160* 127* 105*  BUN 14 16 18 22 22   CREATININE 1.11 1.07 1.13 1.14 1.03  CALCIUM 8.6* 8.4* 8.4* 8.7* 9.0  MG  --  2.0 2.1 2.3 2.1  PHOS  --  2.6  --   --   --    GFR: Estimated Creatinine Clearance: 61.4 mL/min (by C-G formula based on SCr of 1.03 mg/dL). Liver Function Tests: Recent Labs  Lab 02/14/24 0455 02/15/24 0443 02/16/24 0727 02/17/24 0410 02/18/24 0241  AST 16 16 28 27 28   ALT 11 11 19 19 24   ALKPHOS 49 47 46 48 41  BILITOT 2.6* 2.1* 1.2 1.0 0.9  PROT 5.6* 5.7* 6.0* 5.9* 5.5*  ALBUMIN  2.6* 2.5* 2.6* 2.8* 2.7*   No results for input(s): "LIPASE", "AMYLASE" in the last 168 hours. No results for input(s): "AMMONIA" in the last 168 hours. Coagulation Profile: No results for input(s): "INR", "PROTIME" in the last 168 hours. Cardiac Enzymes: No results for input(s): "CKTOTAL", "CKMB", "CKMBINDEX", "TROPONINI" in the last 168 hours. BNP (last 3 results) No results for input(s): "PROBNP" in the last 8760 hours. HbA1C: No results for input(s): "HGBA1C" in the last 72 hours. CBG: No results for input(s): "GLUCAP" in the last 168 hours. Lipid Profile: No results for input(s): "CHOL", "HDL", "LDLCALC", "TRIG", "CHOLHDL", "LDLDIRECT" in the last 72 hours. Thyroid  Function Tests: No results for input(s): "TSH", "T4TOTAL", "FREET4", "T3FREE", "THYROIDAB" in the last 72 hours. Anemia Panel: No results for input(s): "VITAMINB12", "FOLATE", "FERRITIN", "TIBC", "IRON", "RETICCTPCT" in the last 72 hours. Sepsis Labs: No results for input(s): "PROCALCITON", "LATICACIDVEN" in the last 168 hours.  No results found for this or any previous visit (from the past 240 hours).       Radiology Studies: No results found.      Scheduled Meds:   apixaban   5 mg Oral BID   docusate sodium   100 mg Oral BID   finasteride   5 mg Oral Daily   metoprolol  tartrate  100 mg Oral BID   polyethylene glycol  17 g Oral Daily   probenecid   500 mg Oral Daily   tamsulosin   0.4 mg Oral Daily   Continuous Infusions:  diltiazem  (CARDIZEM ) infusion 10 mg/hr (02/19/24 1243)      LOS: 6 days    Time spent: 35 minutes    MDALA-GAUSI, Glyn Zendejas AGATHA, MD [facility]   If 7PM-7AM, please contact night-coverage [email or web address]  02/19/2024, 12:50 PM

## 2024-02-19 NOTE — Plan of Care (Signed)

## 2024-02-19 NOTE — Progress Notes (Signed)
  Echocardiogram 2D Echocardiogram has been performed.  Royden Corin 02/19/2024, 2:37 PM

## 2024-02-19 NOTE — TOC Progression Note (Signed)
 Transition of Care [facility]) - Progression Note    Patient Details  Name: Jose Hood MRN: [id number] Date of Birth: Aug 02, 1941  Transition of Care [facility]) CM/SW Contact  Graves-Bigelow, Jari Merles, RN Phone Number: 02/19/2024, 11:11 AM  Clinical Narrative:  Patient was discussed in progression rounds. Post lap chole 02-15-24. Case Manager will continue to follow for additional transition of care needs. Referral submitted to [facility] PT.      Expected Discharge Plan: Home w Home Health Services Barriers to Discharge: No Barriers Identified  Expected Discharge Plan and Services In-house Referral: NA Discharge Planning Services: CM Consult Post Acute Care Choice: Home Health Living arrangements for the past 2 months: Single Family Home                           HH Arranged: PT HH Agency: [facility] Date HH Agency Contacted: 02/14/24 Time HH Agency Contacted: 1206 Representative spoke with at [facility] Agency: Loetta Ringer   Social Determinants of Health (SDOH) Interventions SDOH Screenings   Food Insecurity: No Food Insecurity (02/13/2024)  Housing: Low Risk  (02/13/2024)  Transportation Needs: No Transportation Needs (02/13/2024)  Utilities: Not At Risk (02/13/2024)  Social Connections: Moderately Integrated (02/13/2024)  Tobacco Use: Low Risk  (02/15/2024)    Readmission Risk Interventions     No data to display

## 2024-02-19 NOTE — Progress Notes (Signed)
 Patient's heart rate while lying in bed resting 110s-140s, as high as 160 nonsustained.  Patient has had increased dose of 100 mg po metoprolol  and prn metoprolol  per orders and parameters.  Patient remains asymptomatic with BP 157/88.  Dr. Del Favia notified.

## 2024-02-19 NOTE — Progress Notes (Addendum)
 Rounding Note    Patient Name: Jose Hood Date of Encounter: 02/19/2024  [facility] Cardiologist: Oneil Bigness, MD   Subjective   Patient is in rapid atrial fibrillation this morning with rates as hight as the 160s to 170s. However, he is completely asymptomatic with this from a cardiac standpoint. No chest pain, shortness of breath, palpitations, lightheadedness, or dizziness. He has some mild abdominal discomfort but much improved from admission after having lap chole.  Inpatient Medications    Scheduled Meds:  apixaban   5 mg Oral BID   docusate sodium   100 mg Oral BID   finasteride   5 mg Oral Daily   metoprolol  tartrate  100 mg Oral BID   polyethylene glycol  17 g Oral Daily   probenecid   500 mg Oral Daily   tamsulosin   0.4 mg Oral Daily   Continuous Infusions:  PRN Meds: acetaminophen , metoprolol  tartrate, morphine  injection, ondansetron  **OR** ondansetron  (ZOFRAN ) IV, mouth rinse, oxyCODONE    Vital Signs    Vitals:   02/19/24 0438 02/19/24 0506 02/19/24 0815 02/19/24 0854  BP: (!) 169/155 128/68 (!) 153/112 (!) 153/112  Pulse: (!) 52 (!) 134 82 (!) 181  Resp: 20     Temp: (!) 97.5 F (36.4 C)  98.5 F (36.9 C)   TempSrc: Oral  Oral   SpO2: 97% 96% 97%   Weight:      Height:        Intake/Output Summary (Last 24 hours) at 02/19/2024 0907 Last data filed at 02/19/2024 0800 Gross per 24 hour  Intake 551.64 ml  Output 975 ml  Net -423.36 ml      02/15/2024    7:20 AM 02/13/2024   12:46 PM 02/12/2024    9:22 PM  Last 3 Weights  Weight (lbs) 190 lb 188 lb 14.4 oz 190 lb  Weight (kg) 86.183 kg 85.684 kg 86.183 kg      Telemetry    Atrial fibrillation with rates in the 130s to 170s. - Personally Reviewed  ECG    No new ECG tracing today. - Personally Reviewed  Physical Exam   GEN: 83 year old Caucasian male resting comfortably in no acute distress.   Neck: No JVD. Cardiac: Tachycardic with irregularly irregular rhythm. No murmurs, rubs,  or gallops.  Respiratory: Clear to auscultation bilaterally. No wheezes, rhonchi, or rales. GI: Soft, non-distended, and non-tender. MS: No lower extremity edema. No deformity. Skin: Warm and dry. Neuro:  No focal deficits. Psych: Normal affect. Responds appropriately.   Labs    High Sensitivity Troponin:   Recent Labs  Lab 02/12/24 2128  TROPONINIHS 10     Chemistry Recent Labs  Lab 02/16/24 0727 02/17/24 0410 02/18/24 0241  NA 136 138 140  K 4.2 4.3 4.0  CL 106 103 106  CO2 22 24 25   GLUCOSE 160* 127* 105*  BUN 18 22 22   CREATININE 1.13 1.14 1.03  CALCIUM 8.4* 8.7* 9.0  MG 2.1 2.3 2.1  PROT 6.0* 5.9* 5.5*  ALBUMIN  2.6* 2.8* 2.7*  AST 28 27 28   ALT 19 19 24   ALKPHOS 46 48 41  BILITOT 1.2 1.0 0.9  GFRNONAA >60 >60 >60  ANIONGAP 8 11 9     Lipids No results for input(s): "CHOL", "TRIG", "HDL", "LABVLDL", "LDLCALC", "CHOLHDL" in the last 168 hours.  Hematology Recent Labs  Lab 02/17/24 0410 02/18/24 0241 02/19/24 0552  WBC 8.9 6.5 7.1  RBC 5.17 5.33 5.82*  HGB 15.5 15.8 17.2*  HCT 46.3 47.4  51.2  MCV 89.6 88.9 88.0  MCH 30.0 29.6 29.6  MCHC 33.5 33.3 33.6  RDW 13.7 13.6 13.7  PLT 275 261 275   Thyroid  No results for input(s): "TSH", "FREET4" in the last 168 hours.  BNP Recent Labs  Lab 02/12/24 2127  BNP 96.9    DDimer No results for input(s): "DDIMER" in the last 168 hours.   Radiology    No results found.  Cardiac Studies   Echocardiogram 11/11/2022: Impressions: 1. Left ventricular ejection fraction, by estimation, is 60 to 65%. The  left ventricle has normal function. The left ventricle has no regional  wall motion abnormalities. Left ventricular diastolic parameters are  consistent with Grade I diastolic  dysfunction (impaired relaxation).   2. Right ventricular systolic function is normal. The right ventricular  size is normal. There is normal pulmonary artery systolic pressure.   3. The mitral valve is normal in structure. No  evidence of mitral valve  regurgitation. No evidence of mitral stenosis.   4. The aortic valve is tricuspid. There is moderate calcification of the  aortic valve. There is moderate thickening of the aortic valve. Aortic  valve regurgitation is moderate. Aortic valve sclerosis is present, with  no evidence of aortic valve  stenosis. Aortic regurgitation PHT measures 572 msec. Aortic valve area,  by VTI measures 3.27 cm. Aortic valve mean gradient measures 9.0 mmHg.  Aortic valve Vmax measures 2.07 m/s.   5. Pulmonic valve regurgitation is moderate.   6. Aortic dilatation noted. There is mild dilatation of the aortic root,  measuring 43 mm. There is moderate dilatation of the ascending aorta,  measuring 45 mm.   7. The inferior vena cava is normal in size with greater than 50%  respiratory variability, suggesting right atrial pressure of 3 mmHg.    Patient Profile     83 y.o. male with a history of persistent atrial fibrillation on Eliquis , dilated ascending aorta, hypertension, BPH, gout, and shingles in 2017 who was admitted on 02/12/2024 for acute cholecystis after presenting with abdominal pain. Cardiology was consulted for pre-op evaluation and for assistance with management of atrial fibrillation. He underwent a laparoscopic cholecystectomy on 02/15/2024.  Assessment & Plan    Persistent Atrial Fibrillation Initially diagnosed at office visit on 01/25/2024. Rate control strategy was recommended given he was completely asymptomatic.  - Rates in the 130s to 170s this morning. Asymptomatic with this. - Potassium 4.0 and Magnesium  2.1 yesterday. - TSH normal last month. - He is scheduled to have an outpatient Echo later this month. However, will cancel this and order as an inpatient. - Patient on Lopressor  25mg  twice daily at home. However, this was increased to 100mg  twice daily here in the hospital. Continue.  - Will start IV Diltiazem  with bolus this morning given significant RVR. If EF  is down, will need to avoid calcium channel blockers.  - CHA2DS2-VASc = 4 (vascular disease, HTN, age x2). Continue Eliquis  5mg  twice daily.  - If unable to control heart rates, will need to consider TEE/ DCCV this admission.  Hypertension BP elevated at times.  - Continue Lopressor  100mg  twice daily. - Will start IV Diltiazem  as given atrial fibrillation with RVR. - Patient on Amlodipine 10mg  daily and Lisinopril  40mg  daily at home but these were held on admission. Continue to hold to allow for additional rate control.  Ascending Aortic Aneurysm CTA this admission showed a 4.7cm ascending aortic aneurysm.  - Continue beta-blocker as above. - Will need semi-annual imaging with  CTA/ MRA. - Recommend outpatient CT surgery referral.   Otherwise, per primary team: - Acute cholecystitis  - BPH - Gout   For questions or updates, please contact [facility] Please consult [email or web address] for contact info under        Signed, Callie E Goodrich, PA-C  02/19/2024, 9:07 AM     Attending Note:   The patient was seen and examined.  Agree with assessment and plan as noted above.  Changes made to the above note as needed.  Patient seen and independently examined with Sharren Decree, PA.   We discussed all aspects of the encounter. I agree with the assessment and plan as stated above.    Atrial fib with RVR:  HR remains elevated.  Continue metoprolol  100 mg BID ,  Dilt drip .  Will convert the Dilt Drip to PO  as his rate slows / improves.   Echo today .      I have spent a total of 40 minutes with patient reviewing hospital  notes , telemetry, EKGs, labs and examining patient as well as establishing an assessment and plan that was discussed with the patient.  > 50% of time was spent in direct patient care.    [place], Marieta Shorten., MD, [facility] 02/19/2024, 9:34 AM [address]  [address] Office [phone] Pager [phone]

## 2024-02-20 DIAGNOSIS — I5020 Unspecified systolic (congestive) heart failure: Secondary | ICD-10-CM

## 2024-02-20 DIAGNOSIS — I4819 Other persistent atrial fibrillation: Secondary | ICD-10-CM

## 2024-02-20 DIAGNOSIS — K810 Acute cholecystitis: Secondary | ICD-10-CM | POA: Diagnosis not present

## 2024-02-20 LAB — BASIC METABOLIC PANEL WITH GFR
Anion gap: 10 (ref 5–15)
BUN: 32 mg/dL — ABNORMAL HIGH (ref 8–23)
CO2: 22 mmol/L (ref 22–32)
Calcium: 8.6 mg/dL — ABNORMAL LOW (ref 8.9–10.3)
Chloride: 105 mmol/L (ref 98–111)
Creatinine, Ser: 1.09 mg/dL (ref 0.61–1.24)
GFR, Estimated: >60 mL/min (ref >60)
Glucose, Bld: 107 mg/dL — ABNORMAL HIGH (ref 70–99)
Potassium: 4.1 mmol/L (ref 3.5–5.1)
Sodium: 137 mmol/L (ref 135–145)

## 2024-02-20 LAB — CBC
HCT: 50 % (ref 39.0–52.0)
Hemoglobin: 17.2 g/dL — ABNORMAL HIGH (ref 13.0–17.0)
MCH: 30 pg (ref 26.0–34.0)
MCHC: 34.4 g/dL (ref 30.0–36.0)
MCV: 87.3 fL (ref 80.0–100.0)
Platelets: 272 10*3/uL (ref 150–400)
RBC: 5.73 MIL/uL (ref 4.22–5.81)
RDW: 13.8 % (ref 11.5–15.5)
WBC: 7.5 10*3/uL (ref 4.0–10.5)
nRBC: 0 % (ref 0.0–0.2)

## 2024-02-20 LAB — MAGNESIUM: Magnesium: 2.1 mg/dL (ref 1.7–2.4)

## 2024-02-20 MED ORDER — SODIUM CHLORIDE 0.9% FLUSH
3.0000 mL | Freq: Two times a day (BID) | INTRAVENOUS | Status: DC
Start: 2024-02-20 — End: 2024-02-21
  Administered 2024-02-20 – 2024-02-21 (×2): 10 mL via INTRAVENOUS

## 2024-02-20 MED ORDER — SODIUM CHLORIDE 0.9% FLUSH: 3.0000 mL | INTRAVENOUS | Status: DC | PRN

## 2024-02-20 NOTE — H&P (View-Only) (Signed)
 Rounding Note    Patient Name: Jose Hood Date of Encounter: 02/20/2024  [facility] Cardiologist: Oneil Bigness, MD   Subjective   He remains in atrial fibrillation with rates in the 130s to 150s. However, he remains asymptomatic with this. He denies any chest pain, shortness of breath, palpitations, lightheadedness, or dizziness but he has not been ambulating much.  Inpatient Medications    Scheduled Meds:  apixaban   5 mg Oral BID   docusate sodium   100 mg Oral BID   finasteride   5 mg Oral Daily   metoprolol  tartrate  100 mg Oral BID   polyethylene glycol  17 g Oral Daily   probenecid   500 mg Oral Daily   tamsulosin   0.4 mg Oral Daily   Continuous Infusions:  diltiazem  (CARDIZEM ) infusion 7.5 mg/hr (02/19/24 2202)   PRN Meds: acetaminophen , metoprolol  tartrate, morphine  injection, ondansetron  **OR** ondansetron  (ZOFRAN ) IV, mouth rinse, oxyCODONE    Vital Signs    Vitals:   02/19/24 1717 02/19/24 2033 02/19/24 2312 02/20/24 0452  BP: (!) 126/94 117/71 108/63 126/80  Pulse: 86 98 79 100  Resp: 18 18 19 18   Temp: 98 F (36.7 C) 98 F (36.7 C) (!) 97.5 F (36.4 C) 98 F (36.7 C)  TempSrc: Oral Oral Oral Oral  SpO2:  97% 99% 96%  Weight:      Height:        Intake/Output Summary (Last 24 hours) at 02/20/2024 0742 Last data filed at 02/20/2024 0452 Gross per 24 hour  Intake 416.6 ml  Output 830 ml  Net -413.4 ml      02/15/2024    7:20 AM 02/13/2024   12:46 PM 02/12/2024    9:22 PM  Last 3 Weights  Weight (lbs) 190 lb 188 lb 14.4 oz 190 lb  Weight (kg) 86.183 kg 85.684 kg 86.183 kg      Telemetry    Atrial fibrillation with rates in th 110s to 150s at rest. - Personally Reviewed  ECG    No new ECG tracing today. - Personally Reviewed  Physical Exam   GEN: 83 year old Caucasian male resting comfortably in no acute distress.   Neck: No JVD. Cardiac: Tachycardic with irregularly irregular rhythm. No significant murmurs, rubs, or gallops.   Respiratory: Clear to auscultation bilaterally. No wheezes, rhonchi, or rales. MS: No lower extremity edema. No deformity. Skin: Warm and dry. Neuro:  No focal deficits. Psych: Normal affect. Responds appropriately.   Labs    High Sensitivity Troponin:   Recent Labs  Lab 02/12/24 2128  TROPONINIHS 10     Chemistry Recent Labs  Lab 02/16/24 0727 02/17/24 0410 02/18/24 0241 02/20/24 0450  NA 136 138 140 137  K 4.2 4.3 4.0 4.1  CL 106 103 106 105  CO2 22 24 25 22   GLUCOSE 160* 127* 105* 107*  BUN 18 22 22  32*  CREATININE 1.13 1.14 1.03 1.09  CALCIUM 8.4* 8.7* 9.0 8.6*  MG 2.1 2.3 2.1 2.1  PROT 6.0* 5.9* 5.5*  --   ALBUMIN  2.6* 2.8* 2.7*  --   AST 28 27 28   --   ALT 19 19 24   --   ALKPHOS 46 48 41  --   BILITOT 1.2 1.0 0.9  --   GFRNONAA >60 >60 >60 >60  ANIONGAP 8 11 9 10     Lipids No results for input(s): "CHOL", "TRIG", "HDL", "LABVLDL", "LDLCALC", "CHOLHDL" in the last 168 hours.  Hematology Recent Labs  Lab 02/18/24 [phone]  02/19/24 0552 02/20/24 0450  WBC 6.5 7.1 7.5  RBC 5.33 5.82* 5.73  HGB 15.8 17.2* 17.2*  HCT 47.4 51.2 50.0  MCV 88.9 88.0 87.3  MCH 29.6 29.6 30.0  MCHC 33.3 33.6 34.4  RDW 13.6 13.7 13.8  PLT 261 275 272   Thyroid  No results for input(s): "TSH", "FREET4" in the last 168 hours.  BNPNo results for input(s): "BNP", "PROBNP" in the last 168 hours.  DDimer No results for input(s): "DDIMER" in the last 168 hours.   Radiology    ECHOCARDIOGRAM COMPLETE Result Date: 02/19/2024    ECHOCARDIOGRAM REPORT   Patient Name:   Jose Hood Date of Exam: 02/19/2024 Medical Rec #:  [id number]  Height:       73.0 in Accession #:    [id number] Weight:       190.0 lb Date of Birth:  1941-10-02   BSA:          2.105 m Patient Age:    82 years   BP:           121/84 mmHg Patient Gender: M          HR:           93 bpm. Exam Location:  Inpatient Procedure: 2D Echo, Cardiac Doppler and Color Doppler (Both Spectral and Color            Flow Doppler were utilized  during procedure). Indications:    I48.91* Unspeicified atrial fibrillation  History:        Patient has prior history of Echocardiogram examinations, most                 recent 11/11/2022. Abnormal ECG, Aortic Valve Disease,                 Arrythmias:Atrial Fibrillation; Risk Factors:Hypertension and                 Dyslipidemia.  Sonographer:    Raynelle Callow RDCS Referring Phys: [id number] Casimer Clear  Sonographer Comments: Technically difficult study due to poor echo windows. Image acquisition challenging due to respiratory motion. IMPRESSIONS  1. Left ventricular ejection fraction, by estimation, is 45 to 50%. The left ventricle has mildly decreased function. The left ventricle demonstrates global hypokinesis. There is moderate left ventricular hypertrophy. Left ventricular diastolic parameters are indeterminate.  2. Right ventricular systolic function is moderately reduced. The right ventricular size is moderately enlarged. Mildly increased right ventricular wall thickness. There is mildly elevated pulmonary artery systolic pressure.  3. The mitral valve is grossly normal. Mild mitral valve regurgitation. No evidence of mitral stenosis.  4. Tricuspid valve regurgitation is mild to moderate.  5. The aortic valve is calcified. There is severe calcifcation of the aortic valve. Aortic valve regurgitation is mild. Aortic valve sclerosis is present, with no evidence of aortic valve stenosis.  6. Aortic dilatation noted. There is moderate dilatation of the ascending aorta, measuring 45 mm. There is mild dilatation of the aortic root, measuring 40 mm.  7. The inferior vena cava is dilated in size with <50% respiratory variability, suggesting right atrial pressure of 15 mmHg. Comparison(s): Changes from prior study are noted. Now in afib, EF mildly reduced. FINDINGS  Left Ventricle: Left ventricular ejection fraction, by estimation, is 45 to 50%. The left ventricle has mildly decreased function. The left ventricle  demonstrates global hypokinesis. The left ventricular internal cavity size was normal in size. There is  moderate left ventricular hypertrophy. Left ventricular diastolic function could  not be evaluated due to atrial fibrillation. Left ventricular diastolic parameters are indeterminate. Right Ventricle: The right ventricular size is moderately enlarged. Mildly increased right ventricular wall thickness. Right ventricular systolic function is moderately reduced. There is mildly elevated pulmonary artery systolic pressure. The tricuspid regurgitant velocity is 2.34 m/s, and with an assumed right atrial pressure of 15 mmHg, the estimated right ventricular systolic pressure is 36.9 mmHg. Left Atrium: Left atrial size was normal in size. Right Atrium: Right atrial size was normal in size. Pericardium: There is no evidence of pericardial effusion. Mitral Valve: The mitral valve is grossly normal. Mild mitral valve regurgitation. No evidence of mitral valve stenosis. Tricuspid Valve: The tricuspid valve is normal in structure. Tricuspid valve regurgitation is mild to moderate. No evidence of tricuspid stenosis. Aortic Valve: The aortic valve is calcified. There is severe calcifcation of the aortic valve. Aortic valve regurgitation is mild. Aortic regurgitation PHT measures 1632 msec. Aortic valve sclerosis is present, with no evidence of aortic valve stenosis. Aortic valve mean gradient measures 6.0 mmHg. Aortic valve peak gradient measures 11.8 mmHg. Aortic valve area, by VTI measures 2.63 cm. Pulmonic Valve: The pulmonic valve was normal in structure. Pulmonic valve regurgitation is trivial. No evidence of pulmonic stenosis. Aorta: Aortic dilatation noted. There is moderate dilatation of the ascending aorta, measuring 45 mm. There is mild dilatation of the aortic root, measuring 40 mm. Venous: The inferior vena cava is dilated in size with less than 50% respiratory variability, suggesting right atrial pressure of 15  mmHg. IAS/Shunts: No atrial level shunt detected by color flow Doppler.  LEFT VENTRICLE PLAX 2D LVIDd:         3.60 cm LVIDs:         2.70 cm LV PW:         1.40 cm LV IVS:        1.40 cm LVOT diam:     2.40 cm LV SV:         63 LV SV Index:   30 LVOT Area:     4.52 cm  LV Volumes (MOD) LV vol d, MOD A2C: 49.3 ml LV vol d, MOD A4C: 66.1 ml LV vol s, MOD A2C: 27.2 ml LV vol s, MOD A4C: 35.6 ml LV SV MOD A2C:     22.1 ml LV SV MOD A4C:     66.1 ml LV SV MOD BP:      24.2 ml RIGHT VENTRICLE             IVC RV S prime:     11.70 cm/s  IVC diam: 2.10 cm TAPSE (M-mode): 1.2 cm LEFT ATRIUM             Index        RIGHT ATRIUM           Index LA diam:        3.20 cm 1.52 cm/m   RA Area:     18.20 cm LA Vol (A2C):   28.0 ml 13.30 ml/m  RA Volume:   49.00 ml  23.28 ml/m LA Vol (A4C):   46.0 ml 21.85 ml/m LA Biplane Vol: 37.1 ml 17.62 ml/m  AORTIC VALVE AV Area (Vmax):    2.26 cm AV Area (Vmean):   2.25 cm AV Area (VTI):     2.63 cm AV Vmax:           171.50 cm/s AV Vmean:          112.700 cm/s AV VTI:  0.241 m AV Peak Grad:      11.8 mmHg AV Mean Grad:      6.0 mmHg LVOT Vmax:         85.57 cm/s LVOT Vmean:        55.967 cm/s LVOT VTI:          0.140 m LVOT/AV VTI ratio: 0.58 AI PHT:            1632 msec  AORTA Ao Root diam: 4.00 cm Ao Asc diam:  4.50 cm MITRAL VALVE               TRICUSPID VALVE MV Area (PHT): 4.19 cm    TR Peak grad:   21.9 mmHg MV Decel Time: 181 msec    TR Vmax:        234.00 cm/s MV E velocity: 91.25 cm/s                            SHUNTS                            Systemic VTI:  0.14 m                            Systemic Diam: 2.40 cm Sheryle Donning MD Electronically signed by Sheryle Donning MD Signature Date/Time: 02/19/2024/7:11:05 PM    Final     Cardiac Studies   Echocardiogram 02/19/2024: Impressions: 1. Left ventricular ejection fraction, by estimation, is 45 to 50%. The  left ventricle has mildly decreased function. The left ventricle  demonstrates global  hypokinesis. There is moderate left ventricular  hypertrophy. Left ventricular diastolic  parameters are indeterminate.   2. Right ventricular systolic function is moderately reduced. The right  ventricular size is moderately enlarged. Mildly increased right  ventricular wall thickness. There is mildly elevated pulmonary artery  systolic pressure.   3. The mitral valve is grossly normal. Mild mitral valve regurgitation.  No evidence of mitral stenosis.   4. Tricuspid valve regurgitation is mild to moderate.   5. The aortic valve is calcified. There is severe calcifcation of the  aortic valve. Aortic valve regurgitation is mild. Aortic valve sclerosis  is present, with no evidence of aortic valve stenosis.   6. Aortic dilatation noted. There is moderate dilatation of the ascending  aorta, measuring 45 mm. There is mild dilatation of the aortic root,  measuring 40 mm.   7. The inferior vena cava is dilated in size with <50% respiratory  variability, suggesting right atrial pressure of 15 mmHg.   Comparison(s): Changes from prior study are noted. Now in afib, EF mildly  reduced.    Patient Profile     83 y.o. male  with a history of persistent atrial fibrillation on Eliquis , dilated ascending aorta, hypertension, BPH, gout, and shingles in 2017 who was admitted on 02/12/2024 for acute cholecystis after presenting with abdominal pain. Cardiology was consulted for pre-op evaluation and for assistance with management of atrial fibrillation. He underwent a laparoscopic cholecystectomy on 02/15/2024.   Assessment & Plan     Persistent Atrial Fibrillation Initially diagnosed at office visit on 01/25/2024. Rate control strategy was recommended given he was completely asymptomatic. He was admitted wit acute cholecystitis and was started on IV Diltiazem  on 5/5 due to RVR. Echo showed LVEF of 45-50% with global hypokinesis. - Rates remain elevated. Ranging from  110s to 150s but mostly in the 130s to  150s while I was in the room. - Potassium 4.1 and Magnesium  2.1 today. - TSH normal last month. - Patient on Lopressor  25mg  twice daily at home. However, this was increased to 100mg  twice daily here in the hospital. Continue.  - Continue IV Diltiazem . Currently at 10 mg/hr. Will ask RN to increase to 15mg / hr. - CHA2DS2-VASc = 4 (vascular disease, HTN, age x2). Continue Eliquis  5mg  twice daily.  - Given we have been unable to rate control, would recommend TEE/ DCCV. Will try to arrange this for tomorrow.  Shared Decision Making/Informed Consent{ The risks [stroke, cardiac arrhythmias rarely resulting in the need for a temporary or permanent pacemaker, skin irritation or burns, esophageal damage, perforation (1:10,000 risk), bleeding, pharyngeal hematoma as well as other potential complications associated with conscious sedation including aspiration, arrhythmia, respiratory failure and death], benefits (treatment guidance, restoration of normal sinus rhythm, diagnostic support) and alternatives of a transesophageal echocardiogram guided cardioversion were discussed in detail with Mr. Schoff and he is willing to proceed.    New Diagnosed HFmrEF Echo this admission showed mildly reduced EF of 45-50% as well as moderately enlarged RV with moderately reduced RV function. LV and RV dysfunction new compared to last Echo in 10/2022. - No signs or symptoms of CHF.  - Continue Lopressor  100mg  twice daily. Can transition to Toprol -XL following DCCV. - On Lisinopril  40mg  daily at home but this is currently on hold. Will continue to hold to allow for more rate control but should be able to restart prior to discharge. - Suspect cardiomyopathy is due to atrial fibrillation with RVR. Can repeat Echo in about 2 months after restoration of sinus rhythm to reassess LV Function.  Mild Aortic Insufficiency Mild Mitral Regurgitation Moderate Tricuspid Regurgitation Noted on Echo this admission. - Can continue to  monitor as an outpatient.    Hypertension BP well controlled this morning.  - Continue Lopressor  100mg  twice daily. - Continue IV Diltiazem . - Patient on Amlodipine 10mg  daily and Lisinopril  40mg  daily at home but these were held on admission. Continue to hold to allow for additional rate control.   Ascending Aortic Aneurysm CTA this admission showed a 4.7cm ascending aortic aneurysm.  - Continue beta-blocker as above. - Will need semi-annual imaging with CTA/ MRA. - Recommend outpatient CT surgery referral.   Otherwise, per primary team: - Acute cholecystitis s/p laparoscopic cholecystectomy on 5/1 - BPH - Gout   For questions or updates, please contact [facility] Please consult [email or web address] for contact info under        Signed, Callie E Goodrich, PA-C  02/20/2024, 7:42 AM    Attending Note:   The patient was seen and examined.  Agree with assessment and plan as noted above.  Changes made to the above note as needed.  Patient seen and independently examined with Jolynn Needy, PA .   We discussed all aspects of the encounter. I agree with the assessment and plan as stated above.   Atrial fibrillation with rapid ventricular response: He remains in atrial fibrillation.  His rate is tachycardic.  We will continue to gradually titrate up his medications We will set him up for a TEE cardioversion tomorrow if there is a slot available.  Continue Eliquis  5 mg twice a day.  2.  HFrEF: Echocardiogram shows mildly depressed LVEF of 45 to 50%.  He has moderately enlarged RV size with moderately reduced RV function. Hopefully this will improve with cardioversion.  3.  Hypertension: Continue current medications.  4.  Ascending aortic aneurysm: He was found to have a 4.7 cm ascending aortic aneurysm.  Will perform a follow-up CTA or MRA in approximately 6 months. He will need outpatient referral to CT surgery.     I have spent a total of 40 minutes with patient reviewing  hospital  notes , telemetry, EKGs, labs and examining patient as well as establishing an assessment and plan that was discussed with the patient.  > 50% of time was spent in direct patient care.    [place], Marieta Shorten., MD, [facility] 02/20/2024, 9:13 AM [address],  [address] Office [phone] Pager [phone]

## 2024-02-20 NOTE — Progress Notes (Addendum)
 Rounding Note    Patient Name: Jose Hood Date of Encounter: 02/20/2024  [facility] Cardiologist: Oneil Bigness, MD   Subjective   He remains in atrial fibrillation with rates in the 130s to 150s. However, he remains asymptomatic with this. He denies any chest pain, shortness of breath, palpitations, lightheadedness, or dizziness but he has not been ambulating much.  Inpatient Medications    Scheduled Meds:  apixaban   5 mg Oral BID   docusate sodium   100 mg Oral BID   finasteride   5 mg Oral Daily   metoprolol  tartrate  100 mg Oral BID   polyethylene glycol  17 g Oral Daily   probenecid   500 mg Oral Daily   tamsulosin   0.4 mg Oral Daily   Continuous Infusions:  diltiazem  (CARDIZEM ) infusion 7.5 mg/hr (02/19/24 2202)   PRN Meds: acetaminophen , metoprolol  tartrate, morphine  injection, ondansetron  **OR** ondansetron  (ZOFRAN ) IV, mouth rinse, oxyCODONE    Vital Signs    Vitals:   02/19/24 1717 02/19/24 2033 02/19/24 2312 02/20/24 0452  BP: (!) 126/94 117/71 108/63 126/80  Pulse: 86 98 79 100  Resp: 18 18 19 18   Temp: 98 F (36.7 C) 98 F (36.7 C) (!) 97.5 F (36.4 C) 98 F (36.7 C)  TempSrc: Oral Oral Oral Oral  SpO2:  97% 99% 96%  Weight:      Height:        Intake/Output Summary (Last 24 hours) at 02/20/2024 0742 Last data filed at 02/20/2024 0452 Gross per 24 hour  Intake 416.6 ml  Output 830 ml  Net -413.4 ml      02/15/2024    7:20 AM 02/13/2024   12:46 PM 02/12/2024    9:22 PM  Last 3 Weights  Weight (lbs) 190 lb 188 lb 14.4 oz 190 lb  Weight (kg) 86.183 kg 85.684 kg 86.183 kg      Telemetry    Atrial fibrillation with rates in th 110s to 150s at rest. - Personally Reviewed  ECG    No new ECG tracing today. - Personally Reviewed  Physical Exam   GEN: 83 year old Caucasian male resting comfortably in no acute distress.   Neck: No JVD. Cardiac: Tachycardic with irregularly irregular rhythm. No significant murmurs, rubs, or gallops.   Respiratory: Clear to auscultation bilaterally. No wheezes, rhonchi, or rales. MS: No lower extremity edema. No deformity. Skin: Warm and dry. Neuro:  No focal deficits. Psych: Normal affect. Responds appropriately.   Labs    High Sensitivity Troponin:   Recent Labs  Lab 02/12/24 2128  TROPONINIHS 10     Chemistry Recent Labs  Lab 02/16/24 0727 02/17/24 0410 02/18/24 0241 02/20/24 0450  NA 136 138 140 137  K 4.2 4.3 4.0 4.1  CL 106 103 106 105  CO2 22 24 25 22   GLUCOSE 160* 127* 105* 107*  BUN 18 22 22  32*  CREATININE 1.13 1.14 1.03 1.09  CALCIUM 8.4* 8.7* 9.0 8.6*  MG 2.1 2.3 2.1 2.1  PROT 6.0* 5.9* 5.5*  --   ALBUMIN  2.6* 2.8* 2.7*  --   AST 28 27 28   --   ALT 19 19 24   --   ALKPHOS 46 48 41  --   BILITOT 1.2 1.0 0.9  --   GFRNONAA >60 >60 >60 >60  ANIONGAP 8 11 9 10     Lipids No results for input(s): "CHOL", "TRIG", "HDL", "LABVLDL", "LDLCALC", "CHOLHDL" in the last 168 hours.  Hematology Recent Labs  Lab 02/18/24 [phone]  02/19/24 0552 02/20/24 0450  WBC 6.5 7.1 7.5  RBC 5.33 5.82* 5.73  HGB 15.8 17.2* 17.2*  HCT 47.4 51.2 50.0  MCV 88.9 88.0 87.3  MCH 29.6 29.6 30.0  MCHC 33.3 33.6 34.4  RDW 13.6 13.7 13.8  PLT 261 275 272   Thyroid  No results for input(s): "TSH", "FREET4" in the last 168 hours.  BNPNo results for input(s): "BNP", "PROBNP" in the last 168 hours.  DDimer No results for input(s): "DDIMER" in the last 168 hours.   Radiology    ECHOCARDIOGRAM COMPLETE Result Date: 02/19/2024    ECHOCARDIOGRAM REPORT   Patient Name:   Jose Hood Date of Exam: 02/19/2024 Medical Rec #:  [id number]  Height:       73.0 in Accession #:    [id number] Weight:       190.0 lb Date of Birth:  13-Nov-1940   BSA:          2.105 m Patient Age:    83 years   BP:           121/84 mmHg Patient Gender: M          HR:           93 bpm. Exam Location:  Inpatient Procedure: 2D Echo, Cardiac Doppler and Color Doppler (Both Spectral and Color            Flow Doppler were utilized  during procedure). Indications:    I48.91* Unspeicified atrial fibrillation  History:        Patient has prior history of Echocardiogram examinations, most                 recent 11/11/2022. Abnormal ECG, Aortic Valve Disease,                 Arrythmias:Atrial Fibrillation; Risk Factors:Hypertension and                 Dyslipidemia.  Sonographer:    Raynelle Callow RDCS Referring Phys: [id number] Casimer Clear  Sonographer Comments: Technically difficult study due to poor echo windows. Image acquisition challenging due to respiratory motion. IMPRESSIONS  1. Left ventricular ejection fraction, by estimation, is 45 to 50%. The left ventricle has mildly decreased function. The left ventricle demonstrates global hypokinesis. There is moderate left ventricular hypertrophy. Left ventricular diastolic parameters are indeterminate.  2. Right ventricular systolic function is moderately reduced. The right ventricular size is moderately enlarged. Mildly increased right ventricular wall thickness. There is mildly elevated pulmonary artery systolic pressure.  3. The mitral valve is grossly normal. Mild mitral valve regurgitation. No evidence of mitral stenosis.  4. Tricuspid valve regurgitation is mild to moderate.  5. The aortic valve is calcified. There is severe calcifcation of the aortic valve. Aortic valve regurgitation is mild. Aortic valve sclerosis is present, with no evidence of aortic valve stenosis.  6. Aortic dilatation noted. There is moderate dilatation of the ascending aorta, measuring 45 mm. There is mild dilatation of the aortic root, measuring 40 mm.  7. The inferior vena cava is dilated in size with <50% respiratory variability, suggesting right atrial pressure of 15 mmHg. Comparison(s): Changes from prior study are noted. Now in afib, EF mildly reduced. FINDINGS  Left Ventricle: Left ventricular ejection fraction, by estimation, is 45 to 50%. The left ventricle has mildly decreased function. The left ventricle  demonstrates global hypokinesis. The left ventricular internal cavity size was normal in size. There is  moderate left ventricular hypertrophy. Left ventricular diastolic function could  not be evaluated due to atrial fibrillation. Left ventricular diastolic parameters are indeterminate. Right Ventricle: The right ventricular size is moderately enlarged. Mildly increased right ventricular wall thickness. Right ventricular systolic function is moderately reduced. There is mildly elevated pulmonary artery systolic pressure. The tricuspid regurgitant velocity is 2.34 m/s, and with an assumed right atrial pressure of 15 mmHg, the estimated right ventricular systolic pressure is 36.9 mmHg. Left Atrium: Left atrial size was normal in size. Right Atrium: Right atrial size was normal in size. Pericardium: There is no evidence of pericardial effusion. Mitral Valve: The mitral valve is grossly normal. Mild mitral valve regurgitation. No evidence of mitral valve stenosis. Tricuspid Valve: The tricuspid valve is normal in structure. Tricuspid valve regurgitation is mild to moderate. No evidence of tricuspid stenosis. Aortic Valve: The aortic valve is calcified. There is severe calcifcation of the aortic valve. Aortic valve regurgitation is mild. Aortic regurgitation PHT measures 1632 msec. Aortic valve sclerosis is present, with no evidence of aortic valve stenosis. Aortic valve mean gradient measures 6.0 mmHg. Aortic valve peak gradient measures 11.8 mmHg. Aortic valve area, by VTI measures 2.63 cm. Pulmonic Valve: The pulmonic valve was normal in structure. Pulmonic valve regurgitation is trivial. No evidence of pulmonic stenosis. Aorta: Aortic dilatation noted. There is moderate dilatation of the ascending aorta, measuring 45 mm. There is mild dilatation of the aortic root, measuring 40 mm. Venous: The inferior vena cava is dilated in size with less than 50% respiratory variability, suggesting right atrial pressure of 15  mmHg. IAS/Shunts: No atrial level shunt detected by color flow Doppler.  LEFT VENTRICLE PLAX 2D LVIDd:         3.60 cm LVIDs:         2.70 cm LV PW:         1.40 cm LV IVS:        1.40 cm LVOT diam:     2.40 cm LV SV:         63 LV SV Index:   30 LVOT Area:     4.52 cm  LV Volumes (MOD) LV vol d, MOD A2C: 49.3 ml LV vol d, MOD A4C: 66.1 ml LV vol s, MOD A2C: 27.2 ml LV vol s, MOD A4C: 35.6 ml LV SV MOD A2C:     22.1 ml LV SV MOD A4C:     66.1 ml LV SV MOD BP:      24.2 ml RIGHT VENTRICLE             IVC RV S prime:     11.70 cm/s  IVC diam: 2.10 cm TAPSE (M-mode): 1.2 cm LEFT ATRIUM             Index        RIGHT ATRIUM           Index LA diam:        3.20 cm 1.52 cm/m   RA Area:     18.20 cm LA Vol (A2C):   28.0 ml 13.30 ml/m  RA Volume:   49.00 ml  23.28 ml/m LA Vol (A4C):   46.0 ml 21.85 ml/m LA Biplane Vol: 37.1 ml 17.62 ml/m  AORTIC VALVE AV Area (Vmax):    2.26 cm AV Area (Vmean):   2.25 cm AV Area (VTI):     2.63 cm AV Vmax:           171.50 cm/s AV Vmean:          112.700 cm/s AV VTI:  0.241 m AV Peak Grad:      11.8 mmHg AV Mean Grad:      6.0 mmHg LVOT Vmax:         85.57 cm/s LVOT Vmean:        55.967 cm/s LVOT VTI:          0.140 m LVOT/AV VTI ratio: 0.58 AI PHT:            1632 msec  AORTA Ao Root diam: 4.00 cm Ao Asc diam:  4.50 cm MITRAL VALVE               TRICUSPID VALVE MV Area (PHT): 4.19 cm    TR Peak grad:   21.9 mmHg MV Decel Time: 181 msec    TR Vmax:        234.00 cm/s MV E velocity: 91.25 cm/s                            SHUNTS                            Systemic VTI:  0.14 m                            Systemic Diam: 2.40 cm Sheryle Donning MD Electronically signed by Sheryle Donning MD Signature Date/Time: 02/19/2024/7:11:05 PM    Final     Cardiac Studies   Echocardiogram 02/19/2024: Impressions: 1. Left ventricular ejection fraction, by estimation, is 45 to 50%. The  left ventricle has mildly decreased function. The left ventricle  demonstrates global  hypokinesis. There is moderate left ventricular  hypertrophy. Left ventricular diastolic  parameters are indeterminate.   2. Right ventricular systolic function is moderately reduced. The right  ventricular size is moderately enlarged. Mildly increased right  ventricular wall thickness. There is mildly elevated pulmonary artery  systolic pressure.   3. The mitral valve is grossly normal. Mild mitral valve regurgitation.  No evidence of mitral stenosis.   4. Tricuspid valve regurgitation is mild to moderate.   5. The aortic valve is calcified. There is severe calcifcation of the  aortic valve. Aortic valve regurgitation is mild. Aortic valve sclerosis  is present, with no evidence of aortic valve stenosis.   6. Aortic dilatation noted. There is moderate dilatation of the ascending  aorta, measuring 45 mm. There is mild dilatation of the aortic root,  measuring 40 mm.   7. The inferior vena cava is dilated in size with <50% respiratory  variability, suggesting right atrial pressure of 15 mmHg.   Comparison(s): Changes from prior study are noted. Now in afib, EF mildly  reduced.    Patient Profile     83 y.o. male  with a history of persistent atrial fibrillation on Eliquis , dilated ascending aorta, hypertension, BPH, gout, and shingles in 2017 who was admitted on 02/12/2024 for acute cholecystis after presenting with abdominal pain. Cardiology was consulted for pre-op evaluation and for assistance with management of atrial fibrillation. He underwent a laparoscopic cholecystectomy on 02/15/2024.   Assessment & Plan     Persistent Atrial Fibrillation Initially diagnosed at office visit on 01/25/2024. Rate control strategy was recommended given he was completely asymptomatic. He was admitted wit acute cholecystitis and was started on IV Diltiazem  on 5/5 due to RVR. Echo showed LVEF of 45-50% with global hypokinesis. - Rates remain elevated. Ranging from  110s to 150s but mostly in the 130s to  150s while I was in the room. - Potassium 4.1 and Magnesium  2.1 today. - TSH normal last month. - Patient on Lopressor  25mg  twice daily at home. However, this was increased to 100mg  twice daily here in the hospital. Continue.  - Continue IV Diltiazem . Currently at 10 mg/hr. Will ask RN to increase to 15mg / hr. - CHA2DS2-VASc = 4 (vascular disease, HTN, age x2). Continue Eliquis  5mg  twice daily.  - Given we have been unable to rate control, would recommend TEE/ DCCV. Will try to arrange this for tomorrow.  Shared Decision Making/Informed Consent{ The risks [stroke, cardiac arrhythmias rarely resulting in the need for a temporary or permanent pacemaker, skin irritation or burns, esophageal damage, perforation (1:10,000 risk), bleeding, pharyngeal hematoma as well as other potential complications associated with conscious sedation including aspiration, arrhythmia, respiratory failure and death], benefits (treatment guidance, restoration of normal sinus rhythm, diagnostic support) and alternatives of a transesophageal echocardiogram guided cardioversion were discussed in detail with Mr. Acocella and he is willing to proceed.    New Diagnosed HFmrEF Echo this admission showed mildly reduced EF of 45-50% as well as moderately enlarged RV with moderately reduced RV function. LV and RV dysfunction new compared to last Echo in 10/2022. - No signs or symptoms of CHF.  - Continue Lopressor  100mg  twice daily. Can transition to Toprol -XL following DCCV. - On Lisinopril  40mg  daily at home but this is currently on hold. Will continue to hold to allow for more rate control but should be able to restart prior to discharge. - Suspect cardiomyopathy is due to atrial fibrillation with RVR. Can repeat Echo in about 2 months after restoration of sinus rhythm to reassess LV Function.  Mild Aortic Insufficiency Mild Mitral Regurgitation Moderate Tricuspid Regurgitation Noted on Echo this admission. - Can continue to  monitor as an outpatient.    Hypertension BP well controlled this morning.  - Continue Lopressor  100mg  twice daily. - Continue IV Diltiazem . - Patient on Amlodipine 10mg  daily and Lisinopril  40mg  daily at home but these were held on admission. Continue to hold to allow for additional rate control.   Ascending Aortic Aneurysm CTA this admission showed a 4.7cm ascending aortic aneurysm.  - Continue beta-blocker as above. - Will need semi-annual imaging with CTA/ MRA. - Recommend outpatient CT surgery referral.   Otherwise, per primary team: - Acute cholecystitis s/p laparoscopic cholecystectomy on 5/1 - BPH - Gout   For questions or updates, please contact [facility] Please consult [email or web address] for contact info under        Signed, Callie E Goodrich, PA-C  02/20/2024, 7:42 AM    Attending Note:   The patient was seen and examined.  Agree with assessment and plan as noted above.  Changes made to the above note as needed.  Patient seen and independently examined with Jolynn Needy, PA .   We discussed all aspects of the encounter. I agree with the assessment and plan as stated above.   Atrial fibrillation with rapid ventricular response: He remains in atrial fibrillation.  His rate is tachycardic.  We will continue to gradually titrate up his medications We will set him up for a TEE cardioversion tomorrow if there is a slot available.  Continue Eliquis  5 mg twice a day.  2.  HFrEF: Echocardiogram shows mildly depressed LVEF of 45 to 50%.  He has moderately enlarged RV size with moderately reduced RV function. Hopefully this will improve with cardioversion.  3.  Hypertension: Continue current medications.  4.  Ascending aortic aneurysm: He was found to have a 4.7 cm ascending aortic aneurysm.  Will perform a follow-up CTA or MRA in approximately 6 months. He will need outpatient referral to CT surgery.     I have spent a total of 40 minutes with patient reviewing  hospital  notes , telemetry, EKGs, labs and examining patient as well as establishing an assessment and plan that was discussed with the patient.  > 50% of time was spent in direct patient care.    [place], Marieta Shorten., MD, [facility] 02/20/2024, 9:13 AM [address],  [address] Office [phone] Pager [phone]

## 2024-02-20 NOTE — Anesthesia Preprocedure Evaluation (Signed)
 Anesthesia Evaluation  Patient identified by MRN, date of birth, ID band Patient awake    Reviewed: Allergy & Precautions, NPO status , Patient's Chart, lab work & pertinent test results, reviewed documented beta blocker date and time   Airway Mallampati: I  TM Distance: >3 FB Neck ROM: Full    Dental  (+) Dental Advisory Given, Chipped,    Pulmonary neg pulmonary ROS   Pulmonary exam normal breath sounds clear to auscultation       Cardiovascular hypertension, Pt. on home beta blockers and Pt. on medications Normal cardiovascular exam+ dysrhythmias Atrial Fibrillation  Rhythm:Irregular Rate:Normal  TTE 02/2024  1. Left ventricular ejection fraction, by estimation, is 45 to 50%. The  left ventricle has mildly decreased function. The left ventricle  demonstrates global hypokinesis. There is moderate left ventricular  hypertrophy. Left ventricular diastolic  parameters are indeterminate.   2. Right ventricular systolic function is moderately reduced. The right  ventricular size is moderately enlarged. Mildly increased right  ventricular wall thickness. There is mildly elevated pulmonary artery  systolic pressure.   3. The mitral valve is grossly normal. Mild mitral valve regurgitation.  No evidence of mitral stenosis.   4. Tricuspid valve regurgitation is mild to moderate.   5. The aortic valve is calcified. There is severe calcifcation of the  aortic valve. Aortic valve regurgitation is mild. Aortic valve sclerosis  is present, with no evidence of aortic valve stenosis.   6. Aortic dilatation noted. There is moderate dilatation of the ascending  aorta, measuring 45 mm. There is mild dilatation of the aortic root,  measuring 40 mm.   7. The inferior vena cava is dilated in size with <50% respiratory  variability, suggesting right atrial pressure of 15 mmHg.     Neuro/Psych negative neurological ROS  negative psych ROS    GI/Hepatic negative GI ROS, Neg liver ROS,,,  Endo/Other  negative endocrine ROS    Renal/GU negative Renal ROS  negative genitourinary   Musculoskeletal  (+) Arthritis ,    Abdominal   Peds  Hematology negative hematology ROS (+)   Anesthesia Other Findings 83 year old with past medical history significant for paroxysmal A-fib on Eliquis , hypertension, gout presented to the hospital complaining of abdominal pain, nausea and vomiting.  Found to have acute cholecystitis.  Patient underwent laparoscopic cholecystectomy 5/1.  Post op course c/b  A-fib with RVR  Reproductive/Obstetrics                             Anesthesia Physical Anesthesia Plan  ASA: 3  Anesthesia Plan: MAC   Post-op Pain Management:    Induction: Intravenous  PONV Risk Score and Plan: Propofol  infusion and Treatment may vary due to age or medical condition  Airway Management Planned: Natural Airway  Additional Equipment:   Intra-op Plan:   Post-operative Plan:   Informed Consent: I have reviewed the patients History and Physical, chart, labs and discussed the procedure including the risks, benefits and alternatives for the proposed anesthesia with the patient or authorized representative who has indicated his/her understanding and acceptance.     Dental advisory given  Plan Discussed with: CRNA  Anesthesia Plan Comments:        Anesthesia Quick Evaluation

## 2024-02-20 NOTE — Progress Notes (Signed)
 PROGRESS NOTE    Jose Hood  ZOX:[id number] DOB: 19-Feb-1941 DOA: 02/12/2024 PCP: Jose Coho, MD   Brief Narrative: 83 year old with past medical history significant for paroxysmal A-fib on Eliquis , hypertension, gout presented to the hospital complaining of abdominal pain, nausea and vomiting.  Found to have acute cholecystitis.  General surgery was consulted.  Cardiology was consulted as well for preop clearance.  Patient underwent laparoscopic cholecystectomy 5/1.  He has been having issues with A-fib with RVR.  HR still elevated, cardiology adjusting meds.  Assessment & Plan:   Principal Problem:   Acute cholecystitis Active Problems:   Dyslipidemia   Essential hypertension   Paroxysmal atrial fibrillation (HCC)  Paroxysmal A-fib with RVR Cardiology following.  Input appreciated. Heart rate uncontrolled, in the 150s, started on Cardizem  drip, subsequently discontinued.  Metoprolol  dose increased. Cardizem  drip resumed on 02/19/2024 due to uncontrolled heart rate. Patient remains tachycardic.  - Continue Toprol , IV diltiazem . -Cardiology planning on TEE/DCCV in AM (5/7).   Acute cholecystitis Presented with abdominal pain.  Ultrasound shows gallbladder wall thickening with sludge. General surgery consulted. HIDA scan actually positive. Underwent lap chole on 5/1. Antibiotic discontinued.  IV heparin  discontinued and switched to oral Eliquis . Tolerating diet, had BM. Patient has been cleared for discharge by surgery.  Hypertension On metoprolol . Also on Cardizem  gtt.   BPH Continue with Flomax  and Proscar    Gout Continue with probenecid    Ascending aortic aneurysm measuring 4.7 cm diameter.   Cardiology recommend semi-annual imaging followup by CTA or MRA and referral to cardiothoracic surgery if not already obtained.      Estimated body mass index is 25.07 kg/m as calculated from the following:   Height as of this encounter: 6\' 1"  (1.854 m).   Weight as of  this encounter: 86.2 kg.   DVT prophylaxis: Eliquis  Code Status: Full code Family Communication: Wife at bedside.  Disposition Plan:  Status is: Inpatient Remains inpatient appropriate because: A-fib with RVR remains uncontrolled and patient is on a diltiazem  gtt, pending TEE/DCCV.    Consultants:  Surgery  Cardiology  Procedures:  Laparoscopic subtotal stapled cholecystectomy on 5/1   Subjective: Patient continues to feel well. He remains asymptomatic. He denies dizziness, lightheadedness. Heart rate remains uncontrolled. For TTE/DCCV in AM.  Objective: Vitals:   02/20/24 0452 02/20/24 0801 02/20/24 0900 02/20/24 1021  BP: 126/80 120/85 (!) 146/87 118/62  Pulse: 100 74 (!) 56 (!) 104  Resp: 18 18  18   Temp: 98 F (36.7 C) 97.6 F (36.4 C)  97.7 F (36.5 C)  TempSrc: Oral Axillary  Oral  SpO2: 96% 99% 99% 98%  Weight:      Height:        Intake/Output Summary (Last 24 hours) at 02/20/2024 1404 Last data filed at 02/20/2024 1000 Gross per 24 hour  Intake 870.07 ml  Output 880 ml  Net -9.93 ml   Filed Weights   02/12/24 2122 02/13/24 1246 02/15/24 0720  Weight: 86.2 kg 85.7 kg 86.2 kg     General: Alert, oriented X3  Eyes: Pupils equal, reactive  Oral cavity: moist mucous membranes  Head: Atraumatic, normocephalic  Neck: supple  Chest: clear to auscultation. No crackles, no wheezes  CVS: S1,S2 RRR. No murmurs  Abd: No distention, soft, non-tender. No masses palpable  Extr: No edema   MSK: No joint deformities or swelling  Neurological: Grossly intact.    Data Reviewed: I have personally reviewed following labs and imaging studies  CBC: Recent Labs  Lab 02/16/24 0727  02/17/24 0410 02/18/24 0241 02/19/24 0552 02/20/24 0450  WBC 8.2 8.9 6.5 7.1 7.5  NEUTROABS  --  6.9 4.1 4.2  --   HGB 15.0 15.5 15.8 17.2* 17.2*  HCT 44.0 46.3 47.4 51.2 50.0  MCV 88.4 89.6 88.9 88.0 87.3  PLT 237 275 261 275 272   Basic Metabolic Panel: Recent Labs  Lab  02/15/24 0443 02/16/24 0727 02/17/24 0410 02/18/24 0241 02/20/24 0450  NA 137 136 138 140 137  K 3.5 4.2 4.3 4.0 4.1  CL 104 106 103 106 105  CO2 21* 22 24 25 22   GLUCOSE 106* 160* 127* 105* 107*  BUN 16 18 22 22  32*  CREATININE 1.07 1.13 1.14 1.03 1.09  CALCIUM 8.4* 8.4* 8.7* 9.0 8.6*  MG 2.0 2.1 2.3 2.1 2.1  PHOS 2.6  --   --   --   --    GFR: Estimated Creatinine Clearance: 58 mL/min (by C-G formula based on SCr of 1.09 mg/dL). Liver Function Tests: Recent Labs  Lab 02/14/24 0455 02/15/24 0443 02/16/24 0727 02/17/24 0410 02/18/24 0241  AST 16 16 28 27 28   ALT 11 11 19 19 24   ALKPHOS 49 47 46 48 41  BILITOT 2.6* 2.1* 1.2 1.0 0.9  PROT 5.6* 5.7* 6.0* 5.9* 5.5*  ALBUMIN  2.6* 2.5* 2.6* 2.8* 2.7*   No results for input(s): "LIPASE", "AMYLASE" in the last 168 hours. No results for input(s): "AMMONIA" in the last 168 hours. Coagulation Profile: No results for input(s): "INR", "PROTIME" in the last 168 hours. Cardiac Enzymes: No results for input(s): "CKTOTAL", "CKMB", "CKMBINDEX", "TROPONINI" in the last 168 hours. BNP (last 3 results) No results for input(s): "PROBNP" in the last 8760 hours. HbA1C: No results for input(s): "HGBA1C" in the last 72 hours. CBG: No results for input(s): "GLUCAP" in the last 168 hours. Lipid Profile: No results for input(s): "CHOL", "HDL", "LDLCALC", "TRIG", "CHOLHDL", "LDLDIRECT" in the last 72 hours. Thyroid  Function Tests: No results for input(s): "TSH", "T4TOTAL", "FREET4", "T3FREE", "THYROIDAB" in the last 72 hours. Anemia Panel: No results for input(s): "VITAMINB12", "FOLATE", "FERRITIN", "TIBC", "IRON", "RETICCTPCT" in the last 72 hours. Sepsis Labs: No results for input(s): "PROCALCITON", "LATICACIDVEN" in the last 168 hours.  No results found for this or any previous visit (from the past 240 hours).       Radiology Studies: ECHOCARDIOGRAM COMPLETE Result Date: 02/19/2024    ECHOCARDIOGRAM REPORT   Patient Name:   Jose Hood Date of Exam: 02/19/2024 Medical Rec #:  [id number]  Height:       73.0 in Accession #:    [id number] Weight:       190.0 lb Date of Birth:  Nov 13, 1940   BSA:          2.105 m Patient Age:    82 years   BP:           121/84 mmHg Patient Gender: M          HR:           93 bpm. Exam Location:  Inpatient Procedure: 2D Echo, Cardiac Doppler and Color Doppler (Both Spectral and Color            Flow Doppler were utilized during procedure). Indications:    I48.91* Unspeicified atrial fibrillation  History:        Patient has prior history of Echocardiogram examinations, most                 recent 11/11/2022. Abnormal ECG, Aortic  Valve Disease,                 Arrythmias:Atrial Fibrillation; Risk Factors:Hypertension and                 Dyslipidemia.  Sonographer:    Raynelle Callow RDCS Referring Phys: [id number] Casimer Clear  Sonographer Comments: Technically difficult study due to poor echo windows. Image acquisition challenging due to respiratory motion. IMPRESSIONS  1. Left ventricular ejection fraction, by estimation, is 45 to 50%. The left ventricle has mildly decreased function. The left ventricle demonstrates global hypokinesis. There is moderate left ventricular hypertrophy. Left ventricular diastolic parameters are indeterminate.  2. Right ventricular systolic function is moderately reduced. The right ventricular size is moderately enlarged. Mildly increased right ventricular wall thickness. There is mildly elevated pulmonary artery systolic pressure.  3. The mitral valve is grossly normal. Mild mitral valve regurgitation. No evidence of mitral stenosis.  4. Tricuspid valve regurgitation is mild to moderate.  5. The aortic valve is calcified. There is severe calcifcation of the aortic valve. Aortic valve regurgitation is mild. Aortic valve sclerosis is present, with no evidence of aortic valve stenosis.  6. Aortic dilatation noted. There is moderate dilatation of the ascending aorta, measuring 45 mm. There is mild  dilatation of the aortic root, measuring 40 mm.  7. The inferior vena cava is dilated in size with <50% respiratory variability, suggesting right atrial pressure of 15 mmHg. Comparison(s): Changes from prior study are noted. Now in afib, EF mildly reduced. FINDINGS  Left Ventricle: Left ventricular ejection fraction, by estimation, is 45 to 50%. The left ventricle has mildly decreased function. The left ventricle demonstrates global hypokinesis. The left ventricular internal cavity size was normal in size. There is  moderate left ventricular hypertrophy. Left ventricular diastolic function could not be evaluated due to atrial fibrillation. Left ventricular diastolic parameters are indeterminate. Right Ventricle: The right ventricular size is moderately enlarged. Mildly increased right ventricular wall thickness. Right ventricular systolic function is moderately reduced. There is mildly elevated pulmonary artery systolic pressure. The tricuspid regurgitant velocity is 2.34 m/s, and with an assumed right atrial pressure of 15 mmHg, the estimated right ventricular systolic pressure is 36.9 mmHg. Left Atrium: Left atrial size was normal in size. Right Atrium: Right atrial size was normal in size. Pericardium: There is no evidence of pericardial effusion. Mitral Valve: The mitral valve is grossly normal. Mild mitral valve regurgitation. No evidence of mitral valve stenosis. Tricuspid Valve: The tricuspid valve is normal in structure. Tricuspid valve regurgitation is mild to moderate. No evidence of tricuspid stenosis. Aortic Valve: The aortic valve is calcified. There is severe calcifcation of the aortic valve. Aortic valve regurgitation is mild. Aortic regurgitation PHT measures 1632 msec. Aortic valve sclerosis is present, with no evidence of aortic valve stenosis. Aortic valve mean gradient measures 6.0 mmHg. Aortic valve peak gradient measures 11.8 mmHg. Aortic valve area, by VTI measures 2.63 cm. Pulmonic Valve:  The pulmonic valve was normal in structure. Pulmonic valve regurgitation is trivial. No evidence of pulmonic stenosis. Aorta: Aortic dilatation noted. There is moderate dilatation of the ascending aorta, measuring 45 mm. There is mild dilatation of the aortic root, measuring 40 mm. Venous: The inferior vena cava is dilated in size with less than 50% respiratory variability, suggesting right atrial pressure of 15 mmHg. IAS/Shunts: No atrial level shunt detected by color flow Doppler.  LEFT VENTRICLE PLAX 2D LVIDd:         3.60 cm LVIDs:  2.70 cm LV PW:         1.40 cm LV IVS:        1.40 cm LVOT diam:     2.40 cm LV SV:         63 LV SV Index:   30 LVOT Area:     4.52 cm  LV Volumes (MOD) LV vol d, MOD A2C: 49.3 ml LV vol d, MOD A4C: 66.1 ml LV vol s, MOD A2C: 27.2 ml LV vol s, MOD A4C: 35.6 ml LV SV MOD A2C:     22.1 ml LV SV MOD A4C:     66.1 ml LV SV MOD BP:      24.2 ml RIGHT VENTRICLE             IVC RV S prime:     11.70 cm/s  IVC diam: 2.10 cm TAPSE (M-mode): 1.2 cm LEFT ATRIUM             Index        RIGHT ATRIUM           Index LA diam:        3.20 cm 1.52 cm/m   RA Area:     18.20 cm LA Vol (A2C):   28.0 ml 13.30 ml/m  RA Volume:   49.00 ml  23.28 ml/m LA Vol (A4C):   46.0 ml 21.85 ml/m LA Biplane Vol: 37.1 ml 17.62 ml/m  AORTIC VALVE AV Area (Vmax):    2.26 cm AV Area (Vmean):   2.25 cm AV Area (VTI):     2.63 cm AV Vmax:           171.50 cm/s AV Vmean:          112.700 cm/s AV VTI:            0.241 m AV Peak Grad:      11.8 mmHg AV Mean Grad:      6.0 mmHg LVOT Vmax:         85.57 cm/s LVOT Vmean:        55.967 cm/s LVOT VTI:          0.140 m LVOT/AV VTI ratio: 0.58 AI PHT:            1632 msec  AORTA Ao Root diam: 4.00 cm Ao Asc diam:  4.50 cm MITRAL VALVE               TRICUSPID VALVE MV Area (PHT): 4.19 cm    TR Peak grad:   21.9 mmHg MV Decel Time: 181 msec    TR Vmax:        234.00 cm/s MV E velocity: 91.25 cm/s                            SHUNTS                            Systemic  VTI:  0.14 m                            Systemic Diam: 2.40 cm Sheryle Donning MD Electronically signed by Sheryle Donning MD Signature Date/Time: 02/19/2024/7:11:05 PM    Final         Scheduled Meds:  apixaban   5 mg Oral BID   docusate sodium   100 mg Oral BID   finasteride   5 mg Oral Daily   metoprolol  tartrate  100 mg Oral BID   polyethylene glycol  17 g Oral Daily   probenecid   500 mg Oral Daily   tamsulosin   0.4 mg Oral Daily   Continuous Infusions:  diltiazem  (CARDIZEM ) infusion 15 mg/hr (02/20/24 1221)      LOS: 7 days    Time spent: 35 minutes    MDALA-GAUSI, Mcclellan Demarais AGATHA, MD [facility]   If 7PM-7AM, please contact night-coverage [email or web address]  02/20/2024, 2:04 PM

## 2024-02-20 NOTE — Progress Notes (Signed)
 Physical Therapy Treatment Patient Details Name: Franchesco Dubray MRN: [id number] DOB: November 05, 1940 Today's Date: 02/20/2024   History of Present Illness Patient is an 83 y.o. male admitted 02/12/24 with abdominal pain, N&V and increased urination.  Found to be in A-fib w/RVR with CT notable for potential acute cholecystitis. Pt s/p laparoscopic cholecystectomy 5/1. PMH positive for gout, HTN, and A-fib on Eliquis .    PT Comments  Patient's resting HR 80s and slowly progressed supine exercises to standing exercises with max HR 115. Progressed to slow ambulation with HR mostly in 110s (max HR 125). Overall very steady. Noted plans for cardioversion 5/7. Will continue to follow.     If plan is discharge home, recommend the following: A little help with walking and/or transfers;Help with stairs or ramp for entrance;Assistance with cooking/housework;Assist for transportation   Can travel by private vehicle        Equipment Recommendations  None recommended by PT    Recommendations for Other Services       Precautions / Restrictions Precautions Precautions: Fall Recall of Precautions/Restrictions: Intact Restrictions Weight Bearing Restrictions Per Provider Order: No     Mobility  Bed Mobility Overal bed mobility: Modified Independent Bed Mobility: Supine to Sit     Supine to sit: Modified independent (Device/Increase time)          Transfers Overall transfer level: Needs assistance Equipment used: None Transfers: Sit to/from Stand Sit to Stand: Supervision           General transfer comment: Pt stood from lowest bed height by pushing up with BUE support. Good eccentric control with sitting. Supervision for monitoring HR and assure pt not dizzy    Ambulation/Gait Ambulation/Gait assistance: Supervision Gait Distance (Feet): 180 Feet Assistive device: None Gait Pattern/deviations: Step-through pattern, Decreased stride length Gait velocity: reduced     General Gait  Details: Slow, steady gait while monitoring HR. Standing rest x 3 for assessment with max HR 125   Stairs             Wheelchair Mobility     Tilt Bed    Modified Rankin (Stroke Patients Only)       Balance Overall balance assessment: Needs assistance Sitting-balance support: No upper extremity supported, Feet supported Sitting balance-Leahy Scale: Good     Standing balance support: No upper extremity supported, During functional activity Standing balance-Leahy Scale: Good Standing balance comment: feet together eyes closed x 10 sec with slight incr sway (WNL); turn 360 without difficulty                            Communication Communication Communication: Impaired Factors Affecting Communication: Hearing impaired  Cognition Arousal: Alert Behavior During Therapy: WFL for tasks assessed/performed   PT - Cognitive impairments: No apparent impairments                         Following commands: Intact      Cueing Cueing Techniques: Verbal cues  Exercises Low Level/ICU Exercises Ankle Circles/Pumps: AROM, Both, 10 reps Heel Slides: AROM, Both, 10 reps Stabilized Bridging: AROM, Both, 5 reps (rest, 5 reps)    General Comments General comments (skin integrity, edema, etc.): HR supine 87, with exercise 91, with walking 125 max      Pertinent Vitals/Pain Pain Assessment Pain Assessment: No/denies pain    Home Living  Prior Function            PT Goals (current goals can now be found in the care plan section) Acute Rehab PT Goals Patient Stated Goal: Return Home Time For Goal Achievement: 02/27/24 Potential to Achieve Goals: Good Progress towards PT goals: Progressing toward goals    Frequency    Min 2X/week      PT Plan      Co-evaluation              AM-PAC PT "6 Clicks" Mobility   Outcome Measure  Help needed turning from your back to your side while in a flat bed without  using bedrails?: A Little Help needed moving from lying on your back to sitting on the side of a flat bed without using bedrails?: A Little Help needed moving to and from a bed to a chair (including a wheelchair)?: A Little Help needed standing up from a chair using your arms (e.g., wheelchair or bedside chair)?: A Little Help needed to walk in hospital room?: A Little Help needed climbing 3-5 steps with a railing? : A Little 6 Click Score: 18    End of Session Equipment Utilized During Treatment: Gait belt Activity Tolerance: Patient tolerated treatment well Patient left: with call bell/phone within reach;with family/visitor present;in bed Nurse Communication: Mobility status;Other (comment) (max HR 125 (briefly), mostly 110s) PT Visit Diagnosis: Difficulty in walking, not elsewhere classified (R26.2)     Time: 1610-9604 PT Time Calculation (min) (ACUTE ONLY): 16 min  Charges:    $Gait Training: 8-22 mins PT General Charges $$ ACUTE PT VISIT: 1 Visit                      Gayle Kava, PT Acute Rehabilitation Services  Office [phone]    Guilford Leep 02/20/2024, 1:28 PM

## 2024-02-20 NOTE — Plan of Care (Signed)

## 2024-02-20 NOTE — Care Management Important Message (Signed)
 Important Message  Patient Details  Name: Kohen Clemensen MRN: [id number] Date of Birth: 1941/06/20   Important Message Given:  Yes - Medicare IM     Janith Melnick 02/20/2024, 12:30 PM

## 2024-02-21 ENCOUNTER — Inpatient Hospital Stay (HOSPITAL_COMMUNITY): Admitting: Anesthesiology

## 2024-02-21 ENCOUNTER — Encounter (HOSPITAL_COMMUNITY)
Admission: EM | Disposition: A | Discharge status: Home Health | Payer: Self-pay | Source: Home / Self Care | Attending: Internal Medicine

## 2024-02-21 ENCOUNTER — Inpatient Hospital Stay (HOSPITAL_COMMUNITY)

## 2024-02-21 DIAGNOSIS — I361 Nonrheumatic tricuspid (valve) insufficiency: Secondary | ICD-10-CM

## 2024-02-21 DIAGNOSIS — I4891 Unspecified atrial fibrillation: Secondary | ICD-10-CM

## 2024-02-21 DIAGNOSIS — I10 Essential (primary) hypertension: Secondary | ICD-10-CM

## 2024-02-21 DIAGNOSIS — I351 Nonrheumatic aortic (valve) insufficiency: Secondary | ICD-10-CM

## 2024-02-21 DIAGNOSIS — I340 Nonrheumatic mitral (valve) insufficiency: Secondary | ICD-10-CM

## 2024-02-21 DIAGNOSIS — K810 Acute cholecystitis: Secondary | ICD-10-CM | POA: Diagnosis not present

## 2024-02-21 HISTORY — PX: CARDIOVERSION: EP1203

## 2024-02-21 HISTORY — PX: TRANSESOPHAGEAL ECHOCARDIOGRAM (CATH LAB): EP1270

## 2024-02-21 LAB — ECHO TEE
AR max vel: 1.99 cm2
AV Area VTI: 1.73 cm2
AV Area mean vel: 2.54 cm2
AV Mean grad: 3 mmHg
AV Peak grad: 8.9 mmHg
AV Vena cont: 0.52 cm
Ao pk vel: 1.49 m/s

## 2024-02-21 LAB — BASIC METABOLIC PANEL WITH GFR
Anion gap: 10 (ref 5–15)
BUN: 26 mg/dL — ABNORMAL HIGH (ref 8–23)
CO2: 23 mmol/L (ref 22–32)
Calcium: 9.3 mg/dL (ref 8.9–10.3)
Chloride: 106 mmol/L (ref 98–111)
Creatinine, Ser: 1.16 mg/dL (ref 0.61–1.24)
GFR, Estimated: >60 mL/min (ref >60)
Glucose, Bld: 116 mg/dL — ABNORMAL HIGH (ref 70–99)
Potassium: 4.2 mmol/L (ref 3.5–5.1)
Sodium: 139 mmol/L (ref 135–145)

## 2024-02-21 LAB — CBC
HCT: 49.5 % (ref 39.0–52.0)
Hemoglobin: 16.8 g/dL (ref 13.0–17.0)
MCH: 29.6 pg (ref 26.0–34.0)
MCHC: 33.9 g/dL (ref 30.0–36.0)
MCV: 87.1 fL (ref 80.0–100.0)
Platelets: 300 10*3/uL (ref 150–400)
RBC: 5.68 MIL/uL (ref 4.22–5.81)
RDW: 13.6 % (ref 11.5–15.5)
WBC: 7.8 10*3/uL (ref 4.0–10.5)
nRBC: 0 % (ref 0.0–0.2)

## 2024-02-21 LAB — MAGNESIUM: Magnesium: 2 mg/dL (ref 1.7–2.4)

## 2024-02-21 SURGERY — TRANSESOPHAGEAL ECHOCARDIOGRAM (TEE) (CATHLAB)
Anesthesia: Monitor Anesthesia Care

## 2024-02-21 MED ORDER — LOSARTAN POTASSIUM 50 MG PO TABS: 50.0000 mg | ORAL_TABLET | Freq: Every day | ORAL | 2 refills | Status: DC

## 2024-02-21 MED ORDER — SODIUM CHLORIDE 0.9 % IV SOLN: INTRAVENOUS | Status: DC | PRN

## 2024-02-21 MED ORDER — PROPOFOL 10 MG/ML IV BOLUS
INTRAVENOUS | Status: DC | PRN
  Administered 2024-02-21: 50 mg via INTRAVENOUS

## 2024-02-21 MED ORDER — LOSARTAN POTASSIUM 50 MG PO TABS: 50.0000 mg | ORAL_TABLET | Freq: Every day | ORAL | Status: DC

## 2024-02-21 MED ORDER — EPHEDRINE SULFATE-NACL 50-0.9 MG/10ML-% IV SOSY
PREFILLED_SYRINGE | INTRAVENOUS | Status: DC | PRN
  Administered 2024-02-21: 15 mg via INTRAVENOUS
  Administered 2024-02-21: 10 mg via INTRAVENOUS

## 2024-02-21 MED ORDER — METOPROLOL SUCCINATE ER 50 MG PO TB24: 50.0000 mg | ORAL_TABLET | Freq: Every day | ORAL | Status: DC

## 2024-02-21 MED ORDER — PHENYLEPHRINE HCL-NACL 20-0.9 MG/250ML-% IV SOLN
INTRAVENOUS | Status: DC | PRN
  Administered 2024-02-21: 40 ug/min via INTRAVENOUS

## 2024-02-21 MED ORDER — METOPROLOL SUCCINATE ER 50 MG PO TB24
50.0000 mg | ORAL_TABLET | Freq: Every day | ORAL | 2 refills | Status: DC
Start: 2024-02-21 — End: 2024-03-13

## 2024-02-21 MED ORDER — PROPOFOL 500 MG/50ML IV EMUL
INTRAVENOUS | Status: DC | PRN
Start: 2024-02-21 — End: 2024-02-21
  Administered 2024-02-21: 130 ug/kg/min via INTRAVENOUS

## 2024-02-21 MED ORDER — LIDOCAINE HCL (CARDIAC) PF 100 MG/5ML IV SOSY
PREFILLED_SYRINGE | INTRAVENOUS | Status: AC
  Filled 2024-02-21: qty 5

## 2024-02-21 SURGICAL SUPPLY — 1 items: PAD DEFIB RADIO PHYSIO CONN (PAD) ×1 IMPLANT

## 2024-02-21 NOTE — Transfer of Care (Signed)
 Immediate Anesthesia Transfer of Care Note  Patient: Jose Hood  Procedure(s) Performed: TRANSESOPHAGEAL ECHOCARDIOGRAM CARDIOVERSION  Patient Location: Cath Lab  Anesthesia Type:MAC  Level of Consciousness: awake, patient cooperative, and responds to stimulation  Airway & Oxygen Therapy: Patient Spontanous Breathing and Patient connected to nasal cannula oxygen  Post-op Assessment: Report given to RN and Post -op Vital signs reviewed and stable  Post vital signs: Reviewed and stable  Last Vitals:  Vitals Value Taken Time  BP 121/70 02/21/24 1209  Temp 97   Pulse 56 02/21/24 1213  Resp 16 02/21/24 1213  SpO2 97 % 02/21/24 1213  Vitals shown include unfiled device data.  Last Pain:  Vitals:   02/21/24 0930  TempSrc: Temporal  PainSc:       Patients Stated Pain Goal: 0 (02/18/24 1949)  Complications: There were no known notable events for this encounter.

## 2024-02-21 NOTE — Plan of Care (Signed)
  Problem: Education: Goal: Knowledge of General Education information will improve Description: Including pain rating scale, medication(s)/side effects and non-pharmacologic comfort measures Outcome: Completed/Met   Problem: Health Behavior/Discharge Planning: Goal: Ability to manage health-related needs will improve Outcome: Completed/Met   Problem: Clinical Measurements: Goal: Ability to maintain clinical measurements within normal limits will improve Outcome: Completed/Met Goal: Will remain free from infection Outcome: Completed/Met Goal: Diagnostic test results will improve Outcome: Completed/Met Goal: Respiratory complications will improve Outcome: Completed/Met Goal: Cardiovascular complication will be avoided Outcome: Completed/Met   Problem: Activity: Goal: Risk for activity intolerance will decrease Outcome: Completed/Met   Problem: Nutrition: Goal: Adequate nutrition will be maintained Outcome: Completed/Met   Problem: Coping: Goal: Level of anxiety will decrease Outcome: Completed/Met   Problem: Elimination: Goal: Will not experience complications related to bowel motility Outcome: Completed/Met Goal: Will not experience complications related to urinary retention Outcome: Completed/Met   Problem: Pain Managment: Goal: General experience of comfort will improve and/or be controlled Outcome: Completed/Met   Problem: Safety: Goal: Ability to remain free from injury will improve Outcome: Completed/Met   Problem: Skin Integrity: Goal: Risk for impaired skin integrity will decrease Outcome: Completed/Met   Problem: Education: Goal: Knowledge of disease or condition will improve Outcome: Completed/Met Goal: Understanding of medication regimen will improve Outcome: Completed/Met Goal: Individualized Educational Video(s) Outcome: Completed/Met   Problem: Activity: Goal: Ability to tolerate increased activity will improve Outcome: Completed/Met    Problem: Cardiac: Goal: Ability to achieve and maintain adequate cardiopulmonary perfusion will improve Outcome: Completed/Met   Problem: Health Behavior/Discharge Planning: Goal: Ability to safely manage health-related needs after discharge will improve Outcome: Completed/Met

## 2024-02-21 NOTE — Progress Notes (Signed)
  Echocardiogram Echocardiogram Transesophageal has been performed.  Royden Corin 02/21/2024, 12:18 PM

## 2024-02-21 NOTE — Progress Notes (Addendum)
 Progress Note  Patient Name: Jose Hood Date of Encounter: 02/21/2024  Primary Cardiologist: Oneil Bigness, MD   Subjective   Patient is evaluated bedside, laying in bed comfortably, no acute distress.  Patient denies any chest pain, shortness of breath, palpitations.  Denies any dizziness or lightheadedness.  No concerns this morning.  Reports that he is ready to get the cardioversion done today.  Inpatient Medications    Scheduled Meds:  apixaban   5 mg Oral BID   docusate sodium   100 mg Oral BID   finasteride   5 mg Oral Daily   lidocaine  (cardiac) 100 mg/5mL       metoprolol  tartrate  100 mg Oral BID   polyethylene glycol  17 g Oral Daily   probenecid   500 mg Oral Daily   tamsulosin   0.4 mg Oral Daily   Continuous Infusions:  diltiazem  (CARDIZEM ) infusion Stopped (02/21/24 1027)   PRN Meds: acetaminophen , lidocaine  (cardiac) 100 mg/1mL, metoprolol  tartrate, morphine  injection, ondansetron  **OR** ondansetron  (ZOFRAN ) IV, mouth rinse, oxyCODONE    Vital Signs    Vitals:   02/21/24 1245 02/21/24 1253 02/21/24 1300 02/21/24 1320  BP: 104/74 105/73 108/70 114/74  Pulse: 64 61 62   Resp: 18 19 13 16   Temp:      TempSrc:      SpO2: 98% 97% 94% 96%  Weight:      Height:        Intake/Output Summary (Last 24 hours) at 02/21/2024 1600 Last data filed at 02/21/2024 1155 Gross per 24 hour  Intake 610 ml  Output 800 ml  Net -190 ml   Filed Weights   02/12/24 2122 02/13/24 1246 02/15/24 0720  Weight: 86.2 kg 85.7 kg 86.2 kg    Telemetry    Atrial fibrillation with heart rate 70-100s- Personally Reviewed  ECG    No EKG today - Personally Reviewed  Physical Exam   GEN: Laying in bed, no acute distress Cardiac: Irregularly irregular rhythm, no murmurs appreciated Respiratory: Breathing comfortably GI: Soft, nontender, non-distended bowel sounds present MS: No edema; No deformity. Neuro: Awake, participating in conversation, no focal deficits Psych: Normal affect    Labs    Chemistry Recent Labs  Lab 02/16/24 0727 02/17/24 0410 02/18/24 0241 02/20/24 0450 02/21/24 0343  NA 136 138 140 137 139  K 4.2 4.3 4.0 4.1 4.2  CL 106 103 106 105 106  CO2 22 24 25 22 23   GLUCOSE 160* 127* 105* 107* 116*  BUN 18 22 22  32* 26*  CREATININE 1.13 1.14 1.03 1.09 1.16  CALCIUM 8.4* 8.7* 9.0 8.6* 9.3  PROT 6.0* 5.9* 5.5*  --   --   ALBUMIN  2.6* 2.8* 2.7*  --   --   AST 28 27 28   --   --   ALT 19 19 24   --   --   ALKPHOS 46 48 41  --   --   BILITOT 1.2 1.0 0.9  --   --   GFRNONAA >60 >60 >60 >60 >60  ANIONGAP 8 11 9 10 10      Hematology Recent Labs  Lab 02/19/24 0552 02/20/24 0450 02/21/24 0343  WBC 7.1 7.5 7.8  RBC 5.82* 5.73 5.68  HGB 17.2* 17.2* 16.8  HCT 51.2 50.0 49.5  MCV 88.0 87.3 87.1  MCH 29.6 30.0 29.6  MCHC 33.6 34.4 33.9  RDW 13.7 13.8 13.6  PLT 275 272 300    Radiology    EP STUDY Result Date: 02/21/2024 See surgical note for  result.   Cardiac Studies   Echocardiogram 02/19/2024: Impressions: 1. Left ventricular ejection fraction, by estimation, is 45 to 50%. The  left ventricle has mildly decreased function. The left ventricle  demonstrates global hypokinesis. There is moderate left ventricular  hypertrophy. Left ventricular diastolic  parameters are indeterminate.   2. Right ventricular systolic function is moderately reduced. The right  ventricular size is moderately enlarged. Mildly increased right  ventricular wall thickness. There is mildly elevated pulmonary artery  systolic pressure.   3. The mitral valve is grossly normal. Mild mitral valve regurgitation.  No evidence of mitral stenosis.   4. Tricuspid valve regurgitation is mild to moderate.   5. The aortic valve is calcified. There is severe calcifcation of the  aortic valve. Aortic valve regurgitation is mild. Aortic valve sclerosis  is present, with no evidence of aortic valve stenosis.   6. Aortic dilatation noted. There is moderate dilatation of the  ascending  aorta, measuring 45 mm. There is mild dilatation of the aortic root,  measuring 40 mm.   7. The inferior vena cava is dilated in size with <50% respiratory  variability, suggesting right atrial pressure of 15 mmHg.   Comparison(s): Changes from prior study are noted. Now in afib, EF mildly  reduced.   Patient Profile     83 y.o. male  with a history of persistent atrial fibrillation on Eliquis , dilated ascending aorta, hypertension, BPH, gout, and shingles in 2017 who was admitted on 02/12/2024 for acute cholecystis after presenting with abdominal pain. Cardiology was consulted for pre-op evaluation and for assistance with management of atrial fibrillation. He underwent a laparoscopic cholecystectomy on 02/15/2024.   Assessment & Plan    Persistent Atrial Fibrillation  Initially diagnosed at office visit on 01/25/2024. Rate control strategy was recommended given he was completely asymptomatic. He was admitted with acute cholecystitis and was started on IV Diltiazem  on 5/5 due to RVR. Echo showed LVEF of 45-50% with global hypokinesis. Outpatient medication consisted of Lopressor  25 mg BID. CHA2DS2-VASc = 4 (vascular disease, HTN, age x2).  - Potassium 4.2 and Mag 2.0 - TSH WNL last month - HR ~ 80-100s, during evaluation 70s - Stop Lopressor  100 mg BID, Start Toprol  XL 25 mg BID  - Stop IV Diltiazem , will not recommend as a long term medication with underlying HFmrEF   - Continue Eliquis  5 mg twice a day.  - Planned TEE/DCCV today   New diagnosed HFmrEF Echo this admission showed mildly reduced EF of 45-50% as well as moderately enlarged RV with moderately reduced RV function. LV and RV dysfunction new compared to last Echo in 10/2022. Home medication consisted of Toprol  XL 25 mg BID and lisinopril  40 mg daily.  - Stop Lopressor  100mg  BID and Start Toprol -XL 25 mg BID - GDMT consideration:  - Start ARB - Irbesartan 300 mg, then switch to Entresto at outpatient  - Start Beta  Blocker: Toprol  XL 25 mg BID - Consider SGLT2i - Jardiance in the outpatient  - Suspect cardiomyopathy is due to atrial fibrillation with RVR. Can repeat Echo in about 2 months after restoration of sinus rhythm to reassess LV Function.  Mild aortic insuffiency  Mild Mitral Regurgitation Moderate Tricuspid Regusgitation Noted on Echo this admission. - Can continue to monitor as an outpatient.   Hypertension Patient on Amlodipine 10mg  daily and Lisinopril  40mg  daily at home but these were held on admission. Continue to hold to allow for additional rate control. BP well controlled this morning.  -  Stop lopressor  and IV Dilt - Consider starting GDMT above  - Will not recommend amlodipine at discharge, optimize GDMT   Ascending Aortic Aneurysm CTA this admission showed a 4.7cm ascending aortic aneurysm.  - Continue beta-blocker as above. - Will need semi-annual imaging with CTA/ MRA. - Recommend outpatient CT surgery referral.  Otherwise, per primary team - Acute cholecystitis s/p laparoscopic cholecystectomy on 5/1 - BPH - Gout   For questions or updates, please contact [facility] Please consult [email or web address] for contact info under Cardiology/STEMI.      Signed, Lanney Pitts, DO  02/21/2024, 4:00 PM    Attending Note:   The patient was seen and examined.  Agree with assessment and plan as noted above.  Changes made to the above note as needed.  Patient seen and independently examined with Roya Tawkaliyar, DO .   We discussed all aspects of the encounter. I agree with the assessment and plan as stated above.   1.  Atrial fibrillation: The patient had a successful cardioversion.  Will discontinue the Cardizem  drip.  Will discontinue the metoprolol  and start him on Toprol -XL 75 mg a day.  This is a dose equivalent of his previous dose of metoprolol .  Continue Eliquis .   He will follow-up with Dr. Lucy Sack or an APP in the next several weeks.  2. Asc. Aortic aneurism:   follow up CTA in 6 months   3.  HFrEF:  will DC amlodipine,  start Losartan 50 mg Change his metoprolol  to Toprol  XL  We can consider adding further GDMT as OP ( ? Addition of entresto instead of Losartan,  jardiance, spironolactone )   4. HTN:   see above       I have spent a total of 40 minutes with patient reviewing hospital  notes , telemetry, EKGs, labs and examining patient as well as establishing an assessment and plan that was discussed with the patient.  > 50% of time was spent in direct patient care.    [place], Marieta Shorten., MD, [facility] 02/21/2024, 4:50 PM [address]  [address] Office [phone] Pager [phone]

## 2024-02-21 NOTE — CV Procedure (Signed)
   TRANSESOPHAGEAL ECHOCARDIOGRAM GUIDED DIRECT CURRENT CARDIOVERSION  NAME:  Jose Hood    MRN: [id number] DOB:  October 19, 1940    ADMIT DATE: 02/12/2024  INDICATIONS: Symptomatic atrial fibrillation  PROCEDURE:   Informed consent was obtained prior to the procedure. The risks, benefits and alternatives for the procedure were discussed and the patient comprehended these risks.  Risks include, but are not limited to, cough, sore throat, vomiting, nausea, somnolence, esophageal and stomach trauma or perforation, bleeding, low blood pressure, aspiration, pneumonia, infection, trauma to the teeth and death.    After a procedural time-out, the oropharynx was anesthetized and the patient was sedated by the anesthesia service. The transesophageal probe was inserted in the esophagus and stomach without difficulty and multiple views were obtained. Anesthesia was monitored by Dr. Gail Joseph.   COMPLICATIONS:    Complications: No complications Patient tolerated procedure well.  KEY FINDINGS:  LVEF 35-40% Moderate AR Mild MR LA smoke no obvious thrombus.  Full Report to follow.   CARDIOVERSION:     Indications:  Symptomatic Atrial Fibrillation  Procedure Details:  Once the TEE was complete, the patient had the defibrillator pads placed in the anterior and posterior position. Once an appropriate level of sedation was confirmed, the patient was cardioverted x 1 with 200J of biphasic synchronized energy.  The patient converted to NSR.  There were no apparent complications.  The patient had normal neuro status and respiratory status post procedure with vitals stable as recorded elsewhere.  Adequate airway was maintained throughout and vital signs monitored per protocol.  Post-procedure: Gentle IVF for BP. No focal deficits. Wife updated. Further recommendation per rounding cardiology team.   Olinda Bertrand, DO, [facility] [place]  [facility]  Pager: [phone] Office: [phone] 12:51  PM

## 2024-02-21 NOTE — Discharge Summary (Signed)
 Physician Discharge Summary   Patient: Jose Hood MRN: [id number] DOB: June 19, 1941  Admit date:     02/12/2024  Discharge date: 02/21/24  Discharge Physician: MDALA-GAUSI, Jilda Most   PCP: Ronna Coho, MD   Recommendations at discharge:   Follow-up with cardiology  Discharge Diagnoses: Principal Problem:   Acute cholecystitis Active Problems:   Dyslipidemia   Essential hypertension   Paroxysmal atrial fibrillation (HCC)   Atrial fibrillation with RVR (HCC)  Resolved Problems:   * No resolved hospital problems. *  Hospital Course: 82 year old with past medical history significant for paroxysmal A-fib on Eliquis , hypertension, gout presented to the hospital complaining of abdominal pain, nausea and vomiting.  Found to have acute cholecystitis.  General surgery was consulted.  Cardiology was consulted as well for preop clearance.  Patient underwent laparoscopic cholecystectomy 5/1.  He had ongoing A-fib with RVR.   The rest of the hospital course is in problem-based format below:    Paroxysmal A-fib with RVR Cardiology was consulted. Heart rate uncontrolled, in the 150s, started on Cardizem  drip, subsequently discontinued.  Metoprolol  dose increased. Cardizem  drip resumed on 02/19/2024 due to uncontrolled heart rate. Patient remained tachycardic. He successfully underwent TEE/DCCV on 5/7. He has been discharged home on Toprol , apixaban . He will follow-up with cardiology as outpatient.   Acute cholecystitis Presented with abdominal pain.  Ultrasound showed gallbladder wall thickening with sludge. General surgery consulted. HIDA scan was positive. Underwent lap chole on 5/1. Antibiotic discontinued.  IV heparin  discontinued and switched to oral Eliquis . Patient was cleared for discharge by surgery.   Hypertension Treated with metoprolol , Cardizem  gtt. while hospitalized. At discharge, home amlodipine and lisinopril  were discontinued, metoprolol  dose was increased and he  was started on losartan.  BPH Continued with Flomax  and Proscar    Gout Continued with probenecid    Ascending aortic aneurysm measuring 4.7 cm diameter.   Cardiology recommend semi-annual imaging followup by CTA or MRA and referral to cardiothoracic surgery if not already obtained. Ambulatory referral was placed.       Consultants: Cardiology Procedures performed: Laparoscopic cholecystectomy, TEE/DCCV Disposition: Home Diet recommendation:  Discharge Diet Orders (From admission, onward)     Start     Ordered   02/21/24 0000  Diet - low sodium heart healthy        02/21/24 1702           Cardiac diet DISCHARGE MEDICATION: Allergies as of 02/21/2024       Reactions   Oxycodone  Other (See Comments)   Patient states it gave him hallucinations         Medication List     STOP taking these medications    amLODipine 5 MG tablet Commonly known as: NORVASC   lisinopril  40 MG tablet Commonly known as: ZESTRIL    metoprolol  tartrate 25 MG tablet Commonly known as: LOPRESSOR        TAKE these medications    apixaban  5 MG Tabs tablet Commonly known as: ELIQUIS  Take 1 tablet (5 mg total) by mouth 2 (two) times daily.   finasteride  5 MG tablet Commonly known as: PROSCAR  TAKE 1 TABLET BY MOUTH EVERY DAY   losartan 50 MG tablet Commonly known as: COZAAR Take 1 tablet (50 mg total) by mouth daily.   metoprolol  succinate 50 MG 24 hr tablet Commonly known as: TOPROL -XL Take 1 tablet (50 mg total) by mouth daily. Take with or immediately following a meal.   probenecid  500 MG tablet Commonly known as: BENEMID  Take 1 tablet (500 mg total) by  mouth daily.   tamsulosin  0.4 MG Caps capsule Commonly known as: FLOMAX  TAKE ONE CAPSULE BY MOUTH EVERY DAY        Follow-up Information     [facility] Follow up.   Specialty: Home Health Services Why: Physical Therapy-office to call with visit times. Contact information: [address] [place] [address] [phone]         Maczis, Loura Rower, PA-C. Go on 03/12/2024.   Specialty: General Surgery Why: 5/27 at 11:30 am., Please arrive 30 minutes early to complete check in, and bring photo ID and insurance card. Contact information: [address] [facility] [place] [address] [phone]                Discharge Exam: Cleavon Curls Weights   02/12/24 2122 02/13/24 1246 02/15/24 0720  Weight: 86.2 kg 85.7 kg 86.2 kg   Physical Exam on Day of Discharge   General: Alert, cheerful, oriented X3  Oral cavity: moist mucous membranes  Neck: supple  Chest: clear to auscultation. No crackles, no wheezes  CVS: S1,S2 RRR. No murmurs  Abd: No distention, soft, non-tender. No masses palpable  Extr: No edema    Condition at discharge: good  The results of significant diagnostics from this hospitalization (including imaging, microbiology, ancillary and laboratory) are listed below for reference.   Imaging Studies: EP STUDY Result Date: 02/21/2024 See surgical note for result.  ECHOCARDIOGRAM COMPLETE Result Date: 02/19/2024    ECHOCARDIOGRAM REPORT   Patient Name:   Jose Hood Date of Exam: 02/19/2024 Medical Rec #:  [id number]  Height:       73.0 in Accession #:    [id number] Weight:       190.0 lb Date of Birth:  02-15-1941   BSA:          2.105 m Patient Age:    82 years   BP:           121/84 mmHg Patient Gender: M          HR:           93 bpm. Exam Location:  Inpatient Procedure: 2D Echo, Cardiac Doppler and Color Doppler (Both Spectral and Color            Flow Doppler were utilized during procedure). Indications:    I48.91* Unspeicified atrial fibrillation  History:        Patient has prior history of Echocardiogram examinations, most                 recent 11/11/2022. Abnormal ECG, Aortic Valve Disease,                 Arrythmias:Atrial Fibrillation; Risk Factors:Hypertension and                 Dyslipidemia.  Sonographer:    Raynelle Callow RDCS Referring Phys: [id number] Casimer Clear  Sonographer Comments: Technically difficult study due to poor echo windows. Image acquisition challenging due to respiratory motion. IMPRESSIONS  1. Left ventricular ejection fraction, by estimation, is 45 to 50%. The left ventricle has mildly decreased function. The left ventricle demonstrates global hypokinesis. There is moderate left ventricular hypertrophy. Left ventricular diastolic parameters are indeterminate.  2. Right ventricular systolic function is moderately reduced. The right ventricular size is moderately enlarged. Mildly increased right ventricular wall thickness. There is mildly elevated pulmonary artery systolic pressure.  3. The mitral valve is grossly normal. Mild mitral valve regurgitation.  No evidence of mitral stenosis.  4. Tricuspid valve regurgitation is mild to moderate.  5. The aortic valve is calcified. There is severe calcifcation of the aortic valve. Aortic valve regurgitation is mild. Aortic valve sclerosis is present, with no evidence of aortic valve stenosis.  6. Aortic dilatation noted. There is moderate dilatation of the ascending aorta, measuring 45 mm. There is mild dilatation of the aortic root, measuring 40 mm.  7. The inferior vena cava is dilated in size with <50% respiratory variability, suggesting right atrial pressure of 15 mmHg. Comparison(s): Changes from prior study are noted. Now in afib, EF mildly reduced. FINDINGS  Left Ventricle: Left ventricular ejection fraction, by estimation, is 45 to 50%. The left ventricle has mildly decreased function. The left ventricle demonstrates global hypokinesis. The left ventricular internal cavity size was normal in size. There is  moderate left ventricular hypertrophy. Left ventricular diastolic function could not be evaluated due to atrial fibrillation. Left ventricular diastolic parameters are indeterminate. Right Ventricle: The right ventricular size is moderately enlarged.  Mildly increased right ventricular wall thickness. Right ventricular systolic function is moderately reduced. There is mildly elevated pulmonary artery systolic pressure. The tricuspid regurgitant velocity is 2.34 m/s, and with an assumed right atrial pressure of 15 mmHg, the estimated right ventricular systolic pressure is 36.9 mmHg. Left Atrium: Left atrial size was normal in size. Right Atrium: Right atrial size was normal in size. Pericardium: There is no evidence of pericardial effusion. Mitral Valve: The mitral valve is grossly normal. Mild mitral valve regurgitation. No evidence of mitral valve stenosis. Tricuspid Valve: The tricuspid valve is normal in structure. Tricuspid valve regurgitation is mild to moderate. No evidence of tricuspid stenosis. Aortic Valve: The aortic valve is calcified. There is severe calcifcation of the aortic valve. Aortic valve regurgitation is mild. Aortic regurgitation PHT measures 1632 msec. Aortic valve sclerosis is present, with no evidence of aortic valve stenosis. Aortic valve mean gradient measures 6.0 mmHg. Aortic valve peak gradient measures 11.8 mmHg. Aortic valve area, by VTI measures 2.63 cm. Pulmonic Valve: The pulmonic valve was normal in structure. Pulmonic valve regurgitation is trivial. No evidence of pulmonic stenosis. Aorta: Aortic dilatation noted. There is moderate dilatation of the ascending aorta, measuring 45 mm. There is mild dilatation of the aortic root, measuring 40 mm. Venous: The inferior vena cava is dilated in size with less than 50% respiratory variability, suggesting right atrial pressure of 15 mmHg. IAS/Shunts: No atrial level shunt detected by color flow Doppler.  LEFT VENTRICLE PLAX 2D LVIDd:         3.60 cm LVIDs:         2.70 cm LV PW:         1.40 cm LV IVS:        1.40 cm LVOT diam:     2.40 cm LV SV:         63 LV SV Index:   30 LVOT Area:     4.52 cm  LV Volumes (MOD) LV vol d, MOD A2C: 49.3 ml LV vol d, MOD A4C: 66.1 ml LV vol s, MOD  A2C: 27.2 ml LV vol s, MOD A4C: 35.6 ml LV SV MOD A2C:     22.1 ml LV SV MOD A4C:     66.1 ml LV SV MOD BP:      24.2 ml RIGHT VENTRICLE             IVC RV S prime:     11.70 cm/s  IVC diam: 2.10 cm TAPSE (M-mode): 1.2 cm LEFT ATRIUM             Index        RIGHT ATRIUM           Index LA diam:        3.20 cm 1.52 cm/m   RA Area:     18.20 cm LA Vol (A2C):   28.0 ml 13.30 ml/m  RA Volume:   49.00 ml  23.28 ml/m LA Vol (A4C):   46.0 ml 21.85 ml/m LA Biplane Vol: 37.1 ml 17.62 ml/m  AORTIC VALVE AV Area (Vmax):    2.26 cm AV Area (Vmean):   2.25 cm AV Area (VTI):     2.63 cm AV Vmax:           171.50 cm/s AV Vmean:          112.700 cm/s AV VTI:            0.241 m AV Peak Grad:      11.8 mmHg AV Mean Grad:      6.0 mmHg LVOT Vmax:         85.57 cm/s LVOT Vmean:        55.967 cm/s LVOT VTI:          0.140 m LVOT/AV VTI ratio: 0.58 AI PHT:            1632 msec  AORTA Ao Root diam: 4.00 cm Ao Asc diam:  4.50 cm MITRAL VALVE               TRICUSPID VALVE MV Area (PHT): 4.19 cm    TR Peak grad:   21.9 mmHg MV Decel Time: 181 msec    TR Vmax:        234.00 cm/s MV E velocity: 91.25 cm/s                            SHUNTS                            Systemic VTI:  0.14 m                            Systemic Diam: 2.40 cm Sheryle Donning MD Electronically signed by Sheryle Donning MD Signature Date/Time: 02/19/2024/7:11:05 PM    Final    NM Hepatobiliary Liver Func Result Date: 02/13/2024 CLINICAL DATA:  Right upper quadrant abdominal pain, nondiagnostic ultrasound, concern for acalculous cholecystitis EXAM: NUCLEAR MEDICINE HEPATOBILIARY IMAGING TECHNIQUE: Sequential images of the abdomen were obtained out to 60 minutes following intravenous administration of radiopharmaceutical. RADIOPHARMACEUTICALS:  5.2 mCi Tc-45m Choletec  IV, 3 mg of morphine  IV COMPARISON:  Ultrasound 02/12/2024 FINDINGS: There is prompt uptake of radiotracer by the hepatic parenchyma. Normal excretion is seen through the common bile  duct into the small bowel. The gallbladder was not visualized by 60 minutes. Morphine  was then administered per protocol, with continued failure to visualize the gallbladder. IMPRESSION: 1. Failure to visualize the gallbladder despite morphine  administration, consistent with cystic duct obstruction and cholecystitis. 2. No evidence of common bile duct obstruction, with normal excretion of radiotracer into the bowel. Electronically Signed   By: Bobbye Burrow M.D.   On: 02/13/2024 17:41   US  Abdomen Limited RUQ (LIVER/GB) Result Date: 02/12/2024 CLINICAL DATA:  Abdominal pain EXAM: ULTRASOUND ABDOMEN LIMITED RIGHT UPPER QUADRANT  COMPARISON:  CT chest 02/12/2024 and CT abdomen pelvis 07/16/2023 FINDINGS: Gallbladder: Sludge is present within the gallbladder. There is marked wall thickening measuring 10 mm in thickness. A positive sonographic Abigail Abler sign was noted by the sonographer. Common bile duct: Diameter: Not visualized. Liver: No focal lesion identified. Within normal limits in parenchymal echogenicity. Portal vein is patent on color Doppler imaging with normal direction of blood flow towards the liver. Other: None. IMPRESSION: Findings consistent with acute cholecystitis. Electronically Signed   By: Rozell Cornet M.D.   On: 02/12/2024 23:32   CT Angio Chest Aorta w/CM &/OR wo/CM Result Date: 02/12/2024 CLINICAL DATA:  Acute aortic syndrome suspected.  Abdominal pain. EXAM: CT ANGIOGRAPHY CHEST WITH CONTRAST TECHNIQUE: Multidetector CT imaging of the chest was performed using the standard protocol during bolus administration of intravenous contrast. Multiplanar CT image reconstructions and MIPs were obtained to evaluate the vascular anatomy. RADIATION DOSE REDUCTION: This exam was performed according to the departmental dose-optimization program which includes automated exposure control, adjustment of the mA and/or kV according to patient size and/or use of iterative reconstruction technique. CONTRAST:   75mL OMNIPAQUE  IOHEXOL  350 MG/ML SOLN COMPARISON:  09/27/2021 FINDINGS: Cardiovascular: Images are obtained during the pulmonary arterial phase after contrast material administration. There is good opacification of the central and segmental pulmonary arteries. No focal filling defects. No evidence of significant pulmonary embolus. Poor opacification of the aorta during this phase limits evaluation of the aorta 4 dissection or other intrinsic aortic abnormality. There is a ascending thoracic aortic aneurysm measuring 4.7 cm in diameter. Calcification of the aorta and coronary arteries. Normal heart size. No pericardial effusions. Mediastinum/Nodes: No enlarged mediastinal, hilar, or axillary lymph nodes. Thyroid  gland, trachea, and esophagus demonstrate no significant findings. Lungs/Pleura: Motion artifact limits examination. There is evidence of infiltration or atelectasis in both lung bases. No pleural effusion or pneumothorax. Upper Abdomen: Poor definition of the gallbladder wall suggest pericholecystic edema, possibly due to cholecystitis. No stones are demonstrated. Bilateral renal cysts are partially visualized. These are better demonstrated on prior CT abdomen and pelvis from 07/16/2023. No imaging follow-up is indicated. Musculoskeletal: Degenerative changes in the spine. No acute bony abnormalities. Review of the MIP images confirms the above findings. IMPRESSION: 1. Images were obtained during the pulmonary arterial phase. No evidence of significant pulmonary embolus. 2. Ascending aortic aneurysm measuring 4.7 cm diameter. Ascending thoracic aortic aneurysm. Recommend semi-annual imaging followup by CTA or MRA and referral to cardiothoracic surgery if not already obtained. This recommendation follows 2010 ACCF/AHA/AATS/ACR/ASA/SCA/SCAI/SIR/STS/SVM Guidelines for the Diagnosis and Management of Patients With Thoracic Aortic Disease. Circulation. 2010; 121: U981-X914. Aortic aneurysm NOS (ICD10-I71.9) 3.  Infiltration or atelectasis in both lung bases. 4. Poorly defined gallbladder wall suggesting pericholecystic edema. This could indicate cholecystitis in the appropriate clinical setting. Consider ultrasound for further evaluation. 5. Aortic atherosclerosis. Electronically Signed   By: Boyce Byes M.D.   On: 02/12/2024 23:06    Microbiology: Results for orders placed or performed during the hospital encounter of 10/22/10  Fecal lactoferrin     Status: None   Collection Time: 10/22/10  6:04 PM  Result Value Ref Range Status   Specimen Description BUTTOCKS  Final   Special Requests NONE  Final   Fecal Lactoferrin POSITIVE  Final   Report Status 10/23/2010 FINAL  Final  Stool culture     Status: None   Collection Time: 10/22/10  6:04 PM   Specimen: STOOL  Result Value Ref Range Status   Specimen Description STOOL  Final  Special Requests IMMUNE:NORM  Final   Culture   Final    NO SALMONELLA, SHIGELLA, CAMPYLOBACTER, OR YERSINIA ISOLATED   Report Status 10/26/2010 FINAL  Final  Ova and parasite examination     Status: None   Collection Time: 10/22/10  6:04 PM   Specimen: STOOL  Result Value Ref Range Status   Specimen Description STOOL  Final   Special Requests NONE  Final   Ova and parasites NO OVA OR PARASITES SEEN  Final   Report Status 10/25/2010 FINAL  Final    Labs: CBC: Recent Labs  Lab 02/17/24 0410 02/18/24 0241 02/19/24 0552 02/20/24 0450 02/21/24 0343  WBC 8.9 6.5 7.1 7.5 7.8  NEUTROABS 6.9 4.1 4.2  --   --   HGB 15.5 15.8 17.2* 17.2* 16.8  HCT 46.3 47.4 51.2 50.0 49.5  MCV 89.6 88.9 88.0 87.3 87.1  PLT 275 261 275 272 300   Basic Metabolic Panel: Recent Labs  Lab 02/15/24 0443 02/16/24 0727 02/17/24 0410 02/18/24 0241 02/20/24 0450 02/21/24 0343  NA 137 136 138 140 137 139  K 3.5 4.2 4.3 4.0 4.1 4.2  CL 104 106 103 106 105 106  CO2 21* 22 24 25 22 23   GLUCOSE 106* 160* 127* 105* 107* 116*  BUN 16 18 22 22  32* 26*  CREATININE 1.07 1.13 1.14  1.03 1.09 1.16  CALCIUM 8.4* 8.4* 8.7* 9.0 8.6* 9.3  MG 2.0 2.1 2.3 2.1 2.1 2.0  PHOS 2.6  --   --   --   --   --    Liver Function Tests: Recent Labs  Lab 02/15/24 0443 02/16/24 0727 02/17/24 0410 02/18/24 0241  AST 16 28 27 28   ALT 11 19 19 24   ALKPHOS 47 46 48 41  BILITOT 2.1* 1.2 1.0 0.9  PROT 5.7* 6.0* 5.9* 5.5*  ALBUMIN  2.5* 2.6* 2.8* 2.7*   CBG: No results for input(s): "GLUCAP" in the last 168 hours.  Discharge time spent: greater than 30 minutes.  Signed: MDALA-GAUSI, Baldomero Mirarchi AGATHA, MD [facility] 02/21/2024

## 2024-02-21 NOTE — Anesthesia Postprocedure Evaluation (Signed)
 Anesthesia Post Note  Patient: Jose Hood  Procedure(s) Performed: TRANSESOPHAGEAL ECHOCARDIOGRAM CARDIOVERSION     Patient location during evaluation: Cath Lab Anesthesia Type: MAC Level of consciousness: awake and alert Pain management: pain level controlled Vital Signs Assessment: post-procedure vital signs reviewed and stable Respiratory status: spontaneous breathing, nonlabored ventilation, respiratory function stable and patient connected to nasal cannula oxygen Cardiovascular status: stable and blood pressure returned to baseline Postop Assessment: no apparent nausea or vomiting Anesthetic complications: no  There were no known notable events for this encounter.  Last Vitals:  Vitals:   02/21/24 1300 02/21/24 1320  BP: 108/70 114/74  Pulse: 62   Resp: 13 16  Temp:    SpO2: 94% 96%    Last Pain:  Vitals:   02/21/24 1230  TempSrc: Temporal  PainSc:                  Jose Hood

## 2024-02-21 NOTE — Progress Notes (Signed)
 DISCHARGE NOTE HOME Elston Hornaday to be discharged Home per MD order. Discussed prescriptions and follow up appointments with the patient. Prescriptions given to patient; medication list explained in detail. Patient verbalized understanding.  Skin clean, dry and intact without evidence of skin break down, no evidence of skin tears noted. IV catheter discontinued intact. Site without signs and symptoms of complications. Dressing and pressure applied. Pt denies pain at the site currently. No complaints noted.  Patient free of lines, drains, and wounds.   An After Visit Summary (AVS) was printed and given to the patient. Patient escorted via wheelchair, and discharged home via private auto.  Tonda Francisco, RN

## 2024-02-21 NOTE — Interval H&P Note (Signed)
 History and Physical Interval Note:  02/21/2024 10:25 AM  Jose Hood  has presented today for surgery, with the diagnosis of afib.  The various methods of treatment have been discussed with the patient and family. After consideration of risks, benefits and other options for treatment, the patient has consented to  Procedure(s): TRANSESOPHAGEAL ECHOCARDIOGRAM (N/A) CARDIOVERSION (N/A) as a surgical intervention.  The patient's history has been reviewed, patient examined, no change in status, stable for surgery.  I have reviewed the patient's chart and labs.  Questions were answered to the patient's satisfaction.    Informed Consent   Shared Decision Making/Informed Consent   The risks [stroke, cardiac arrhythmias rarely resulting in the need for a temporary or permanent pacemaker, skin irritation or burns, esophageal damage, perforation (1:10,000 risk), bleeding, pharyngeal hematoma as well as other potential complications associated with conscious sedation including aspiration, arrhythmia, respiratory failure and death], benefits (treatment guidance, restoration of normal sinus rhythm, diagnostic support) and alternatives of a transesophageal echocardiogram guided cardioversion were discussed in detail with Mr. Maddex and he is willing to proceed.     Contact person: Wife.   Deneise Getty Albert Huff, DO, [facility] [place]  [facility]

## 2024-02-22 ENCOUNTER — Encounter (HOSPITAL_COMMUNITY): Payer: Self-pay | Admitting: Cardiology

## 2024-02-26 DIAGNOSIS — Z556 Problems related to health literacy: Secondary | ICD-10-CM | POA: Diagnosis not present

## 2024-02-26 DIAGNOSIS — Z860100 Personal history of colon polyps, unspecified: Secondary | ICD-10-CM | POA: Diagnosis not present

## 2024-02-26 DIAGNOSIS — M1990 Unspecified osteoarthritis, unspecified site: Secondary | ICD-10-CM | POA: Diagnosis not present

## 2024-02-26 DIAGNOSIS — E7800 Pure hypercholesterolemia, unspecified: Secondary | ICD-10-CM | POA: Diagnosis not present

## 2024-02-26 DIAGNOSIS — I480 Paroxysmal atrial fibrillation: Secondary | ICD-10-CM | POA: Diagnosis not present

## 2024-02-26 DIAGNOSIS — K810 Acute cholecystitis: Secondary | ICD-10-CM | POA: Diagnosis not present

## 2024-02-26 DIAGNOSIS — M109 Gout, unspecified: Secondary | ICD-10-CM | POA: Diagnosis not present

## 2024-02-26 DIAGNOSIS — I10 Essential (primary) hypertension: Secondary | ICD-10-CM | POA: Diagnosis not present

## 2024-02-26 DIAGNOSIS — Z48815 Encounter for surgical aftercare following surgery on the digestive system: Secondary | ICD-10-CM | POA: Diagnosis not present

## 2024-02-26 DIAGNOSIS — Z9049 Acquired absence of other specified parts of digestive tract: Secondary | ICD-10-CM | POA: Diagnosis not present

## 2024-02-26 DIAGNOSIS — Z7901 Long term (current) use of anticoagulants: Secondary | ICD-10-CM | POA: Diagnosis not present

## 2024-03-04 DIAGNOSIS — I10 Essential (primary) hypertension: Secondary | ICD-10-CM | POA: Diagnosis not present

## 2024-03-04 DIAGNOSIS — Z860100 Personal history of colon polyps, unspecified: Secondary | ICD-10-CM | POA: Diagnosis not present

## 2024-03-04 DIAGNOSIS — M109 Gout, unspecified: Secondary | ICD-10-CM | POA: Diagnosis not present

## 2024-03-04 DIAGNOSIS — I480 Paroxysmal atrial fibrillation: Secondary | ICD-10-CM | POA: Diagnosis not present

## 2024-03-04 DIAGNOSIS — E7800 Pure hypercholesterolemia, unspecified: Secondary | ICD-10-CM | POA: Diagnosis not present

## 2024-03-04 DIAGNOSIS — Z556 Problems related to health literacy: Secondary | ICD-10-CM | POA: Diagnosis not present

## 2024-03-04 DIAGNOSIS — Z48815 Encounter for surgical aftercare following surgery on the digestive system: Secondary | ICD-10-CM | POA: Diagnosis not present

## 2024-03-04 DIAGNOSIS — K810 Acute cholecystitis: Secondary | ICD-10-CM | POA: Diagnosis not present

## 2024-03-04 DIAGNOSIS — M1990 Unspecified osteoarthritis, unspecified site: Secondary | ICD-10-CM | POA: Diagnosis not present

## 2024-03-04 DIAGNOSIS — Z7901 Long term (current) use of anticoagulants: Secondary | ICD-10-CM | POA: Diagnosis not present

## 2024-03-04 DIAGNOSIS — Z9049 Acquired absence of other specified parts of digestive tract: Secondary | ICD-10-CM | POA: Diagnosis not present

## 2024-03-07 ENCOUNTER — Other Ambulatory Visit (HOSPITAL_COMMUNITY)

## 2024-03-08 DIAGNOSIS — M1990 Unspecified osteoarthritis, unspecified site: Secondary | ICD-10-CM | POA: Diagnosis not present

## 2024-03-08 DIAGNOSIS — I480 Paroxysmal atrial fibrillation: Secondary | ICD-10-CM | POA: Diagnosis not present

## 2024-03-08 DIAGNOSIS — K810 Acute cholecystitis: Secondary | ICD-10-CM | POA: Diagnosis not present

## 2024-03-08 DIAGNOSIS — I10 Essential (primary) hypertension: Secondary | ICD-10-CM | POA: Diagnosis not present

## 2024-03-08 DIAGNOSIS — Z48815 Encounter for surgical aftercare following surgery on the digestive system: Secondary | ICD-10-CM | POA: Diagnosis not present

## 2024-03-08 DIAGNOSIS — Z9049 Acquired absence of other specified parts of digestive tract: Secondary | ICD-10-CM | POA: Diagnosis not present

## 2024-03-08 DIAGNOSIS — E7800 Pure hypercholesterolemia, unspecified: Secondary | ICD-10-CM | POA: Diagnosis not present

## 2024-03-08 DIAGNOSIS — Z860100 Personal history of colon polyps, unspecified: Secondary | ICD-10-CM | POA: Diagnosis not present

## 2024-03-08 DIAGNOSIS — Z7901 Long term (current) use of anticoagulants: Secondary | ICD-10-CM | POA: Diagnosis not present

## 2024-03-08 DIAGNOSIS — M109 Gout, unspecified: Secondary | ICD-10-CM | POA: Diagnosis not present

## 2024-03-08 DIAGNOSIS — Z556 Problems related to health literacy: Secondary | ICD-10-CM | POA: Diagnosis not present

## 2024-03-11 NOTE — Progress Notes (Unsigned)
 Cardiology Office Note    Date:  03/13/2024  ID:  [name], DOB 11/25/1940, MRN [id number] PCP:  Ronna Coho, MD  Cardiologist:  Oneil Bigness, MD  Electrophysiologist:  None   Chief Complaint: Hospital follow up for atrial fibrillation   History of Present Illness: .    Jose Hood is a 83 y.o. male with visit-pertinent history of hypertension, aortic dilation, AI, atrial fibrillation.  Patient was last seen in clinic on/07/2024 by Dr. Rolm Clos.  Patient was noted to be in new atrial fibrillation, he was asymptomatic.  Given lack of symptoms it was elected that he continue with rate control strategy.  He was started on Eliquis  5 mg twice daily and metoprolol  tartrate 25 mg twice daily.  On 02/12/2024 patient presented to the emergency department with acute cholecystitis was also found to be in atrial fibrillation with mild RVR.  He was initially started on diltiazem  drip.  Cardiology was consulted for preoperative cardiac evaluation.  Patient underwent laparoscopic cholecystectomy on 02/15/2024.  Following procedure there was difficulty in keeping his heart rate controlled, echocardiogram on 02/19/2024 indicated LVEF of 45 to 50%, LV with global hypokinesis, moderate LVH, diastolic parameters were indeterminate.  RV systolic function was moderately reduced, RV was moderately enlarged, mildly elevated pulmonary artery systolic pressures.  On 02/21/2024 patient underwent TEE/DCCV, he was discharged on Toprol  and Eliquis .  TEE indicated LVEF of 40 to 45%, LV with global hypokinesis, RV systolic function was mildly reduced, mildly enlarged left atrial size was mildly dilated, no left atrial/atrial appendage thrombus was detected, a small pericardial effusion was present localized near the right ventricle, there is mild mitral regurgitation with no evidence of mitral stenosis, aortic valve regurgitation was moderate to severe, no evidence of aortic valve stenosis.  At discharge patient's home amlodipine and  lisinopril  were discontinued, he was started on metoprolol  and losartan .  Today he presents for follow up, he reports that he is doing very well. He denies chest pain, shortness of breath, lower extremity edema, orthopnea or pnd.  He denies any palpitations or feeling like his heart rate has been fast.  Patient notes that when he was in the hospital and his heart rate would increase he would overall feel very weak.  He denies any feeling of this since he left the hospital.  He denies any abdominal pain and reports that he is tolerating food well.  He reports that he is currently working with physical therapy a few times a week at home, tolerating well.  Patient denies any weight fluctuations and reports that he is tolerating the medications well.  Labwork independently reviewed: 02/21/2024: Sodium 139, potassium 4.2, creatinine 1.16, magnesium  2.0, hemoglobin 16.8, hematocrit 49.5 ROS: .   Today he denies chest pain, shortness of breath, lower extremity edema, fatigue, palpitations, melena, hematuria, hemoptysis, diaphoresis, weakness, presyncope, syncope, orthopnea, and PND.  All other systems are reviewed and otherwise negative. Studies Reviewed: Jose Hood   EKG:  EKG is ordered today, personally reviewed, demonstrating  EKG Interpretation Date/Time:  Wednesday Mar 13 2024 14:25:35 EDT Ventricular Rate:  73 PR Interval:  152 QRS Duration:  78 QT Interval:  370 QTC Calculation: 407 R Axis:   80  Text Interpretation: Normal sinus rhythm Normal ECG When compared with ECG of 21-Feb-2024 12:23, No significant change was found Confirmed by Suraj Ramdass [phone]) on 03/13/2024 2:29:12 PM   CV Studies: Cardiac studies reviewed are outlined and summarized above. Otherwise please see EMR for full report. Cardiac Studies & Procedures  ______________________________________________________________________________________________     ECHOCARDIOGRAM  ECHOCARDIOGRAM COMPLETE 02/19/2024  Narrative ECHOCARDIOGRAM  REPORT    Patient Name:   Jose Hood Date of Exam: 02/19/2024 Medical Rec #:  [id number]  Height:       73.0 in Accession #:    [id number] Weight:       190.0 lb Date of Birth:  08-25-1941   BSA:          2.105 m Patient Age:    82 years   BP:           121/84 mmHg Patient Gender: M          HR:           93 bpm. Exam Location:  Inpatient  Procedure: 2D Echo, Cardiac Doppler and Color Doppler (Both Spectral and Color Flow Doppler were utilized during procedure).  Indications:    I48.91* Unspeicified atrial fibrillation  History:        Patient has prior history of Echocardiogram examinations, most recent 11/11/2022. Abnormal ECG, Aortic Valve Disease, Arrythmias:Atrial Fibrillation; Risk Factors:Hypertension and Dyslipidemia.  Sonographer:    Raynelle Callow RDCS Referring Phys: [id number] Casimer Clear   Sonographer Comments: Technically difficult study due to poor echo windows. Image acquisition challenging due to respiratory motion. IMPRESSIONS   1. Left ventricular ejection fraction, by estimation, is 45 to 50%. The left ventricle has mildly decreased function. The left ventricle demonstrates global hypokinesis. There is moderate left ventricular hypertrophy. Left ventricular diastolic parameters are indeterminate. 2. Right ventricular systolic function is moderately reduced. The right ventricular size is moderately enlarged. Mildly increased right ventricular wall thickness. There is mildly elevated pulmonary artery systolic pressure. 3. The mitral valve is grossly normal. Mild mitral valve regurgitation. No evidence of mitral stenosis. 4. Tricuspid valve regurgitation is mild to moderate. 5. The aortic valve is calcified. There is severe calcifcation of the aortic valve. Aortic valve regurgitation is mild. Aortic valve sclerosis is present, with no evidence of aortic valve stenosis. 6. Aortic dilatation noted. There is moderate dilatation of the ascending aorta, measuring 45 mm. There  is mild dilatation of the aortic root, measuring 40 mm. 7. The inferior vena cava is dilated in size with <50% respiratory variability, suggesting right atrial pressure of 15 mmHg.  Comparison(s): Changes from prior study are noted. Now in afib, EF mildly reduced.  FINDINGS Left Ventricle: Left ventricular ejection fraction, by estimation, is 45 to 50%. The left ventricle has mildly decreased function. The left ventricle demonstrates global hypokinesis. The left ventricular internal cavity size was normal in size. There is moderate left ventricular hypertrophy. Left ventricular diastolic function could not be evaluated due to atrial fibrillation. Left ventricular diastolic parameters are indeterminate.  Right Ventricle: The right ventricular size is moderately enlarged. Mildly increased right ventricular wall thickness. Right ventricular systolic function is moderately reduced. There is mildly elevated pulmonary artery systolic pressure. The tricuspid regurgitant velocity is 2.34 m/s, and with an assumed right atrial pressure of 15 mmHg, the estimated right ventricular systolic pressure is 36.9 mmHg.  Left Atrium: Left atrial size was normal in size.  Right Atrium: Right atrial size was normal in size.  Pericardium: There is no evidence of pericardial effusion.  Mitral Valve: The mitral valve is grossly normal. Mild mitral valve regurgitation. No evidence of mitral valve stenosis.  Tricuspid Valve: The tricuspid valve is normal in structure. Tricuspid valve regurgitation is mild to moderate. No evidence of tricuspid stenosis.  Aortic Valve: The aortic valve is calcified. There  is severe calcifcation of the aortic valve. Aortic valve regurgitation is mild. Aortic regurgitation PHT measures 1632 msec. Aortic valve sclerosis is present, with no evidence of aortic valve stenosis. Aortic valve mean gradient measures 6.0 mmHg. Aortic valve peak gradient measures 11.8 mmHg. Aortic valve area, by VTI  measures 2.63 cm.  Pulmonic Valve: The pulmonic valve was normal in structure. Pulmonic valve regurgitation is trivial. No evidence of pulmonic stenosis.  Aorta: Aortic dilatation noted. There is moderate dilatation of the ascending aorta, measuring 45 mm. There is mild dilatation of the aortic root, measuring 40 mm.  Venous: The inferior vena cava is dilated in size with less than 50% respiratory variability, suggesting right atrial pressure of 15 mmHg.  IAS/Shunts: No atrial level shunt detected by color flow Doppler.   LEFT VENTRICLE PLAX 2D LVIDd:         3.60 cm LVIDs:         2.70 cm LV PW:         1.40 cm LV IVS:        1.40 cm LVOT diam:     2.40 cm LV SV:         63 LV SV Index:   30 LVOT Area:     4.52 cm  LV Volumes (MOD) LV vol d, MOD A2C: 49.3 ml LV vol d, MOD A4C: 66.1 ml LV vol s, MOD A2C: 27.2 ml LV vol s, MOD A4C: 35.6 ml LV SV MOD A2C:     22.1 ml LV SV MOD A4C:     66.1 ml LV SV MOD BP:      24.2 ml  RIGHT VENTRICLE             IVC RV S prime:     11.70 cm/s  IVC diam: 2.10 cm TAPSE (M-mode): 1.2 cm  LEFT ATRIUM             Index        RIGHT ATRIUM           Index LA diam:        3.20 cm 1.52 cm/m   RA Area:     18.20 cm LA Vol (A2C):   28.0 ml 13.30 ml/m  RA Volume:   49.00 ml  23.28 ml/m LA Vol (A4C):   46.0 ml 21.85 ml/m LA Biplane Vol: 37.1 ml 17.62 ml/m AORTIC VALVE AV Area (Vmax):    2.26 cm AV Area (Vmean):   2.25 cm AV Area (VTI):     2.63 cm AV Vmax:           171.50 cm/s AV Vmean:          112.700 cm/s AV VTI:            0.241 m AV Peak Grad:      11.8 mmHg AV Mean Grad:      6.0 mmHg LVOT Vmax:         85.57 cm/s LVOT Vmean:        55.967 cm/s LVOT VTI:          0.140 m LVOT/AV VTI ratio: 0.58 AI PHT:            1632 msec  AORTA Ao Root diam: 4.00 cm Ao Asc diam:  4.50 cm  MITRAL VALVE               TRICUSPID VALVE MV Area (PHT): 4.19 cm    TR Peak grad:   21.9 mmHg MV  Decel Time: 181 msec    TR Vmax:         234.00 cm/s MV E velocity: 91.25 cm/s SHUNTS Systemic VTI:  0.14 m Systemic Diam: 2.40 cm  Sheryle Donning MD Electronically signed by Sheryle Donning MD Signature Date/Time: 02/19/2024/7:11:05 PM    Final   TEE  ECHO TEE 02/21/2024  Narrative TRANSESOPHOGEAL ECHO REPORT    Patient Name:   Jose Hood Date of Exam: 02/21/2024 Medical Rec #:  [id number]  Height:       73.0 in Accession #:    [id number] Weight:       190.0 lb Date of Birth:  [birth year]   BSA:          2.105 m Patient Age:    82 years   BP:           127/79 mmHg Patient Gender: M          HR:           85 bpm. Exam Location:  Inpatient  Procedure: Transesophageal Echo, Cardiac Doppler and Color Doppler (Both Spectral and Color Flow Doppler were utilized during procedure).  Indications:     I48.91* Unspeicified atrial fibrillation  History:         Patient has prior history of Echocardiogram examinations, most recent 02/19/2024. Abnormal ECG, Arrythmias:Atrial Fibrillation; Risk Factors:Hypertension and Dyslipidemia.  Sonographer:     Raynelle Callow RDCS Referring Phys:  [id number] Love Rubens GOODRICH Diagnosing Phys: Olinda Bertrand  PROCEDURE: After discussion of the risks and benefits of a TEE, an informed consent was obtained from the patient. TEE procedure time was 28 minutes. The transesophogeal probe was passed without difficulty through the esophogus of the patient. Imaged were obtained with the patient in a supine position. Sedation performed by different physician. The patient was monitored while under deep sedation. Anesthestetic sedation was provided intravenously by Anesthesiology: 429mg  of Propofol . Image quality was good. The patient's vital signs; including heart rate, blood pressure, and oxygen  saturation; remained stable throughout the procedure. Supplementary images were obtained from transthoracic windows as indicated to answer the clinical question. The patient developed no complications during the  procedure. A successful direct current cardioversion was performed at 200 joules with 1 attempt.  IMPRESSIONS   1. Left ventricular ejection fraction, by estimation, is 40 to 45%. The left ventricle has mildly decreased function. The left ventricle demonstrates global hypokinesis. 2. Right ventricular systolic function is mildly reduced. The right ventricular size is mildly enlarged. Mildly increased right ventricular wall thickness. 3. Left atrial size was mildly dilated. No left atrial/left atrial appendage thrombus was detected. The LAA emptying velocity was 18 cm/s. 4. A small pericardial effusion is present. The pericardial effusion is localized near the right ventricle. 5. The mitral valve is normal in structure. Mild mitral valve regurgitation. No evidence of mitral stenosis. 6. The aortic valve is tricuspid. Aortic valve regurgitation is moderate to severe. Aortic valve sclerosis is present, with no evidence of aortic valve stenosis. 7. Aortic dilatation noted. There is dilatation of the ascending aorta, measuring 44 mm. There is dilatation of the aortic root, measuring 44 mm. There is mild (Grade II) layered plaque involving the descending aorta and aortic arch. 8. Agitated saline contrast bubble study was negative, with no evidence of any interatrial shunt.  Conclusion(s)/Recommendation(s): No LA/LAA thrombus identified. Successful cardioversion performed with restoration of normal sinus rhythm.  FINDINGS Left Ventricle: Left ventricular ejection fraction, by estimation, is 40 to 45%. The left ventricle has mildly decreased  function. The left ventricle demonstrates global hypokinesis. The left ventricular internal cavity size was normal in size.  Right Ventricle: The right ventricular size is mildly enlarged. Mildly increased right ventricular wall thickness. Right ventricular systolic function is mildly reduced.  Left Atrium: Left atrial size was mildly dilated. Spontaneous echo  contrast was present in the left atrium. No left atrial/left atrial appendage thrombus was detected. The LAA emptying velocity was 18 cm/s.  Right Atrium: Right atrial size was normal in size.  Pericardium: A small pericardial effusion is present. The pericardial effusion is localized near the right ventricle.  Mitral Valve: The mitral valve is normal in structure. Mild mitral valve regurgitation. No evidence of mitral valve stenosis.  Tricuspid Valve: The tricuspid valve is normal in structure. Tricuspid valve regurgitation is mild . No evidence of tricuspid stenosis.  Aortic Valve: The aortic valve is tricuspid. Aortic valve regurgitation is moderate to severe. Aortic valve sclerosis is present, with no evidence of aortic valve stenosis. Aortic valve mean gradient measures 3.0 mmHg. Aortic valve peak gradient measures 8.9 mmHg. Aortic valve area, by VTI measures 1.73 cm.  Pulmonic Valve: The pulmonic valve was normal in structure. Pulmonic valve regurgitation is trivial. No evidence of pulmonic stenosis.  Aorta: Aortic dilatation noted. There is dilatation of the ascending aorta, measuring 44 mm. There is dilatation of the aortic root, measuring 44 mm. There is mild (Grade II) layered plaque involving the descending aorta and aortic arch.  IAS/Shunts: No atrial level shunt detected by color flow Doppler. Agitated saline contrast bubble study was negative, with no evidence of any interatrial shunt.  Additional Comments: Spectral Doppler performed.  LEFT VENTRICLE PLAX 2D LVOT diam:     2.20 cm LV SV:         37 LV SV Index:   18 LVOT Area:     3.80 cm   AORTIC VALVE AV Area (Vmax):    1.99 cm AV Area (Vmean):   2.54 cm AV Area (VTI):     1.73 cm AV Vmax:           149.00 cm/s AV Vmean:          81.200 cm/s AV VTI:            0.213 m AV Peak Grad:      8.9 mmHg AV Mean Grad:      3.0 mmHg LVOT Vmax:         78.00 cm/s LVOT Vmean:        54.300 cm/s LVOT VTI:           0.097 m LVOT/AV VTI ratio: 0.46 AR Vena Contracta: 0.52 cm  AORTA Ao Root diam: 4.40 cm Ao Asc diam:  4.40 cm   SHUNTS Systemic VTI:  0.10 m Systemic Diam: 2.20 cm  Sunit Tolia Electronically signed by Olinda Bertrand Signature Date/Time: 02/21/2024/6:16:50 PM    Final        ______________________________________________________________________________________________       Current Reported Medications:.    Current Meds  Medication Sig   apixaban  (ELIQUIS ) 5 MG TABS tablet Take 1 tablet (5 mg total) by mouth 2 (two) times daily.   finasteride  (PROSCAR ) 5 MG tablet TAKE 1 TABLET BY MOUTH EVERY DAY   losartan  (COZAAR ) 50 MG tablet Take 1 tablet (50 mg total) by mouth daily.   metoprolol  succinate (TOPROL -XL) 50 MG 24 hr tablet Take 1 tablet (50 mg total) by mouth daily. Take with or immediately following a meal.   probenecid  (  BENEMID ) 500 MG tablet Take 1 tablet (500 mg total) by mouth daily.   tamsulosin  (FLOMAX ) 0.4 MG CAPS capsule TAKE ONE CAPSULE BY MOUTH EVERY DAY    Physical Exam:    VS:  BP 120/82   Pulse 73   Ht 6\' 1"  (1.854 m)   Wt 191 lb (86.6 kg)   SpO2 96%   BMI 25.20 kg/m    Wt Readings from Last 3 Encounters:  03/13/24 191 lb (86.6 kg)  02/15/24 190 lb (86.2 kg)  01/25/24 194 lb (88 kg)    GEN: Well nourished, well developed in no acute distress NECK: No JVD; No carotid bruits CARDIAC: RRR, no murmurs, rubs, gallops RESPIRATORY:  Clear to auscultation without rales, wheezing or rhonchi  ABDOMEN: Soft, non-tender, non-distended EXTREMITIES:  No edema; No acute deformity     Asessement and Plan:.    Persistent atrial fibrillation: Patient seen in clinic on 01/25/2024, noted to be in new atrial fibrillation, as he was asymptomatic rate control strategy was selected.  However on 02/12/2024 patient presented with acute cholecystitis and found to be in A-fib with mild RVR.  Underwent TEE/DCCV on 02/21/2024, discharged on Toprol  and Eliquis .  EKG today  indicates sinus rhythm.  Patient denies any palpitations or feeling of increased heart rates, notes that he has felt better being back in sinus rhythm.  He notes that when his heart rate would increase in A-fib he would feel extremely weak.  Patient denies any significant bleeding on Eliquis , reports he is tolerating medications well.  Patient agreeable to wearing a 2-week cardiac monitor to reassess A-fib burden. Check BMET.   HF: TEE indicated LVEF of 40 to 45%, LV with global hypokinesis, RV systolic function was mildly reduced, mildly enlarged left atrial size was mildly dilated, no left atrial/atrial appendage thrombus was detected, a small pericardial effusion was present localized near the right ventricle.  While in hospital was felt that reduction in EF was related to atrial fibrillation with RVR, recommended repeating echocardiogram in 2 to 3 months if patient maintains sinus rhythm.  He denies any increased shortness of breath, lower extremity edema, orthopnea or PND.  Patient appears euvolemic and well compensated on exam.  Patient deferred any medication changes at this time, is agreeable if he has no improvement in EF on follow-up echoes.  Encouraged patient to monitor for increased lower extremity edema, increased shortness of breath, weight gain of 2 to 3 pounds or night or 5 pounds in a week. Anticipate recheck of echo in 2-3 months. Check BMET.  HTN: Blood pressure today  120/82.  Continue losartan  50 mg daily. Check BMET.   Aortic insufficiency: Echo on 02/19/2024 indicated mild aortic valve regurgitation, TEE on 5/7 noted to have moderate to severe aortic valve regurgitation with no evidence of stenosis.  Today patient appears euvolemic and well compensated.  He denies any chest pain or increased shortness of breath or lower extremity edema.  Will continue to monitor on future echocardiograms.  AAA: Noted to be 4.7 cm in diameter.  Recommend semiannual imaging with CTA/MRA, patient has been  referred to cardiothoracic surgery.    Disposition: F/u with Clifford Benninger, NP in 8 weeks.   Signed, Keirstyn Aydt D Dylon Correa, NP

## 2024-03-12 DIAGNOSIS — Z7901 Long term (current) use of anticoagulants: Secondary | ICD-10-CM | POA: Diagnosis not present

## 2024-03-12 DIAGNOSIS — Z48815 Encounter for surgical aftercare following surgery on the digestive system: Secondary | ICD-10-CM | POA: Diagnosis not present

## 2024-03-12 DIAGNOSIS — K810 Acute cholecystitis: Secondary | ICD-10-CM | POA: Diagnosis not present

## 2024-03-12 DIAGNOSIS — E7800 Pure hypercholesterolemia, unspecified: Secondary | ICD-10-CM | POA: Diagnosis not present

## 2024-03-12 DIAGNOSIS — I10 Essential (primary) hypertension: Secondary | ICD-10-CM | POA: Diagnosis not present

## 2024-03-12 DIAGNOSIS — M1990 Unspecified osteoarthritis, unspecified site: Secondary | ICD-10-CM | POA: Diagnosis not present

## 2024-03-12 DIAGNOSIS — Z860100 Personal history of colon polyps, unspecified: Secondary | ICD-10-CM | POA: Diagnosis not present

## 2024-03-12 DIAGNOSIS — Z556 Problems related to health literacy: Secondary | ICD-10-CM | POA: Diagnosis not present

## 2024-03-12 DIAGNOSIS — M109 Gout, unspecified: Secondary | ICD-10-CM | POA: Diagnosis not present

## 2024-03-12 DIAGNOSIS — Z9049 Acquired absence of other specified parts of digestive tract: Secondary | ICD-10-CM | POA: Diagnosis not present

## 2024-03-12 DIAGNOSIS — I480 Paroxysmal atrial fibrillation: Secondary | ICD-10-CM | POA: Diagnosis not present

## 2024-03-13 ENCOUNTER — Ambulatory Visit: Attending: Cardiology | Admitting: Cardiology

## 2024-03-13 ENCOUNTER — Ambulatory Visit: Attending: Cardiology

## 2024-03-13 ENCOUNTER — Encounter: Payer: Self-pay | Admitting: Cardiology

## 2024-03-13 VITALS — BP 120/82 | HR 73 | Ht 73.0 in | Wt 191.0 lb

## 2024-03-13 DIAGNOSIS — I4819 Other persistent atrial fibrillation: Secondary | ICD-10-CM

## 2024-03-13 DIAGNOSIS — I10 Essential (primary) hypertension: Secondary | ICD-10-CM | POA: Diagnosis not present

## 2024-03-13 DIAGNOSIS — I77819 Aortic ectasia, unspecified site: Secondary | ICD-10-CM

## 2024-03-13 DIAGNOSIS — I5020 Unspecified systolic (congestive) heart failure: Secondary | ICD-10-CM | POA: Diagnosis not present

## 2024-03-13 DIAGNOSIS — I351 Nonrheumatic aortic (valve) insufficiency: Secondary | ICD-10-CM | POA: Diagnosis not present

## 2024-03-13 MED ORDER — METOPROLOL SUCCINATE ER 50 MG PO TB24: 50.0000 mg | ORAL_TABLET | Freq: Every day | ORAL | 3 refills | Status: DC

## 2024-03-13 MED ORDER — LOSARTAN POTASSIUM 50 MG PO TABS: 50.0000 mg | ORAL_TABLET | Freq: Every day | ORAL | 3 refills | Status: DC

## 2024-03-13 NOTE — Patient Instructions (Signed)
 Medication Instructions:  No changes *If you need a refill on your cardiac medications before your next appointment, please call your pharmacy*  Lab Work: Today we are going to have you get a Bmet If you have labs (blood work) drawn today and your tests are completely normal, you will receive your results only by: MyChart Message (if you have MyChart) OR A paper copy in the mail If you have any lab test that is abnormal or we need to change your treatment, we will call you to review the results.  Testing/Procedures: Jose Hood- Long Term Monitor Instructions  Your physician has requested you wear a ZIO patch monitor for 14 days.  This is a single patch monitor. [employer] supplies one patch monitor per enrollment. Additional stickers are not available. Please do not apply patch if you will be having a Nuclear Stress Test,  Echocardiogram, Cardiac CT, MRI, or Chest Xray during the period you would be wearing the  monitor. The patch cannot be worn during these tests. You cannot remove and re-apply the  ZIO XT patch monitor.  Your ZIO patch monitor will be mailed 3 day USPS to your address on file. It may take 3-5 days  to receive your monitor after you have been enrolled.  Once you have received your monitor, please review the enclosed instructions. Your monitor  has already been registered assigning a specific monitor serial # to you.  Billing and Patient Assistance Program Information  We have supplied [employer] with any of your insurance information on file for billing purposes. [employer] offers a sliding scale Patient Assistance Program for patients that do not have  insurance, or whose insurance does not completely cover the cost of the ZIO monitor.  You must apply for the Patient Assistance Program to qualify for this discounted rate.  To apply, please call [employer] at [phone], select option 4, select option 2, ask to apply for  Patient Assistance Program. Jose Hood will ask your household  income, and how many people  are in your household. They will quote your out-of-pocket cost based on that information.  [employer] will also be able to set up a 57-month, interest-free payment plan if needed.  Applying the monitor   Shave hair from upper left chest.  Hold abrader disc by orange tab. Rub abrader in 40 strokes over the upper left chest as  indicated in your monitor instructions.  Clean area with 4 enclosed alcohol pads. Let dry.  Apply patch as indicated in monitor instructions. Patch will be placed under collarbone on left  side of chest with arrow pointing upward.  Rub patch adhesive wings for 2 minutes. Remove white label marked "1". Remove the white  label marked "2". Rub patch adhesive wings for 2 additional minutes.  While looking in a mirror, press and release button in center of patch. A small green light will  flash 3-4 times. This will be your only indicator that the monitor has been turned on.  Do not shower for the first 24 hours. You may shower after the first 24 hours.  Press the button if you feel a symptom. You will hear a small click. Record Date, Time and  Symptom in the Patient Logbook.  When you are ready to remove the patch, follow instructions on the last 2 pages of Patient  Logbook. Stick patch monitor onto the last page of Patient Logbook.  Place Patient Logbook in the [place] and white box. Use locking tab on box and tape box closed  securely. The [place] and white box has prepaid postage on it. Please place it in the mailbox as  soon as possible. Your physician should have your test results approximately 7 days after the  monitor has been mailed back to [facility].  Call [facility] Customer Care at [phone] if you have questions regarding  your ZIO XT patch monitor. Call them immediately if you see an orange light blinking on your  monitor.  If your monitor falls off in less than 4 days, contact our Monitor department at [phone].  If your  monitor becomes loose or falls off after 4 days call [employer] at [phone] for  suggestions on securing your monitor   Follow-Up: At [facility], you and your health needs are our priority.  As part of our continuing mission to provide you with exceptional heart care, our providers are all part of one team.  This team includes your primary Cardiologist (physician) and Advanced Practice Providers or APPs (Physician Assistants and Nurse Practitioners) who all work together to provide you with the care you need, when you need it.  Your next appointment:   8 week(s)  Provider:   Katlyn West, NP        We recommend signing up for the patient portal called "MyChart".  Sign up information is provided on this After Visit Summary.  MyChart is used to connect with patients for Virtual Visits (Telemedicine).  Patients are able to view lab/test results, encounter notes, upcoming appointments, etc.  Non-urgent messages can be sent to your provider as well.   To learn more about what you can do with MyChart, go to [email or web address].

## 2024-03-13 NOTE — Progress Notes (Unsigned)
 Enrolled for [employer] to mail a ZIO XT long term holter monitor to the patients address on file.   Dr. Flora Lipps to read.

## 2024-03-14 ENCOUNTER — Ambulatory Visit: Payer: Self-pay | Admitting: Cardiology

## 2024-03-14 DIAGNOSIS — Z79899 Other long term (current) drug therapy: Secondary | ICD-10-CM

## 2024-03-14 DIAGNOSIS — I4819 Other persistent atrial fibrillation: Secondary | ICD-10-CM

## 2024-03-14 LAB — BASIC METABOLIC PANEL WITH GFR
BUN/Creatinine Ratio: 13 (ref 10–24)
BUN: 18 mg/dL (ref 8–27)
CO2: 23 mmol/L (ref 20–29)
Calcium: 9.6 mg/dL (ref 8.6–10.2)
Chloride: 103 mmol/L (ref 96–106)
Creatinine, Ser: 1.38 mg/dL — ABNORMAL HIGH (ref 0.76–1.27)
Glucose: 79 mg/dL (ref 70–99)
Potassium: 4.9 mmol/L (ref 3.5–5.2)
Sodium: 145 mmol/L — ABNORMAL HIGH (ref 134–144)
eGFR: 51 mL/min/{1.73_m2} — ABNORMAL LOW (ref >59)

## 2024-03-14 NOTE — Telephone Encounter (Signed)
 Called patient advised of below they verbalized understanding Ordered labs

## 2024-03-14 NOTE — Telephone Encounter (Signed)
-----   Message from Katlyn D [place] sent at 03/14/2024  8:21 AM EDT ----- Please let Jose Hood know that his labwork shows a slight reduction in kidney function, his electrolytes are overall normal, his sodium level is slightly elevated. Recommend he stay hydrated with water and repeat a BMET in 10 days for monitoring of kidney function.

## 2024-03-15 DIAGNOSIS — M1990 Unspecified osteoarthritis, unspecified site: Secondary | ICD-10-CM | POA: Diagnosis not present

## 2024-03-15 DIAGNOSIS — I10 Essential (primary) hypertension: Secondary | ICD-10-CM | POA: Diagnosis not present

## 2024-03-15 DIAGNOSIS — E7800 Pure hypercholesterolemia, unspecified: Secondary | ICD-10-CM | POA: Diagnosis not present

## 2024-03-15 DIAGNOSIS — Z7901 Long term (current) use of anticoagulants: Secondary | ICD-10-CM | POA: Diagnosis not present

## 2024-03-15 DIAGNOSIS — Z9049 Acquired absence of other specified parts of digestive tract: Secondary | ICD-10-CM | POA: Diagnosis not present

## 2024-03-15 DIAGNOSIS — I480 Paroxysmal atrial fibrillation: Secondary | ICD-10-CM | POA: Diagnosis not present

## 2024-03-15 DIAGNOSIS — Z860100 Personal history of colon polyps, unspecified: Secondary | ICD-10-CM | POA: Diagnosis not present

## 2024-03-15 DIAGNOSIS — M109 Gout, unspecified: Secondary | ICD-10-CM | POA: Diagnosis not present

## 2024-03-15 DIAGNOSIS — K810 Acute cholecystitis: Secondary | ICD-10-CM | POA: Diagnosis not present

## 2024-03-15 DIAGNOSIS — Z556 Problems related to health literacy: Secondary | ICD-10-CM | POA: Diagnosis not present

## 2024-03-15 DIAGNOSIS — Z48815 Encounter for surgical aftercare following surgery on the digestive system: Secondary | ICD-10-CM | POA: Diagnosis not present

## 2024-03-20 DIAGNOSIS — Z7901 Long term (current) use of anticoagulants: Secondary | ICD-10-CM | POA: Diagnosis not present

## 2024-03-20 DIAGNOSIS — I10 Essential (primary) hypertension: Secondary | ICD-10-CM | POA: Diagnosis not present

## 2024-03-20 DIAGNOSIS — I480 Paroxysmal atrial fibrillation: Secondary | ICD-10-CM | POA: Diagnosis not present

## 2024-03-20 DIAGNOSIS — M109 Gout, unspecified: Secondary | ICD-10-CM | POA: Diagnosis not present

## 2024-03-20 DIAGNOSIS — Z9049 Acquired absence of other specified parts of digestive tract: Secondary | ICD-10-CM | POA: Diagnosis not present

## 2024-03-20 DIAGNOSIS — M1990 Unspecified osteoarthritis, unspecified site: Secondary | ICD-10-CM | POA: Diagnosis not present

## 2024-03-20 DIAGNOSIS — K810 Acute cholecystitis: Secondary | ICD-10-CM | POA: Diagnosis not present

## 2024-03-20 DIAGNOSIS — Z860100 Personal history of colon polyps, unspecified: Secondary | ICD-10-CM | POA: Diagnosis not present

## 2024-03-20 DIAGNOSIS — Z48815 Encounter for surgical aftercare following surgery on the digestive system: Secondary | ICD-10-CM | POA: Diagnosis not present

## 2024-03-20 DIAGNOSIS — E7800 Pure hypercholesterolemia, unspecified: Secondary | ICD-10-CM | POA: Diagnosis not present

## 2024-03-20 DIAGNOSIS — Z556 Problems related to health literacy: Secondary | ICD-10-CM | POA: Diagnosis not present

## 2024-03-21 ENCOUNTER — Other Ambulatory Visit: Payer: Self-pay

## 2024-03-21 DIAGNOSIS — Z79899 Other long term (current) drug therapy: Secondary | ICD-10-CM | POA: Diagnosis not present

## 2024-03-21 DIAGNOSIS — I4819 Other persistent atrial fibrillation: Secondary | ICD-10-CM | POA: Diagnosis not present

## 2024-03-22 ENCOUNTER — Ambulatory Visit: Payer: Self-pay | Admitting: Cardiology

## 2024-03-22 LAB — BASIC METABOLIC PANEL WITH GFR
BUN/Creatinine Ratio: 16 (ref 10–24)
BUN: 17 mg/dL (ref 8–27)
CO2: 22 mmol/L (ref 20–29)
Calcium: 9.9 mg/dL (ref 8.6–10.2)
Chloride: 103 mmol/L (ref 96–106)
Creatinine, Ser: 1.06 mg/dL (ref 0.76–1.27)
Glucose: 64 mg/dL — ABNORMAL LOW (ref 70–99)
Potassium: 4.5 mmol/L (ref 3.5–5.2)
Sodium: 141 mmol/L (ref 134–144)
eGFR: 70 mL/min/{1.73_m2} (ref >59)

## 2024-03-26 DIAGNOSIS — Z23 Encounter for immunization: Secondary | ICD-10-CM | POA: Diagnosis not present

## 2024-03-26 DIAGNOSIS — I77819 Aortic ectasia, unspecified site: Secondary | ICD-10-CM | POA: Diagnosis not present

## 2024-03-26 DIAGNOSIS — Z0000 Encounter for general adult medical examination without abnormal findings: Secondary | ICD-10-CM | POA: Diagnosis not present

## 2024-03-26 DIAGNOSIS — E785 Hyperlipidemia, unspecified: Secondary | ICD-10-CM | POA: Diagnosis not present

## 2024-03-26 DIAGNOSIS — I10 Essential (primary) hypertension: Secondary | ICD-10-CM | POA: Diagnosis not present

## 2024-03-26 DIAGNOSIS — K5900 Constipation, unspecified: Secondary | ICD-10-CM | POA: Diagnosis not present

## 2024-03-26 NOTE — Telephone Encounter (Signed)
-----   Message from Katlyn D [place] sent at 03/22/2024  4:22 PM EDT ----- Please let Jose Hood know that his kidney function has improved and his electrolytes are normal. His blood sugar was low at time of check, please remind him to eat regularly. Overall good results, continue current medications and follow up as planned.

## 2024-03-26 NOTE — Telephone Encounter (Signed)
 Called patients wife advised of below they verbalized understanding, will pass along message

## 2024-04-09 DIAGNOSIS — I4819 Other persistent atrial fibrillation: Secondary | ICD-10-CM | POA: Diagnosis not present

## 2024-04-15 DIAGNOSIS — E785 Hyperlipidemia, unspecified: Secondary | ICD-10-CM | POA: Diagnosis not present

## 2024-04-15 DIAGNOSIS — I10 Essential (primary) hypertension: Secondary | ICD-10-CM | POA: Diagnosis not present

## 2024-04-21 DIAGNOSIS — I4819 Other persistent atrial fibrillation: Secondary | ICD-10-CM

## 2024-04-24 NOTE — Telephone Encounter (Signed)
 Left message to call back

## 2024-04-24 NOTE — Telephone Encounter (Signed)
-----   Message from Katlyn D [place] sent at 04/24/2024 11:57 AM EDT ----- Please let Mr. Dyment know that his cardiac monitor showed his predominant underlying rhythm was sinus rhythm with an average heart rate of 77 bpm. He had episodes of fast heart beats from the top  chambers of the heart. There was no evidence of atrial fibrillation. We will discuss further on follow up for now continue current medications and follow up as planned. ----- Message ----- From: Barbaraann Darryle Ned, MD Sent: 04/21/2024   6:10 PM EDT To: Katlyn D West, NP

## 2024-04-25 NOTE — Telephone Encounter (Signed)
 Called patient advised of below they verbalized understanding.

## 2024-04-25 NOTE — Telephone Encounter (Signed)
-----   Message from Jose Hood sent at 04/24/2024 11:57 AM EDT ----- Please let [name] know that his cardiac monitor showed his predominant underlying rhythm was sinus rhythm with an average heart rate of 77 bpm. He had episodes of fast heart beats from the top  chambers of the heart. There was no evidence of atrial fibrillation. We will discuss further on follow up for now continue current medications and follow up as planned. ----- Message ----- From: Jose Darryle Ned, MD Sent: 04/21/2024   6:10 PM EDT To: Jose D West, NP

## 2024-05-12 NOTE — Progress Notes (Unsigned)
 Cardiology Office Note    Date:  05/13/2024  ID:  Zai Chmiel, DOB 1941-06-06, MRN [id number] PCP:  Kip Righter, MD  Cardiologist:  Darryle ONEIDA Decent, MD  Electrophysiologist:  None   Chief Complaint: Follow up for atrial fibrillation   History of Present Illness: .    Jose Hood is a 83 y.o. male with visit-pertinent history of hypertension, aortic dilation, AI, atrial fibrillation.  Patient was last seen in clinic on/07/2024 by Dr. Decent.  Patient was noted to be in new atrial fibrillation, he was asymptomatic.  Given lack of symptoms it was elected that he continue with rate control strategy.  He was started on Eliquis  5 mg twice daily and metoprolol  tartrate 25 mg twice daily.  On 02/12/2024 patient presented to the emergency department with acute cholecystitis was also found to be in atrial fibrillation with mild RVR.  He was initially started on diltiazem  drip.  Cardiology was consulted for preoperative cardiac evaluation.  Patient underwent laparoscopic cholecystectomy on 02/15/2024.  Following procedure there was difficulty in keeping his heart rate controlled, echocardiogram on 02/19/2024 indicated LVEF of 45 to 50%, LV with global hypokinesis, moderate LVH, diastolic parameters were indeterminate.  RV systolic function was moderately reduced, RV was moderately enlarged, mildly elevated pulmonary artery systolic pressures.  On 02/21/2024 patient underwent TEE/DCCV, he was discharged on Toprol  and Eliquis .  TEE indicated LVEF of 40 to 45%, LV with global hypokinesis, RV systolic function was mildly reduced, mildly enlarged left atrial size was mildly dilated, no left atrial/atrial appendage thrombus was detected, a small pericardial effusion was present localized near the right ventricle, there is mild mitral regurgitation with no evidence of mitral stenosis, aortic valve regurgitation was moderate to severe, no evidence of aortic valve stenosis.  At discharge patient's home amlodipine and  lisinopril  were discontinued, he was started on metoprolol  and losartan .  He was seen in clinic on 03/13/2024.  He reported he is doing well overall.  He denies any palpitations or feeling like his heart rate had been fast.  Patient reported that when he is in the hospital his heart rate would increase he would overall feel very weak.  He denied any recurrence of this following his discharge.  Since seen in clinic on 03/13/2024 patient remained in sinus rhythm, was agreeable to 2-week cardiac monitor to reassess A-fib burden.  Cardiac monitor indicated average heart rate of 77 bpm, ranging from 44 bpm to 231 bpm.  Predominant underlying rhythm was sinus rhythm.  He had 172 supraventricular tachycardia runs, run with the fastest interval lasting 5 beats with a max rate of 231, longest lasting 21.4 seconds with an average rate of 117 bpm.  Noted to have rare ectopy.  Today he presents for follow-up.  He reports that he has been doing well. He denies chest pain or shortness of breath, lower extremity edema, orthopnea or PND.  Patient denies any palpitations, presyncope or syncope.  Patient does report that his blood pressure has been elevated in recent weeks, denies any vision changes or headaches.  He notes occasional elevations in his heart rate up to 110 bpm however reports this quickly drops back down into the 80s.  Patient reports that he went to the beach last week, enjoyed his time however has a rash on his neck and his eyes have been burning, he plans to follow-up with his PCP regarding this. ROS: .   Today he denies chest pain, shortness of breath, lower extremity edema, fatigue, palpitations, melena, hematuria,  hemoptysis, diaphoresis, weakness, presyncope, syncope, orthopnea, and PND.  All other systems are reviewed and otherwise negative. Studies Reviewed: SABRA   EKG:  EKG is not ordered today.  CV Studies: Cardiac studies reviewed are outlined and summarized above. Otherwise please see EMR for full  report. Cardiac Studies & Procedures   ______________________________________________________________________________________________     ECHOCARDIOGRAM  ECHOCARDIOGRAM COMPLETE 02/19/2024  Narrative ECHOCARDIOGRAM REPORT    Patient Name:   Jose Hood Date of Exam: 02/19/2024 Medical Rec #:  [id number]  Height:       73.0 in Accession #:    [id number] Weight:       190.0 lb Date of Birth:  27-Mar-1941   BSA:          2.105 m Patient Age:    82 years   BP:           121/84 mmHg Patient Gender: M          HR:           93 bpm. Exam Location:  Inpatient  Procedure: 2D Echo, Cardiac Doppler and Color Doppler (Both Spectral and Color Flow Doppler were utilized during procedure).  Indications:    I48.91* Unspeicified atrial fibrillation  History:        Patient has prior history of Echocardiogram examinations, most recent 11/11/2022. Abnormal ECG, Aortic Valve Disease, Arrythmias:Atrial Fibrillation; Risk Factors:Hypertension and Dyslipidemia.  Sonographer:    Ellouise Mose RDCS Referring Phys: [id number] ALINE FORBES DOOR   Sonographer Comments: Technically difficult study due to poor echo windows. Image acquisition challenging due to respiratory motion. IMPRESSIONS   1. Left ventricular ejection fraction, by estimation, is 45 to 50%. The left ventricle has mildly decreased function. The left ventricle demonstrates global hypokinesis. There is moderate left ventricular hypertrophy. Left ventricular diastolic parameters are indeterminate. 2. Right ventricular systolic function is moderately reduced. The right ventricular size is moderately enlarged. Mildly increased right ventricular wall thickness. There is mildly elevated pulmonary artery systolic pressure. 3. The mitral valve is grossly normal. Mild mitral valve regurgitation. No evidence of mitral stenosis. 4. Tricuspid valve regurgitation is mild to moderate. 5. The aortic valve is calcified. There is severe calcifcation of the aortic  valve. Aortic valve regurgitation is mild. Aortic valve sclerosis is present, with no evidence of aortic valve stenosis. 6. Aortic dilatation noted. There is moderate dilatation of the ascending aorta, measuring 45 mm. There is mild dilatation of the aortic root, measuring 40 mm. 7. The inferior vena cava is dilated in size with <50% respiratory variability, suggesting right atrial pressure of 15 mmHg.  Comparison(s): Changes from prior study are noted. Now in afib, EF mildly reduced.  FINDINGS Left Ventricle: Left ventricular ejection fraction, by estimation, is 45 to 50%. The left ventricle has mildly decreased function. The left ventricle demonstrates global hypokinesis. The left ventricular internal cavity size was normal in size. There is moderate left ventricular hypertrophy. Left ventricular diastolic function could not be evaluated due to atrial fibrillation. Left ventricular diastolic parameters are indeterminate.  Right Ventricle: The right ventricular size is moderately enlarged. Mildly increased right ventricular wall thickness. Right ventricular systolic function is moderately reduced. There is mildly elevated pulmonary artery systolic pressure. The tricuspid regurgitant velocity is 2.34 m/s, and with an assumed right atrial pressure of 15 mmHg, the estimated right ventricular systolic pressure is 36.9 mmHg.  Left Atrium: Left atrial size was normal in size.  Right Atrium: Right atrial size was normal in size.  Pericardium: There is no evidence  of pericardial effusion.  Mitral Valve: The mitral valve is grossly normal. Mild mitral valve regurgitation. No evidence of mitral valve stenosis.  Tricuspid Valve: The tricuspid valve is normal in structure. Tricuspid valve regurgitation is mild to moderate. No evidence of tricuspid stenosis.  Aortic Valve: The aortic valve is calcified. There is severe calcifcation of the aortic valve. Aortic valve regurgitation is mild. Aortic  regurgitation PHT measures 1632 msec. Aortic valve sclerosis is present, with no evidence of aortic valve stenosis. Aortic valve mean gradient measures 6.0 mmHg. Aortic valve peak gradient measures 11.8 mmHg. Aortic valve area, by VTI measures 2.63 cm.  Pulmonic Valve: The pulmonic valve was normal in structure. Pulmonic valve regurgitation is trivial. No evidence of pulmonic stenosis.  Aorta: Aortic dilatation noted. There is moderate dilatation of the ascending aorta, measuring 45 mm. There is mild dilatation of the aortic root, measuring 40 mm.  Venous: The inferior vena cava is dilated in size with less than 50% respiratory variability, suggesting right atrial pressure of 15 mmHg.  IAS/Shunts: No atrial level shunt detected by color flow Doppler.   LEFT VENTRICLE PLAX 2D LVIDd:         3.60 cm LVIDs:         2.70 cm LV PW:         1.40 cm LV IVS:        1.40 cm LVOT diam:     2.40 cm LV SV:         63 LV SV Index:   30 LVOT Area:     4.52 cm  LV Volumes (MOD) LV vol d, MOD A2C: 49.3 ml LV vol d, MOD A4C: 66.1 ml LV vol s, MOD A2C: 27.2 ml LV vol s, MOD A4C: 35.6 ml LV SV MOD A2C:     22.1 ml LV SV MOD A4C:     66.1 ml LV SV MOD BP:      24.2 ml  RIGHT VENTRICLE             IVC RV S prime:     11.70 cm/s  IVC diam: 2.10 cm TAPSE (M-mode): 1.2 cm  LEFT ATRIUM             Index        RIGHT ATRIUM           Index LA diam:        3.20 cm 1.52 cm/m   RA Area:     18.20 cm LA Vol (A2C):   28.0 ml 13.30 ml/m  RA Volume:   49.00 ml  23.28 ml/m LA Vol (A4C):   46.0 ml 21.85 ml/m LA Biplane Vol: 37.1 ml 17.62 ml/m AORTIC VALVE AV Area (Vmax):    2.26 cm AV Area (Vmean):   2.25 cm AV Area (VTI):     2.63 cm AV Vmax:           171.50 cm/s AV Vmean:          112.700 cm/s AV VTI:            0.241 m AV Peak Grad:      11.8 mmHg AV Mean Grad:      6.0 mmHg LVOT Vmax:         85.57 cm/s LVOT Vmean:        55.967 cm/s LVOT VTI:          0.140 m LVOT/AV VTI ratio:  0.58 AI PHT:  1632 msec  AORTA Ao Root diam: 4.00 cm Ao Asc diam:  4.50 cm  MITRAL VALVE               TRICUSPID VALVE MV Area (PHT): 4.19 cm    TR Peak grad:   21.9 mmHg MV Decel Time: 181 msec    TR Vmax:        234.00 cm/s MV E velocity: 91.25 cm/s SHUNTS Systemic VTI:  0.14 m Systemic Diam: 2.40 cm  Shelda Bruckner MD Electronically signed by Shelda Bruckner MD Signature Date/Time: 02/19/2024/7:11:05 PM    Final   TEE  ECHO TEE 02/21/2024  Narrative TRANSESOPHOGEAL ECHO REPORT    Patient Name:   Jose Hood Date of Exam: 02/21/2024 Medical Rec #:  [id number]  Height:       73.0 in Accession #:    [id number] Weight:       190.0 lb Date of Birth:  1941/06/30   BSA:          2.105 m Patient Age:    82 years   BP:           127/79 mmHg Patient Gender: M          HR:           85 bpm. Exam Location:  Inpatient  Procedure: Transesophageal Echo, Cardiac Doppler and Color Doppler (Both Spectral and Color Flow Doppler were utilized during procedure).  Indications:     I48.91* Unspeicified atrial fibrillation  History:         Patient has prior history of Echocardiogram examinations, most recent 02/19/2024. Abnormal ECG, Arrythmias:Atrial Fibrillation; Risk Factors:Hypertension and Dyslipidemia.  Sonographer:     Ellouise Mose RDCS Referring Phys:  [id number] ALINE BRAVO GOODRICH Diagnosing Phys: Madonna Large  PROCEDURE: After discussion of the risks and benefits of a TEE, an informed consent was obtained from the patient. TEE procedure time was 28 minutes. The transesophogeal probe was passed without difficulty through the esophogus of the patient. Imaged were obtained with the patient in a supine position. Sedation performed by different physician. The patient was monitored while under deep sedation. Anesthestetic sedation was provided intravenously by Anesthesiology: 429mg  of Propofol . Image quality was good. The patient's vital signs; including heart rate, blood  pressure, and oxygen  saturation; remained stable throughout the procedure. Supplementary images were obtained from transthoracic windows as indicated to answer the clinical question. The patient developed no complications during the procedure. A successful direct current cardioversion was performed at 200 joules with 1 attempt.  IMPRESSIONS   1. Left ventricular ejection fraction, by estimation, is 40 to 45%. The left ventricle has mildly decreased function. The left ventricle demonstrates global hypokinesis. 2. Right ventricular systolic function is mildly reduced. The right ventricular size is mildly enlarged. Mildly increased right ventricular wall thickness. 3. Left atrial size was mildly dilated. No left atrial/left atrial appendage thrombus was detected. The LAA emptying velocity was 18 cm/s. 4. A small pericardial effusion is present. The pericardial effusion is localized near the right ventricle. 5. The mitral valve is normal in structure. Mild mitral valve regurgitation. No evidence of mitral stenosis. 6. The aortic valve is tricuspid. Aortic valve regurgitation is moderate to severe. Aortic valve sclerosis is present, with no evidence of aortic valve stenosis. 7. Aortic dilatation noted. There is dilatation of the ascending aorta, measuring 44 mm. There is dilatation of the aortic root, measuring 44 mm. There is mild (Grade II) layered plaque involving the descending aorta and aortic arch. 8.  Agitated saline contrast bubble study was negative, with no evidence of any interatrial shunt.  Conclusion(s)/Recommendation(s): No LA/LAA thrombus identified. Successful cardioversion performed with restoration of normal sinus rhythm.  FINDINGS Left Ventricle: Left ventricular ejection fraction, by estimation, is 40 to 45%. The left ventricle has mildly decreased function. The left ventricle demonstrates global hypokinesis. The left ventricular internal cavity size was normal in size.  Right  Ventricle: The right ventricular size is mildly enlarged. Mildly increased right ventricular wall thickness. Right ventricular systolic function is mildly reduced.  Left Atrium: Left atrial size was mildly dilated. Spontaneous echo contrast was present in the left atrium. No left atrial/left atrial appendage thrombus was detected. The LAA emptying velocity was 18 cm/s.  Right Atrium: Right atrial size was normal in size.  Pericardium: A small pericardial effusion is present. The pericardial effusion is localized near the right ventricle.  Mitral Valve: The mitral valve is normal in structure. Mild mitral valve regurgitation. No evidence of mitral valve stenosis.  Tricuspid Valve: The tricuspid valve is normal in structure. Tricuspid valve regurgitation is mild . No evidence of tricuspid stenosis.  Aortic Valve: The aortic valve is tricuspid. Aortic valve regurgitation is moderate to severe. Aortic valve sclerosis is present, with no evidence of aortic valve stenosis. Aortic valve mean gradient measures 3.0 mmHg. Aortic valve peak gradient measures 8.9 mmHg. Aortic valve area, by VTI measures 1.73 cm.  Pulmonic Valve: The pulmonic valve was normal in structure. Pulmonic valve regurgitation is trivial. No evidence of pulmonic stenosis.  Aorta: Aortic dilatation noted. There is dilatation of the ascending aorta, measuring 44 mm. There is dilatation of the aortic root, measuring 44 mm. There is mild (Grade II) layered plaque involving the descending aorta and aortic arch.  IAS/Shunts: No atrial level shunt detected by color flow Doppler. Agitated saline contrast bubble study was negative, with no evidence of any interatrial shunt.  Additional Comments: Spectral Doppler performed.  LEFT VENTRICLE PLAX 2D LVOT diam:     2.20 cm LV SV:         37 LV SV Index:   18 LVOT Area:     3.80 cm   AORTIC VALVE AV Area (Vmax):    1.99 cm AV Area (Vmean):   2.54 cm AV Area (VTI):     1.73 cm AV  Vmax:           149.00 cm/s AV Vmean:          81.200 cm/s AV VTI:            0.213 m AV Peak Grad:      8.9 mmHg AV Mean Grad:      3.0 mmHg LVOT Vmax:         78.00 cm/s LVOT Vmean:        54.300 cm/s LVOT VTI:          0.097 m LVOT/AV VTI ratio: 0.46 AR Vena Contracta: 0.52 cm  AORTA Ao Root diam: 4.40 cm Ao Asc diam:  4.40 cm   SHUNTS Systemic VTI:  0.10 m Systemic Diam: 2.20 cm  Sunit Tolia Electronically signed by Madonna Large Signature Date/Time: 02/21/2024/6:16:50 PM    Final  MONITORS  LONG TERM MONITOR (3-14 DAYS) 04/10/2024  Narrative Patch Wear Time:  13 days and 23 hours (2025-05-31T09:20:23-0400 to 2025-06-14T09:20:15-0400)  Patient had a min HR of 44 bpm (sinus bradycardia), max HR of 231 bpm (supraventricular tachycardia, 5 beats, 1.5 second duration), and avg HR of 77 bpm (normal sinus rhythm).  Predominant underlying rhythm was Sinus Rhythm. 172 Supraventricular Tachycardia runs occurred, the run with the fastest interval lasting 5 beats (1.5 second duration) with a max rate of 231 bpm, the longest lasting 21.4 secs with an avg rate of 117 bpm. Isolated SVEs were rare (<1.0%), SVE Couplets were rare (<1.0%), and SVE Triplets were rare (<1.0%). Isolated VEs were rare (<1.0%), VE Couplets were rare (<1.0%), and no VE Triplets were present. Ventricular Trigeminy was present.  Impression: Supraventricular tachycardia detected (172 episodes in 14 days; longest duration 21.4 seconds). Rare ectopy.  Darryle T. Barbaraann, MD, [facility]  [facility] [address] [place], [place] [address] [phone] 6:10 PM       ______________________________________________________________________________________________       Current Reported Medications:.    Current Meds  Medication Sig   apixaban  (ELIQUIS ) 5 MG TABS tablet Take 1 tablet (5 mg total) by mouth 2 (two) times daily.   finasteride  (PROSCAR ) 5 MG tablet TAKE 1 TABLET BY MOUTH EVERY  DAY   metoprolol  succinate (TOPROL -XL) 50 MG 24 hr tablet Take 1.5 tablets (75 mg total) by mouth daily. Take with or immediately following a meal.   probenecid  (BENEMID ) 500 MG tablet Take 1 tablet (500 mg total) by mouth daily.   sacubitril -valsartan  (ENTRESTO ) 24-26 MG Take 1 tablet by mouth 2 (two) times daily.   tamsulosin  (FLOMAX ) 0.4 MG CAPS capsule TAKE ONE CAPSULE BY MOUTH EVERY DAY   [DISCONTINUED] losartan  (COZAAR ) 50 MG tablet Take 1 tablet (50 mg total) by mouth daily.   [DISCONTINUED] metoprolol  succinate (TOPROL -XL) 50 MG 24 hr tablet Take 1 tablet (50 mg total) by mouth daily. Take with or immediately following a meal.    Physical Exam:    VS:  BP (!) 156/88   Pulse 80   Ht 6' 1 (1.854 m)   Wt 195 lb (88.5 kg)   SpO2 97%   BMI 25.73 kg/m    Wt Readings from Last 3 Encounters:  05/13/24 195 lb (88.5 kg)  03/13/24 191 lb (86.6 kg)  02/15/24 190 lb (86.2 kg)    GEN: Well nourished, well developed in no acute distress NECK: No JVD; No carotid bruits CARDIAC: RRR, no murmurs, rubs, gallops RESPIRATORY:  Clear to auscultation without rales, wheezing or rhonchi  ABDOMEN: Soft, non-tender, non-distended EXTREMITIES:  No edema; No acute deformity     Asessement and Plan:.    Persistent atrial fibrillation: Patient seen in clinic on 01/25/2024, noted to be in new atrial fibrillation, as he was asymptomatic rate control strategy was selected.  However on 02/12/2024 patient presented with acute cholecystitis and found to be in A-fib with mild RVR.  Underwent TEE/DCCV on 02/21/2024, discharged on Toprol  and Eliquis . Cardiac monitor indicated an average heart rate of 77 bpm, ranging from 44 bpm to 231 bpm.  Predominant underlying rhythm was sinus rhythm.  He had 172 supraventricular tachycardia runs, run with the fastest interval lasting 5 beats with a max rate of 231, longest lasting 21.4 seconds with an average rate of 117 bpm.  Noted to have rare ectopy.  Patient denies any  palpitations or feeling of increased heart rates however reports that sometimes when he checks his heart rate it would jump to 110 bpm then quickly return into the 80s.  Will increase patient's metoprolol  succinate to 75 mg daily.  Encouraged patient to continue monitoring at home and notify the office if persistently elevated or if he experiences any increased fatigue, dizziness or lightheadedness.  Patient denies any bleeding problems on Eliquis .  Continue Eliquis  5 mg twice daily.  Start metoprolol  succinate 75 mg daily.  HF: TEE indicated LVEF of 40 to 45%, LV with global hypokinesis, RV systolic function was mildly reduced, mildly enlarged left atrial size was mildly dilated, no left atrial/atrial appendage thrombus was detected, a small pericardial effusion was present localized near the right ventricle.  While in hospital was felt that reduction in EF was related to atrial fibrillation with RVR, recommended repeating echocardiogram in 2 to 3 months if patient maintains sinus rhythm.  Patient denies any shortness of breath, lower extremity edema, orthopnea or PND.  He does report that his blood pressure has been elevated in recent weeks with systolics in the 150s.  Patient agreeable to escalating GDMT, will stop losartan  and start Entresto  24-26 mg twice daily.  Patient encouraged to monitor his blood pressure at home and notify the office if consistently elevated above 130/80. Check BMET today, repeat in two weeks.  Check echocardiogram.  HTN: Initial blood pressure today 160/100, on recheck was 156/88.  Patient to stop losartan  and start on Entresto  as noted above.  Patient encouraged to monitor his blood pressure at home and notify the office if consistently elevated above 130/80.  Aortic insufficiency: Echo on 02/19/2024 indicated mild aortic valve regurgitation, TEE on 5/7 noted to have moderate to severe aortic valve regurgitation with no evidence of stenosis. Today he denies chest pain or increased  shortness of breath.  Will monitor on echocardiogram as noted above.  AAA: Noted to be at 4.7 cm in diameter on CTA in 01/2024.   Recommend semiannual imaging with CTA/MRI, patient has been referred to cardiothoracic surgery.  Patient will have check of CT angio chest aorta in 3 months.   Disposition: F/u with Glyn Gerads, NP in 6 weeks.   Signed, Shamica Moree D Mima Cranmore, NP

## 2024-05-13 ENCOUNTER — Encounter: Payer: Self-pay | Admitting: Cardiology

## 2024-05-13 ENCOUNTER — Ambulatory Visit: Attending: Cardiology | Admitting: Cardiology

## 2024-05-13 VITALS — BP 156/88 | HR 80 | Ht 73.0 in | Wt 195.0 lb

## 2024-05-13 DIAGNOSIS — I10 Essential (primary) hypertension: Secondary | ICD-10-CM

## 2024-05-13 DIAGNOSIS — I77819 Aortic ectasia, unspecified site: Secondary | ICD-10-CM

## 2024-05-13 DIAGNOSIS — I5020 Unspecified systolic (congestive) heart failure: Secondary | ICD-10-CM | POA: Diagnosis not present

## 2024-05-13 DIAGNOSIS — I4819 Other persistent atrial fibrillation: Secondary | ICD-10-CM | POA: Diagnosis not present

## 2024-05-13 DIAGNOSIS — I351 Nonrheumatic aortic (valve) insufficiency: Secondary | ICD-10-CM

## 2024-05-13 DIAGNOSIS — Z79899 Other long term (current) drug therapy: Secondary | ICD-10-CM | POA: Diagnosis not present

## 2024-05-13 MED ORDER — METOPROLOL SUCCINATE ER 50 MG PO TB24: 75.0000 mg | ORAL_TABLET | Freq: Every day | ORAL | 3 refills | Status: AC

## 2024-05-13 MED ORDER — SACUBITRIL-VALSARTAN 24-26 MG PO TABS: 1.0000 | ORAL_TABLET | Freq: Two times a day (BID) | ORAL | 3 refills | Status: DC

## 2024-05-13 NOTE — Patient Instructions (Addendum)
 Medication Instructions:  Stop losartan  Increase Metoprolol  succinate to 75 mg once a day  Start Entresto  24-26 mg twice a day  *If you need a refill on your cardiac medications before your next appointment, please call your pharmacy*  Lab Work: Today we are going to draw a Bmet In 2 weeks we are going to draw a Bmet If you have labs (blood work) drawn today and your tests are completely normal, you will receive your results only by: MyChart Message (if you have MyChart) OR A paper copy in the mail If you have any lab test that is abnormal or we need to change your treatment, we will call you to review the results.  Testing/Procedures: In 2 month please scheduled the Echocardiogram. Your physician has requested that you have an echocardiogram. Echocardiography is a painless test that uses sound waves to create images of your heart. It provides your doctor with information about the size and shape of your heart and how well your heart's chambers and valves are working. This procedure takes approximately one hour. There are no restrictions for this procedure. Please do NOT wear cologne, perfume, aftershave, or lotions (deodorant is allowed). Please arrive 15 minutes prior to your appointment time.  Please note: We ask at that you not bring children with you during ultrasound (echo/ vascular) testing. Due to room size and safety concerns, children are not allowed in the ultrasound rooms during exams. Our front office staff cannot provide observation of children in our lobby area while testing is being conducted. An adult accompanying a patient to their appointment will only be allowed in the ultrasound room at the discretion of the ultrasound technician under special circumstances. We apologize for any inconvenience.  We are also going to need you to scheduled a CT chest Aorta in 3 months  Follow-Up: At [facility], you and your health needs are our priority.  As part of our  continuing mission to provide you with exceptional heart care, our providers are all part of one team.  This team includes your primary Cardiologist (physician) and Advanced Practice Providers or APPs (Physician Assistants and Nurse Practitioners) who all work together to provide you with the care you need, when you need it.  Your next appointment:   6 week(s)  Provider:   Katlyn West, NP  We recommend signing up for the patient portal called MyChart.  Sign up information is provided on this After Visit Summary.  MyChart is used to connect with patients for Virtual Visits (Telemedicine).  Patients are able to view lab/test results, encounter notes, upcoming appointments, etc.  Non-urgent messages can be sent to your provider as well.   To learn more about what you can do with MyChart, go to [email or web address].

## 2024-05-14 ENCOUNTER — Ambulatory Visit: Payer: Self-pay | Admitting: Cardiology

## 2024-05-14 DIAGNOSIS — H01134 Eczematous dermatitis of left upper eyelid: Secondary | ICD-10-CM | POA: Diagnosis not present

## 2024-05-14 DIAGNOSIS — H40013 Open angle with borderline findings, low risk, bilateral: Secondary | ICD-10-CM | POA: Diagnosis not present

## 2024-05-14 DIAGNOSIS — H01131 Eczematous dermatitis of right upper eyelid: Secondary | ICD-10-CM | POA: Diagnosis not present

## 2024-05-14 DIAGNOSIS — Z961 Presence of intraocular lens: Secondary | ICD-10-CM | POA: Diagnosis not present

## 2024-05-14 LAB — BASIC METABOLIC PANEL WITH GFR
BUN/Creatinine Ratio: 16 (ref 10–24)
BUN: 18 mg/dL (ref 8–27)
CO2: 22 mmol/L (ref 20–29)
Calcium: 9.6 mg/dL (ref 8.6–10.2)
Chloride: 101 mmol/L (ref 96–106)
Creatinine, Ser: 1.16 mg/dL (ref 0.76–1.27)
Glucose: 72 mg/dL (ref 70–99)
Potassium: 4.6 mmol/L (ref 3.5–5.2)
Sodium: 140 mmol/L (ref 134–144)
eGFR: 62 mL/min/1.73 (ref >59)

## 2024-05-16 DIAGNOSIS — I10 Essential (primary) hypertension: Secondary | ICD-10-CM | POA: Diagnosis not present

## 2024-05-16 DIAGNOSIS — E785 Hyperlipidemia, unspecified: Secondary | ICD-10-CM | POA: Diagnosis not present

## 2024-05-27 DIAGNOSIS — H01134 Eczematous dermatitis of left upper eyelid: Secondary | ICD-10-CM | POA: Diagnosis not present

## 2024-05-27 DIAGNOSIS — Z961 Presence of intraocular lens: Secondary | ICD-10-CM | POA: Diagnosis not present

## 2024-05-27 DIAGNOSIS — H01131 Eczematous dermatitis of right upper eyelid: Secondary | ICD-10-CM | POA: Diagnosis not present

## 2024-05-27 DIAGNOSIS — Z79899 Other long term (current) drug therapy: Secondary | ICD-10-CM | POA: Diagnosis not present

## 2024-05-28 LAB — BASIC METABOLIC PANEL WITH GFR
BUN/Creatinine Ratio: 14 (ref 10–24)
BUN: 17 mg/dL (ref 8–27)
CO2: 22 mmol/L (ref 20–29)
Calcium: 9.2 mg/dL (ref 8.6–10.2)
Chloride: 102 mmol/L (ref 96–106)
Creatinine, Ser: 1.18 mg/dL (ref 0.76–1.27)
Glucose: 88 mg/dL (ref 70–99)
Potassium: 4.2 mmol/L (ref 3.5–5.2)
Sodium: 139 mmol/L (ref 134–144)
eGFR: 61 mL/min/1.73 (ref >59)

## 2024-06-05 DIAGNOSIS — N210 Calculus in bladder: Secondary | ICD-10-CM | POA: Diagnosis not present

## 2024-06-16 DIAGNOSIS — I10 Essential (primary) hypertension: Secondary | ICD-10-CM | POA: Diagnosis not present

## 2024-06-16 DIAGNOSIS — E785 Hyperlipidemia, unspecified: Secondary | ICD-10-CM | POA: Diagnosis not present

## 2024-06-19 ENCOUNTER — Ambulatory Visit (HOSPITAL_COMMUNITY)
Admission: RE | Admit: 2024-06-19 | Discharge: 2024-06-19 | Disposition: A | Discharge status: Home / Self Care | Source: Ambulatory Visit | Attending: Internal Medicine | Admitting: Internal Medicine

## 2024-06-19 DIAGNOSIS — I4819 Other persistent atrial fibrillation: Secondary | ICD-10-CM | POA: Insufficient documentation

## 2024-06-19 DIAGNOSIS — I5020 Unspecified systolic (congestive) heart failure: Secondary | ICD-10-CM | POA: Insufficient documentation

## 2024-06-19 LAB — ECHOCARDIOGRAM COMPLETE
AR max vel: 2.83 cm2
AV Area VTI: 2.3 cm2
AV Area mean vel: 2.44 cm2
AV Mean grad: 5 mmHg
AV Peak grad: 7.8 mmHg
Ao pk vel: 1.4 m/s
Area-P 1/2: 2.45 cm2
Calc EF: 65 %
P 1/2 time: 474 ms
S' Lateral: 2.5 cm
Single Plane A2C EF: 69.8 %
Single Plane A4C EF: 65.2 %

## 2024-06-23 NOTE — Progress Notes (Unsigned)
 Cardiology Office Note    Date:  06/24/2024  ID:  Jose Hood, DOB 1941-07-23, MRN [id number] PCP:  Kip Righter, MD  Cardiologist:  Darryle ONEIDA Decent, MD  Electrophysiologist:  None   Chief Complaint: Follow up for HFrEF and afib   History of Present Illness: .    Jose Hood is a 83 y.o. male with visit-pertinent history of hypertension, aortic dilation, AI, atrial fibrillation.  Patient was last seen in clinic on/07/2024 by Dr. Decent.  Patient was noted to be in new atrial fibrillation, he was asymptomatic.  Given lack of symptoms it was elected that he continue with rate control strategy.  He was started on Eliquis  5 mg twice daily and metoprolol  tartrate 25 mg twice daily.  On 02/12/2024 patient presented to the emergency department with acute cholecystitis was also found to be in atrial fibrillation with mild RVR.  He was initially started on diltiazem  drip.  Cardiology was consulted for preoperative cardiac evaluation.  Patient underwent laparoscopic cholecystectomy on 02/15/2024.  Following procedure there was difficulty in keeping his heart rate controlled, echocardiogram on 02/19/2024 indicated LVEF of 45 to 50%, LV with global hypokinesis, moderate LVH, diastolic parameters were indeterminate.  RV systolic function was moderately reduced, RV was moderately enlarged, mildly elevated pulmonary artery systolic pressures.  On 02/21/2024 patient underwent TEE/DCCV, he was discharged on Toprol  and Eliquis .  TEE indicated LVEF of 40 to 45%, LV with global hypokinesis, RV systolic function was mildly reduced, mildly enlarged left atrial size was mildly dilated, no left atrial/atrial appendage thrombus was detected, a small pericardial effusion was present localized near the right ventricle, there is mild mitral regurgitation with no evidence of mitral stenosis, aortic valve regurgitation was moderate to severe, no evidence of aortic valve stenosis.  At discharge patient's home amlodipine and lisinopril  were  discontinued, he was started on metoprolol  and losartan .  He was seen in clinic on 03/13/2024.  He reported he is doing well overall.  He denies any palpitations or feeling like his heart rate had been fast.  Patient reported that when he is in the hospital his heart rate would increase he would overall feel very weak.  He denied any recurrence of this following his discharge.  Since seen in clinic on 03/13/2024 patient remained in sinus rhythm, was agreeable to 2-week cardiac monitor to reassess A-fib burden.  Cardiac monitor indicated average heart rate of 77 bpm, ranging from 44 bpm to 231 bpm.  Predominant underlying rhythm was sinus rhythm.  He had 172 supraventricular tachycardia runs, run with the fastest interval lasting 5 beats with a max rate of 231, longest lasting 21.4 seconds with an average rate of 117 bpm.  Noted to have rare ectopy.  Patient was last seen in clinic on 05/13/2024 for follow-up.  He reported that he been doing very well and had remained stable from cardiac standpoint.  Patient noted occasional increases in his heart rate 210 bpm then quickly returned to 80 bpm.  Patient's metoprolol  succinate was increased to 75 mg daily.  Patient was agreeable to escalating GDMT, losartan  was discontinued and he was started on Entresto  24-26 mg twice daily.  Echocardiogram on 06/19/2024 indicated LVEF 55 to 60%, no RWMA, mild concentric LVH, G1 DD, RV systolic function and size was normal, no evidence of mitral valve regurgitation.  There was moderate thickening of the aortic valve without evidence of stenosis, aortic valve regurgitation was moderate.  There is moderate dilation of the aortic root measuring 45 mm, moderate  dilation of the ascending aorta measuring 43 mm.  Today he presents for follow-up.  He reports that he has been doing very well overall.  He denies any chest pain, shortness of breath, lower extremity edema, orthopnea or PND.  He denies any palpitations, presyncope or syncope.   He reports that his blood pressures have improved at home with systolic averaging 140.  He denies any dizziness, lightheadedness, presyncope or syncope.  He reports that he is tolerating all of his medications well. ROS: .   Today he denies chest pain, shortness of breath, lower extremity edema, fatigue, palpitations, melena, hematuria, hemoptysis, diaphoresis, weakness, presyncope, syncope, orthopnea, and PND.  All other systems are reviewed and otherwise negative. Studies Reviewed: SABRA   EKG:  EKG is not ordered today.  CV Studies: Cardiac studies reviewed are outlined and summarized above. Otherwise please see EMR for full report. Cardiac Studies & Procedures   ______________________________________________________________________________________________     ECHOCARDIOGRAM  ECHOCARDIOGRAM COMPLETE 06/19/2024  Narrative ECHOCARDIOGRAM REPORT    Patient Name:   Jose Hood    Date of Exam: 06/19/2024 Medical Rec #:  [id number]     Height:       73.0 in Accession #:    [id number]    Weight:       195.0 lb Date of Birth:  1941-08-15      BSA:          2.129 m Patient Age:    83 years      BP:           162/99 mmHg Patient Gender: M             HR:           60 bpm. Exam Location:  [address]  Procedure: 2D Echo, Cardiac Doppler, Color Doppler and Strain Analysis (Both Spectral and Color Flow Doppler were utilized during procedure).  Indications:    Persistent A-Fib I48.9  History:        Patient has prior history of Echocardiogram examinations. HF with reduced ejection fraction; Risk Factors:Hypertension and Dyslipidemia.  Sonographer:    Cherene Ravens RDCS Referring Phys: Abdoul Encinas D Lilo Wallington  IMPRESSIONS   1. Left ventricular ejection fraction, by estimation, is 55 to 60%. The left ventricle has normal function. The left ventricle has no regional wall motion abnormalities. There is mild concentric left ventricular hypertrophy. Left ventricular diastolic parameters are consistent with  Grade I diastolic dysfunction (impaired relaxation). The average left ventricular global longitudinal strain is -14.6 %. The global longitudinal strain is abnormal. 2. Right ventricular systolic function is normal. The right ventricular size is normal. Tricuspid regurgitation signal is inadequate for assessing PA pressure. 3. The mitral valve is normal in structure. No evidence of mitral valve regurgitation. 4. The aortic valve is tricuspid. There is mild calcification of the aortic valve. There is moderate thickening of the aortic valve. Aortic valve regurgitation is moderate. 5. Aortic dilatation noted. There is moderate dilatation of the aortic root, measuring 45 mm. There is moderate dilatation of the ascending aorta, measuring 43 mm.  Comparison(s): Prior images reviewed side by side. The left ventricular function has improved. The right ventricular systolic function has improved.  FINDINGS Left Ventricle: Left ventricular ejection fraction, by estimation, is 55 to 60%. The left ventricle has normal function. The left ventricle has no regional wall motion abnormalities. The average left ventricular global longitudinal strain is -14.6 %. Strain was performed and the global longitudinal strain is abnormal. The left ventricular internal  cavity size was normal in size. There is mild concentric left ventricular hypertrophy. Left ventricular diastolic parameters are consistent with Grade I diastolic dysfunction (impaired relaxation). Normal left ventricular filling pressure.  Right Ventricle: The right ventricular size is normal. No increase in right ventricular wall thickness. Right ventricular systolic function is normal. Tricuspid regurgitation signal is inadequate for assessing PA pressure.  Left Atrium: Left atrial size was normal in size.  Right Atrium: Right atrial size was normal in size.  Pericardium: Trivial pericardial effusion is present.  Mitral Valve: The mitral valve is normal in  structure. No evidence of mitral valve regurgitation.  Tricuspid Valve: The tricuspid valve is normal in structure. Tricuspid valve regurgitation is not demonstrated.  Aortic Valve: The aortic valve is tricuspid. There is mild calcification of the aortic valve. There is moderate thickening of the aortic valve. Aortic valve regurgitation is moderate. Aortic regurgitation PHT measures 474 msec. Aortic valve mean gradient measures 5.0 mmHg. Aortic valve peak gradient measures 7.8 mmHg. Aortic valve area, by VTI measures 2.30 cm.  Pulmonic Valve: The pulmonic valve was grossly normal. Pulmonic valve regurgitation is mild. No evidence of pulmonic stenosis.  Aorta: Aortic dilatation noted. There is moderate dilatation of the aortic root, measuring 45 mm. There is moderate dilatation of the ascending aorta, measuring 43 mm.  IAS/Shunts: The interatrial septum was not well visualized.   LEFT VENTRICLE PLAX 2D LVIDd:         3.80 cm      Diastology LVIDs:         2.50 cm      LV e' medial:    4.90 cm/s LV PW:         1.30 cm      LV E/e' medial:  11.4 LV IVS:        1.10 cm      LV e' lateral:   10.20 cm/s LVOT diam:     2.20 cm      LV E/e' lateral: 5.5 LV SV:         74 LV SV Index:   35           2D Longitudinal Strain LVOT Area:     3.80 cm     2D Strain GLS (A4C):   -14.7 % 2D Strain GLS (A3C):   -13.4 % 2D Strain GLS (A2C):   -15.8 % LV Volumes (MOD)            2D Strain GLS Avg:     -14.6 % LV vol d, MOD A2C: 107.0 ml LV vol d, MOD A4C: 102.0 ml LV vol s, MOD A2C: 32.3 ml LV vol s, MOD A4C: 35.5 ml LV SV MOD A2C:     74.7 ml LV SV MOD A4C:     102.0 ml LV SV MOD BP:      68.2 ml  RIGHT VENTRICLE RV Basal diam:  3.10 cm RV Mid diam:    2.90 cm RV S prime:     10.70 cm/s TAPSE (M-mode): 2.3 cm  LEFT ATRIUM             Index        RIGHT ATRIUM           Index LA diam:        3.70 cm 1.74 cm/m   RA Area:     10.80 cm LA Vol (A2C):   41.6 ml 19.54 ml/m  RA Volume:   20.40 ml   9.58  ml/m LA Vol (A4C):   48.7 ml 22.88 ml/m LA Biplane Vol: 45.4 ml 21.33 ml/m AORTIC VALVE AV Area (Vmax):    2.83 cm AV Area (Vmean):   2.44 cm AV Area (VTI):     2.30 cm AV Vmax:           139.50 cm/s AV Vmean:          108.000 cm/s AV VTI:            0.321 m AV Peak Grad:      7.8 mmHg AV Mean Grad:      5.0 mmHg LVOT Vmax:         104.00 cm/s LVOT Vmean:        69.200 cm/s LVOT VTI:          0.194 m LVOT/AV VTI ratio: 0.60 AI PHT:            474 msec  AORTA Ao Root diam: 4.50 cm Ao Asc diam:  4.30 cm  MITRAL VALVE MV Area (PHT): 2.45 cm     SHUNTS MV Decel Time: 310 msec     Systemic VTI:  0.19 m MV E velocity: 56.10 cm/s   Systemic Diam: 2.20 cm MV A velocity: 117.00 cm/s MV E/A ratio:  0.48  Mihai Croitoru MD Electronically signed by Jerel Balding MD Signature Date/Time: 06/19/2024/3:17:31 PM    Final   TEE  ECHO TEE 02/21/2024  Narrative TRANSESOPHOGEAL ECHO REPORT    Patient Name:   Jose Hood Date of Exam: 02/21/2024 Medical Rec #:  [id number]  Height:       73.0 in Accession #:    [id number] Weight:       190.0 lb Date of Birth:  January 03, 1941   BSA:          2.105 m Patient Age:    82 years   BP:           127/79 mmHg Patient Gender: M          HR:           85 bpm. Exam Location:  Inpatient  Procedure: Transesophageal Echo, Cardiac Doppler and Color Doppler (Both Spectral and Color Flow Doppler were utilized during procedure).  Indications:     I48.91* Unspeicified atrial fibrillation  History:         Patient has prior history of Echocardiogram examinations, most recent 02/19/2024. Abnormal ECG, Arrythmias:Atrial Fibrillation; Risk Factors:Hypertension and Dyslipidemia.  Sonographer:     Ellouise Mose RDCS Referring Phys:  [id number] ALINE BRAVO GOODRICH Diagnosing Phys: Madonna Large  PROCEDURE: After discussion of the risks and benefits of a TEE, an informed consent was obtained from the patient. TEE procedure time was 28 minutes. The  transesophogeal probe was passed without difficulty through the esophogus of the patient. Imaged were obtained with the patient in a supine position. Sedation performed by different physician. The patient was monitored while under deep sedation. Anesthestetic sedation was provided intravenously by Anesthesiology: 429mg  of Propofol . Image quality was good. The patient's vital signs; including heart rate, blood pressure, and oxygen  saturation; remained stable throughout the procedure. Supplementary images were obtained from transthoracic windows as indicated to answer the clinical question. The patient developed no complications during the procedure. A successful direct current cardioversion was performed at 200 joules with 1 attempt.  IMPRESSIONS   1. Left ventricular ejection fraction, by estimation, is 40 to 45%. The left ventricle has mildly decreased function. The left ventricle demonstrates global hypokinesis. 2. Right  ventricular systolic function is mildly reduced. The right ventricular size is mildly enlarged. Mildly increased right ventricular wall thickness. 3. Left atrial size was mildly dilated. No left atrial/left atrial appendage thrombus was detected. The LAA emptying velocity was 18 cm/s. 4. A small pericardial effusion is present. The pericardial effusion is localized near the right ventricle. 5. The mitral valve is normal in structure. Mild mitral valve regurgitation. No evidence of mitral stenosis. 6. The aortic valve is tricuspid. Aortic valve regurgitation is moderate to severe. Aortic valve sclerosis is present, with no evidence of aortic valve stenosis. 7. Aortic dilatation noted. There is dilatation of the ascending aorta, measuring 44 mm. There is dilatation of the aortic root, measuring 44 mm. There is mild (Grade II) layered plaque involving the descending aorta and aortic arch. 8. Agitated saline contrast bubble study was negative, with no evidence of any interatrial  shunt.  Conclusion(s)/Recommendation(s): No LA/LAA thrombus identified. Successful cardioversion performed with restoration of normal sinus rhythm.  FINDINGS Left Ventricle: Left ventricular ejection fraction, by estimation, is 40 to 45%. The left ventricle has mildly decreased function. The left ventricle demonstrates global hypokinesis. The left ventricular internal cavity size was normal in size.  Right Ventricle: The right ventricular size is mildly enlarged. Mildly increased right ventricular wall thickness. Right ventricular systolic function is mildly reduced.  Left Atrium: Left atrial size was mildly dilated. Spontaneous echo contrast was present in the left atrium. No left atrial/left atrial appendage thrombus was detected. The LAA emptying velocity was 18 cm/s.  Right Atrium: Right atrial size was normal in size.  Pericardium: A small pericardial effusion is present. The pericardial effusion is localized near the right ventricle.  Mitral Valve: The mitral valve is normal in structure. Mild mitral valve regurgitation. No evidence of mitral valve stenosis.  Tricuspid Valve: The tricuspid valve is normal in structure. Tricuspid valve regurgitation is mild . No evidence of tricuspid stenosis.  Aortic Valve: The aortic valve is tricuspid. Aortic valve regurgitation is moderate to severe. Aortic valve sclerosis is present, with no evidence of aortic valve stenosis. Aortic valve mean gradient measures 3.0 mmHg. Aortic valve peak gradient measures 8.9 mmHg. Aortic valve area, by VTI measures 1.73 cm.  Pulmonic Valve: The pulmonic valve was normal in structure. Pulmonic valve regurgitation is trivial. No evidence of pulmonic stenosis.  Aorta: Aortic dilatation noted. There is dilatation of the ascending aorta, measuring 44 mm. There is dilatation of the aortic root, measuring 44 mm. There is mild (Grade II) layered plaque involving the descending aorta and aortic arch.  IAS/Shunts: No  atrial level shunt detected by color flow Doppler. Agitated saline contrast bubble study was negative, with no evidence of any interatrial shunt.  Additional Comments: Spectral Doppler performed.  LEFT VENTRICLE PLAX 2D LVOT diam:     2.20 cm LV SV:         37 LV SV Index:   18 LVOT Area:     3.80 cm   AORTIC VALVE AV Area (Vmax):    1.99 cm AV Area (Vmean):   2.54 cm AV Area (VTI):     1.73 cm AV Vmax:           149.00 cm/s AV Vmean:          81.200 cm/s AV VTI:            0.213 m AV Peak Grad:      8.9 mmHg AV Mean Grad:      3.0 mmHg LVOT Vmax:  78.00 cm/s LVOT Vmean:        54.300 cm/s LVOT VTI:          0.097 m LVOT/AV VTI ratio: 0.46 AR Vena Contracta: 0.52 cm  AORTA Ao Root diam: 4.40 cm Ao Asc diam:  4.40 cm   SHUNTS Systemic VTI:  0.10 m Systemic Diam: 2.20 cm  Sunit Tolia Electronically signed by Madonna Large Signature Date/Time: 02/21/2024/6:16:50 PM    Final  MONITORS  LONG TERM MONITOR (3-14 DAYS) 04/10/2024  Narrative Patch Wear Time:  13 days and 23 hours (2025-05-31T09:20:23-0400 to 2025-06-14T09:20:15-0400)  Patient had a min HR of 44 bpm (sinus bradycardia), max HR of 231 bpm (supraventricular tachycardia, 5 beats, 1.5 second duration), and avg HR of 77 bpm (normal sinus rhythm). Predominant underlying rhythm was Sinus Rhythm. 172 Supraventricular Tachycardia runs occurred, the run with the fastest interval lasting 5 beats (1.5 second duration) with a max rate of 231 bpm, the longest lasting 21.4 secs with an avg rate of 117 bpm. Isolated SVEs were rare (<1.0%), SVE Couplets were rare (<1.0%), and SVE Triplets were rare (<1.0%). Isolated VEs were rare (<1.0%), VE Couplets were rare (<1.0%), and no VE Triplets were present. Ventricular Trigeminy was present.  Impression: Supraventricular tachycardia detected (172 episodes in 14 days; longest duration 21.4 seconds). Rare ectopy.  Darryle T. Barbaraann, MD, [facility]  [facility] [address], [address] [place], [place] [address] [phone] 6:10 PM       ______________________________________________________________________________________________       Current Reported Medications:.    Current Meds  Medication Sig   sacubitril -valsartan  (ENTRESTO ) 49-51 MG Take 1 tablet by mouth 2 (two) times daily.    Physical Exam:    VS:  BP 138/78   Pulse 75   Ht 6' 1 (1.854 m)   Wt 196 lb 3.2 oz (89 kg)   SpO2 96%   BMI 25.89 kg/m    Wt Readings from Last 3 Encounters:  06/24/24 196 lb 3.2 oz (89 kg)  05/13/24 195 lb (88.5 kg)  03/13/24 191 lb (86.6 kg)    GEN: Well nourished, well developed in no acute distress NECK: No JVD; No carotid bruits CARDIAC: RRR, no murmurs, rubs, gallops RESPIRATORY:  Clear to auscultation without rales, wheezing or rhonchi  ABDOMEN: Soft, non-tender, non-distended EXTREMITIES:  No edema; No acute deformity     Asessement and Plan:.    Persistent atrial fibrillation: Patient seen in clinic on 01/25/2024, noted to be in new atrial fibrillation, as he was asymptomatic rate control strategy was selected.  However on 02/12/2024 patient presented with acute cholecystitis and found to be in A-fib with mild RVR.  Underwent TEE/DCCV on 02/21/2024, discharged on Toprol  and Eliquis . Cardiac monitor indicated an average heart rate of 77 bpm, ranging from 44 bpm to 231 bpm.  Predominant underlying rhythm was sinus rhythm.  He had 172 supraventricular tachycardia runs, run with the fastest interval lasting 5 beats with a max rate of 231, longest lasting 21.4 seconds with an average rate of 117 bpm.  Noted to have rare ectopy. At last visit patients Toprol  was increased to 75 mg daily, patient reports that he is tolerating this well.  He denies any palpitations or feeling of increased heart rates.  He denies any bleeding problems on Eliquis . Check CBC and BMET. Continue Eliquis  5 mg twice daily and metoprolol  succinate 75 mg  daily.  HF: TEE indicated LVEF of 40 to 45%, LV with global hypokinesis, RV systolic function  was mildly reduced, mildly enlarged left atrial size was mildly dilated, no left atrial/atrial appendage thrombus was detected, a small pericardial effusion was present localized near the right ventricle.  While in hospital was felt that reduction in EF was related to atrial fibrillation with RVR.  At last office visit patient was started on Entresto . Follow-up echocardiogram on echocardiogram on 06/19/2024 indicated LVEF 55 to 60%, no RWMA, mild concentric LVH, G1 DD, RV systolic function and size was normal, no evidence of mitral valve regurgitation.  There was moderate thickening of the aortic valve without evidence of stenosis, aortic valve regurgitation was moderate.  Today he reports that he is doing well, denies shortness of breath, lower extremity edema, orthopnea or PND.  He appears euvolemic and well compensated on exam.  On discussion with patient he notes that his systolic blood pressure while improved, remains at 140.  Will increase his Entresto  to 49-51 mg twice daily.  Continue metoprolol  succinate 75 mg daily. Check CBC and BMET today, repeat BMET in two weeks.   Hypertension: Blood pressure today initially 130/80, on recheck was 138/78.  Patient reports that his systolic blood pressure at home has been averaging at 140.  Will increase Entresto  to 49-51 mg twice daily.  Encouraged patient to keep blood pressure log at home and bring to follow-up.  Aortic insufficiency: Echo on 02/19/2024 indicated mild aortic valve regurgitation, TEE on 5/7 noted to have moderate to severe aortic valve regurgitation with no evidence of stenosis.  Echo on 06/19/2024 indicated moderate aortic valve regurgitation. Today he reports that he is doing well, denies any chest pain or shortness of breath. Will continue to monitor on routine echocardiograms.  AAA: Noted to be at 4.7 cm in diameter on CTA in 01/2024.   Recommend  semiannual imaging with CTA/MRI.  Echo on 9/3 indicated moderate dilation of the aortic root measuring 45 mm, moderate dilation of the acing aorta measuring 43 mm. Patient will have check of CT angio chest aorta next month.     Disposition: F/u with Levin Dagostino, NP in six weeks for blood pressure check.   Signed, Amee Boothe D Andalyn Heckstall, NP

## 2024-06-24 ENCOUNTER — Ambulatory Visit: Attending: Cardiology | Admitting: Cardiology

## 2024-06-24 ENCOUNTER — Encounter: Payer: Self-pay | Admitting: Cardiology

## 2024-06-24 VITALS — BP 138/78 | HR 75 | Ht 73.0 in | Wt 196.2 lb

## 2024-06-24 DIAGNOSIS — I77819 Aortic ectasia, unspecified site: Secondary | ICD-10-CM | POA: Diagnosis not present

## 2024-06-24 DIAGNOSIS — I4819 Other persistent atrial fibrillation: Secondary | ICD-10-CM

## 2024-06-24 DIAGNOSIS — I5020 Unspecified systolic (congestive) heart failure: Secondary | ICD-10-CM | POA: Diagnosis not present

## 2024-06-24 DIAGNOSIS — Z79899 Other long term (current) drug therapy: Secondary | ICD-10-CM | POA: Diagnosis not present

## 2024-06-24 DIAGNOSIS — I351 Nonrheumatic aortic (valve) insufficiency: Secondary | ICD-10-CM | POA: Diagnosis not present

## 2024-06-24 DIAGNOSIS — I10 Essential (primary) hypertension: Secondary | ICD-10-CM | POA: Diagnosis not present

## 2024-06-24 LAB — CBC

## 2024-06-24 MED ORDER — SACUBITRIL-VALSARTAN 49-51 MG PO TABS: 1.0000 | ORAL_TABLET | Freq: Two times a day (BID) | ORAL | 3 refills | Status: AC

## 2024-06-24 NOTE — Patient Instructions (Signed)
 Medication Instructions:  Increase Entresto  49-51 mg take one tablet twice daily *If you need a refill on your cardiac medications before your next appointment, please call your pharmacy*  Lab Work: Today- CBC, BMET 07/08/24- BMET If you have labs (blood work) drawn today and your tests are completely normal, you will receive your results only by: MyChart Message (if you have MyChart) OR A paper copy in the mail If you have any lab test that is abnormal or we need to change your treatment, we will call you to review the results.   Follow-Up: At [facility], you and your health needs are our priority.  As part of our continuing mission to provide you with exceptional heart care, our providers are all part of one team.  This team includes your primary Cardiologist (physician) and Advanced Practice Providers or APPs (Physician Assistants and Nurse Practitioners) who all work together to provide you with the care you need, when you need it.  Your next appointment:   6 week(s)  Provider:   Katlyn West, NP      Then, Jose ONEIDA Decent, MD will plan to see you again in 5-6 month(s).    We recommend signing up for the patient portal called MyChart.  Sign up information is provided on this After Visit Summary.  MyChart is used to connect with patients for Virtual Visits (Telemedicine).  Patients are able to view lab/test results, encounter notes, upcoming appointments, etc.  Non-urgent messages can be sent to your provider as well.   To learn more about what you can do with MyChart, go to [email or web address].   Other Instructions

## 2024-06-25 ENCOUNTER — Ambulatory Visit: Payer: Self-pay | Admitting: Cardiology

## 2024-06-25 LAB — CBC
Hematocrit: 53.6 — AB (ref 37.5–51.0)
Hemoglobin: 17.1 g/dL (ref 13.0–17.7)
MCH: 28.6 pg (ref 26.6–33.0)
MCHC: 31.9 g/dL (ref 31.5–35.7)
MCV: 90 fL (ref 79–97)
Platelets: 188 x10E3/uL (ref 150–450)
RBC: 5.98 x10E6/uL — AB (ref 4.14–5.80)
RDW: 13.6 (ref 11.6–15.4)
WBC: 5.1 x10E3/uL (ref 3.4–10.8)

## 2024-06-25 LAB — BASIC METABOLIC PANEL WITH GFR
BUN/Creatinine Ratio: 14 (ref 10–24)
BUN: 16 mg/dL (ref 8–27)
CO2: 21 mmol/L (ref 20–29)
Calcium: 9.6 mg/dL (ref 8.6–10.2)
Chloride: 103 mmol/L (ref 96–106)
Creatinine, Ser: 1.16 mg/dL (ref 0.76–1.27)
Glucose: 76 mg/dL (ref 70–99)
Potassium: 4.7 mmol/L (ref 3.5–5.2)
Sodium: 139 mmol/L (ref 134–144)
eGFR: 62 mL/min/1.73 (ref >59)

## 2024-07-15 DIAGNOSIS — L812 Freckles: Secondary | ICD-10-CM | POA: Diagnosis not present

## 2024-07-15 DIAGNOSIS — D2272 Melanocytic nevi of left lower limb, including hip: Secondary | ICD-10-CM | POA: Diagnosis not present

## 2024-07-15 DIAGNOSIS — L821 Other seborrheic keratosis: Secondary | ICD-10-CM | POA: Diagnosis not present

## 2024-07-15 DIAGNOSIS — Z85828 Personal history of other malignant neoplasm of skin: Secondary | ICD-10-CM | POA: Diagnosis not present

## 2024-07-15 DIAGNOSIS — L570 Actinic keratosis: Secondary | ICD-10-CM | POA: Diagnosis not present

## 2024-07-16 DIAGNOSIS — I10 Essential (primary) hypertension: Secondary | ICD-10-CM | POA: Diagnosis not present

## 2024-07-16 DIAGNOSIS — E785 Hyperlipidemia, unspecified: Secondary | ICD-10-CM | POA: Diagnosis not present

## 2024-08-20 NOTE — Progress Notes (Unsigned)
 Cardiology Office Note    Date:  08/21/2024  ID:  Jose Hood, DOB 1941-01-21, MRN [id number] PCP:  Kip Righter, MD  Cardiologist:  Darryle ONEIDA Decent, MD  Electrophysiologist:  None   Chief Complaint: Follow up for HFrEF and afib   History of Present Illness: .   Jose Hood is a 83 y.o. male with visit-pertinent history of hypertension, aortic dilation, AI, atrial fibrillation.   Patient was seen in clinic on/07/2024 by Dr. Decent.  Patient was noted to be in new atrial fibrillation, he was asymptomatic.  Given lack of symptoms it was elected that he continue with rate control strategy.  He was started on Eliquis  5 mg twice daily and metoprolol  tartrate 25 mg twice daily.   On 02/12/2024 patient presented to the emergency department with acute cholecystitis was also found to be in atrial fibrillation with mild RVR.  He was initially started on diltiazem  drip.  Cardiology was consulted for preoperative cardiac evaluation.  Patient underwent laparoscopic cholecystectomy on 02/15/2024.  Following procedure there was difficulty in keeping his heart rate controlled, echocardiogram on 02/19/2024 indicated LVEF of 45 to 50%, LV with global hypokinesis, moderate LVH, diastolic parameters were indeterminate.  RV systolic function was moderately reduced, RV was moderately enlarged, mildly elevated pulmonary artery systolic pressures.  On 02/21/2024 patient underwent TEE/DCCV, he was discharged on Toprol  and Eliquis .  TEE indicated LVEF of 40 to 45%, LV with global hypokinesis, RV systolic function was mildly reduced, mildly enlarged left atrial size was mildly dilated, no left atrial/atrial appendage thrombus was detected, a small pericardial effusion was present localized near the right ventricle, there is mild mitral regurgitation with no evidence of mitral stenosis, aortic valve regurgitation was moderate to severe, no evidence of aortic valve stenosis.  At discharge patient's home amlodipine and lisinopril  were  discontinued, he was started on metoprolol  and losartan .   He was seen in clinic on 03/13/2024.  He reported he is doing well overall.  He denies any palpitations or feeling like his heart rate had been fast.  Patient reported that when he is in the hospital his heart rate would increase he would overall feel very weak.  He denied any recurrence of this following his discharge.  Since seen in clinic on 03/13/2024 patient remained in sinus rhythm, was agreeable to 2-week cardiac monitor to reassess A-fib burden.  Cardiac monitor indicated average heart rate of 77 bpm, ranging from 44 bpm to 231 bpm.  Predominant underlying rhythm was sinus rhythm.  He had 172 supraventricular tachycardia runs, run with the fastest interval lasting 5 beats with a max rate of 231, longest lasting 21.4 seconds with an average rate of 117 bpm.  Noted to have rare ectopy.  Echocardiogram on 06/19/2024 indicated LVEF 55 to 60%, no RWMA, mild concentric LVH, G1 DD, RV systolic function and size was normal, no evidence of mitral valve regurgitation. There was moderate thickening of the aortic valve without evidence of stenosis, aortic valve regurgitation was moderate. There is moderate dilation of the aortic root measuring 45 mm, moderate dilation of the ascending aorta measuring 43 mm.   Patient was last seen in clinic on 06/24/2024 for follow-up.  He reported been doing well, denied any chest pain, shortness of breath, extremity Dema, without or PND.  He reported that his blood pressure had improved with systolic blood pressures averaging at 140.  Patient's Entresto  was increased to 49-51 mg twice daily.  Today he presents for follow-up.  He reports that he  has doing well.  He denies any chest pain, shortness of breath, lower extremity edema, orthopnea or PND.  He denies any palpitations or feeling of irregular heartbeats, presyncope or syncope.  He reports that his blood pressure has been well-controlled overall at home.  Patient  reports that he does remain somewhat active, will walk around his house and on his yard, will also occasionally use a push lawnmower and rakes leaves, tolerates well. ROS: .   Today he denies chest pain, shortness of breath, lower extremity edema, fatigue, palpitations, melena, hematuria, hemoptysis, diaphoresis, weakness, presyncope, syncope, orthopnea, and PND.  All other systems are reviewed and otherwise negative. Studies Reviewed: SABRA   EKG:  EKG is ordered today, personally reviewed, demonstrating  EKG Interpretation Date/Time:  Wednesday August 21 2024 11:20:31 EST Ventricular Rate:  62 PR Interval:  160 QRS Duration:  90 QT Interval:  408 QTC Calculation: 414 R Axis:   44  Text Interpretation: Sinus rhythm with Premature supraventricular complexes When compared with ECG of 13-Mar-2024 14:25, Premature supraventricular complexes are now Present Confirmed by Evi Mccomb [phone]) on 08/21/2024 11:25:17 AM   CV Studies: Cardiac studies reviewed are outlined and summarized above. Otherwise please see EMR for full report. Cardiac Studies & Procedures   ______________________________________________________________________________________________     ECHOCARDIOGRAM  ECHOCARDIOGRAM COMPLETE 06/19/2024  Narrative ECHOCARDIOGRAM REPORT    Patient Name:   Jose Hood    Date of Exam: 06/19/2024 Medical Rec #:  [id number]     Height:       73.0 in Accession #:    [id number]    Weight:       195.0 lb Date of Birth:  1940/10/22      BSA:          2.129 m Patient Age:    83 years      BP:           162/99 mmHg Patient Gender: M             HR:           60 bpm. Exam Location:  [address]  Procedure: 2D Echo, Cardiac Doppler, Color Doppler and Strain Analysis (Both Spectral and Color Flow Doppler were utilized during procedure).  Indications:    Persistent A-Fib I48.9  History:        Patient has prior history of Echocardiogram examinations. HF with reduced ejection fraction; Risk  Factors:Hypertension and Dyslipidemia.  Sonographer:    Cherene Ravens RDCS Referring Phys: Halil Rentz D Raynor Calcaterra  IMPRESSIONS   1. Left ventricular ejection fraction, by estimation, is 55 to 60%. The left ventricle has normal function. The left ventricle has no regional wall motion abnormalities. There is mild concentric left ventricular hypertrophy. Left ventricular diastolic parameters are consistent with Grade I diastolic dysfunction (impaired relaxation). The average left ventricular global longitudinal strain is -14.6 %. The global longitudinal strain is abnormal. 2. Right ventricular systolic function is normal. The right ventricular size is normal. Tricuspid regurgitation signal is inadequate for assessing PA pressure. 3. The mitral valve is normal in structure. No evidence of mitral valve regurgitation. 4. The aortic valve is tricuspid. There is mild calcification of the aortic valve. There is moderate thickening of the aortic valve. Aortic valve regurgitation is moderate. 5. Aortic dilatation noted. There is moderate dilatation of the aortic root, measuring 45 mm. There is moderate dilatation of the ascending aorta, measuring 43 mm.  Comparison(s): Prior images reviewed side by side. The left ventricular function has improved. The  right ventricular systolic function has improved.  FINDINGS Left Ventricle: Left ventricular ejection fraction, by estimation, is 55 to 60%. The left ventricle has normal function. The left ventricle has no regional wall motion abnormalities. The average left ventricular global longitudinal strain is -14.6 %. Strain was performed and the global longitudinal strain is abnormal. The left ventricular internal cavity size was normal in size. There is mild concentric left ventricular hypertrophy. Left ventricular diastolic parameters are consistent with Grade I diastolic dysfunction (impaired relaxation). Normal left ventricular filling pressure.  Right Ventricle: The  right ventricular size is normal. No increase in right ventricular wall thickness. Right ventricular systolic function is normal. Tricuspid regurgitation signal is inadequate for assessing PA pressure.  Left Atrium: Left atrial size was normal in size.  Right Atrium: Right atrial size was normal in size.  Pericardium: Trivial pericardial effusion is present.  Mitral Valve: The mitral valve is normal in structure. No evidence of mitral valve regurgitation.  Tricuspid Valve: The tricuspid valve is normal in structure. Tricuspid valve regurgitation is not demonstrated.  Aortic Valve: The aortic valve is tricuspid. There is mild calcification of the aortic valve. There is moderate thickening of the aortic valve. Aortic valve regurgitation is moderate. Aortic regurgitation PHT measures 474 msec. Aortic valve mean gradient measures 5.0 mmHg. Aortic valve peak gradient measures 7.8 mmHg. Aortic valve area, by VTI measures 2.30 cm.  Pulmonic Valve: The pulmonic valve was grossly normal. Pulmonic valve regurgitation is mild. No evidence of pulmonic stenosis.  Aorta: Aortic dilatation noted. There is moderate dilatation of the aortic root, measuring 45 mm. There is moderate dilatation of the ascending aorta, measuring 43 mm.  IAS/Shunts: The interatrial septum was not well visualized.   LEFT VENTRICLE PLAX 2D LVIDd:         3.80 cm      Diastology LVIDs:         2.50 cm      LV e' medial:    4.90 cm/s LV PW:         1.30 cm      LV E/e' medial:  11.4 LV IVS:        1.10 cm      LV e' lateral:   10.20 cm/s LVOT diam:     2.20 cm      LV E/e' lateral: 5.5 LV SV:         74 LV SV Index:   35           2D Longitudinal Strain LVOT Area:     3.80 cm     2D Strain GLS (A4C):   -14.7 % 2D Strain GLS (A3C):   -13.4 % 2D Strain GLS (A2C):   -15.8 % LV Volumes (MOD)            2D Strain GLS Avg:     -14.6 % LV vol d, MOD A2C: 107.0 ml LV vol d, MOD A4C: 102.0 ml LV vol s, MOD A2C: 32.3 ml LV vol  s, MOD A4C: 35.5 ml LV SV MOD A2C:     74.7 ml LV SV MOD A4C:     102.0 ml LV SV MOD BP:      68.2 ml  RIGHT VENTRICLE RV Basal diam:  3.10 cm RV Mid diam:    2.90 cm RV S prime:     10.70 cm/s TAPSE (M-mode): 2.3 cm  LEFT ATRIUM             Index  RIGHT ATRIUM           Index LA diam:        3.70 cm 1.74 cm/m   RA Area:     10.80 cm LA Vol (A2C):   41.6 ml 19.54 ml/m  RA Volume:   20.40 ml  9.58 ml/m LA Vol (A4C):   48.7 ml 22.88 ml/m LA Biplane Vol: 45.4 ml 21.33 ml/m AORTIC VALVE AV Area (Vmax):    2.83 cm AV Area (Vmean):   2.44 cm AV Area (VTI):     2.30 cm AV Vmax:           139.50 cm/s AV Vmean:          108.000 cm/s AV VTI:            0.321 m AV Peak Grad:      7.8 mmHg AV Mean Grad:      5.0 mmHg LVOT Vmax:         104.00 cm/s LVOT Vmean:        69.200 cm/s LVOT VTI:          0.194 m LVOT/AV VTI ratio: 0.60 AI PHT:            474 msec  AORTA Ao Root diam: 4.50 cm Ao Asc diam:  4.30 cm  MITRAL VALVE MV Area (PHT): 2.45 cm     SHUNTS MV Decel Time: 310 msec     Systemic VTI:  0.19 m MV E velocity: 56.10 cm/s   Systemic Diam: 2.20 cm MV A velocity: 117.00 cm/s MV E/A ratio:  0.48  Mihai Croitoru MD Electronically signed by Jerel Balding MD Signature Date/Time: 06/19/2024/3:17:31 PM    Final   TEE  ECHO TEE 02/21/2024  Narrative TRANSESOPHOGEAL ECHO REPORT    Patient Name:   Jose Hood Date of Exam: 02/21/2024 Medical Rec #:  [id number]  Height:       73.0 in Accession #:    [id number] Weight:       190.0 lb Date of Birth:  07/21/1941   BSA:          2.105 m Patient Age:    82 years   BP:           127/79 mmHg Patient Gender: M          HR:           85 bpm. Exam Location:  Inpatient  Procedure: Transesophageal Echo, Cardiac Doppler and Color Doppler (Both Spectral and Color Flow Doppler were utilized during procedure).  Indications:     I48.91* Unspeicified atrial fibrillation  History:         Patient has prior history of  Echocardiogram examinations, most recent 02/19/2024. Abnormal ECG, Arrythmias:Atrial Fibrillation; Risk Factors:Hypertension and Dyslipidemia.  Sonographer:     Ellouise Mose RDCS Referring Phys:  [id number] ALINE BRAVO GOODRICH Diagnosing Phys: Madonna Large  PROCEDURE: After discussion of the risks and benefits of a TEE, an informed consent was obtained from the patient. TEE procedure time was 28 minutes. The transesophogeal probe was passed without difficulty through the esophogus of the patient. Imaged were obtained with the patient in a supine position. Sedation performed by different physician. The patient was monitored while under deep sedation. Anesthestetic sedation was provided intravenously by Anesthesiology: 429mg  of Propofol . Image quality was good. The patient's vital signs; including heart rate, blood pressure, and oxygen  saturation; remained stable throughout the procedure. Supplementary images were obtained from transthoracic windows as indicated to answer  the clinical question. The patient developed no complications during the procedure. A successful direct current cardioversion was performed at 200 joules with 1 attempt.  IMPRESSIONS   1. Left ventricular ejection fraction, by estimation, is 40 to 45%. The left ventricle has mildly decreased function. The left ventricle demonstrates global hypokinesis. 2. Right ventricular systolic function is mildly reduced. The right ventricular size is mildly enlarged. Mildly increased right ventricular wall thickness. 3. Left atrial size was mildly dilated. No left atrial/left atrial appendage thrombus was detected. The LAA emptying velocity was 18 cm/s. 4. A small pericardial effusion is present. The pericardial effusion is localized near the right ventricle. 5. The mitral valve is normal in structure. Mild mitral valve regurgitation. No evidence of mitral stenosis. 6. The aortic valve is tricuspid. Aortic valve regurgitation is moderate to severe.  Aortic valve sclerosis is present, with no evidence of aortic valve stenosis. 7. Aortic dilatation noted. There is dilatation of the ascending aorta, measuring 44 mm. There is dilatation of the aortic root, measuring 44 mm. There is mild (Grade II) layered plaque involving the descending aorta and aortic arch. 8. Agitated saline contrast bubble study was negative, with no evidence of any interatrial shunt.  Conclusion(s)/Recommendation(s): No LA/LAA thrombus identified. Successful cardioversion performed with restoration of normal sinus rhythm.  FINDINGS Left Ventricle: Left ventricular ejection fraction, by estimation, is 40 to 45%. The left ventricle has mildly decreased function. The left ventricle demonstrates global hypokinesis. The left ventricular internal cavity size was normal in size.  Right Ventricle: The right ventricular size is mildly enlarged. Mildly increased right ventricular wall thickness. Right ventricular systolic function is mildly reduced.  Left Atrium: Left atrial size was mildly dilated. Spontaneous echo contrast was present in the left atrium. No left atrial/left atrial appendage thrombus was detected. The LAA emptying velocity was 18 cm/s.  Right Atrium: Right atrial size was normal in size.  Pericardium: A small pericardial effusion is present. The pericardial effusion is localized near the right ventricle.  Mitral Valve: The mitral valve is normal in structure. Mild mitral valve regurgitation. No evidence of mitral valve stenosis.  Tricuspid Valve: The tricuspid valve is normal in structure. Tricuspid valve regurgitation is mild . No evidence of tricuspid stenosis.  Aortic Valve: The aortic valve is tricuspid. Aortic valve regurgitation is moderate to severe. Aortic valve sclerosis is present, with no evidence of aortic valve stenosis. Aortic valve mean gradient measures 3.0 mmHg. Aortic valve peak gradient measures 8.9 mmHg. Aortic valve area, by VTI measures 1.73  cm.  Pulmonic Valve: The pulmonic valve was normal in structure. Pulmonic valve regurgitation is trivial. No evidence of pulmonic stenosis.  Aorta: Aortic dilatation noted. There is dilatation of the ascending aorta, measuring 44 mm. There is dilatation of the aortic root, measuring 44 mm. There is mild (Grade II) layered plaque involving the descending aorta and aortic arch.  IAS/Shunts: No atrial level shunt detected by color flow Doppler. Agitated saline contrast bubble study was negative, with no evidence of any interatrial shunt.  Additional Comments: Spectral Doppler performed.  LEFT VENTRICLE PLAX 2D LVOT diam:     2.20 cm LV SV:         37 LV SV Index:   18 LVOT Area:     3.80 cm   AORTIC VALVE AV Area (Vmax):    1.99 cm AV Area (Vmean):   2.54 cm AV Area (VTI):     1.73 cm AV Vmax:  149.00 cm/s AV Vmean:          81.200 cm/s AV VTI:            0.213 m AV Peak Grad:      8.9 mmHg AV Mean Grad:      3.0 mmHg LVOT Vmax:         78.00 cm/s LVOT Vmean:        54.300 cm/s LVOT VTI:          0.097 m LVOT/AV VTI ratio: 0.46 AR Vena Contracta: 0.52 cm  AORTA Ao Root diam: 4.40 cm Ao Asc diam:  4.40 cm   SHUNTS Systemic VTI:  0.10 m Systemic Diam: 2.20 cm  Sunit Tolia Electronically signed by Madonna Large Signature Date/Time: 02/21/2024/6:16:50 PM    Final  MONITORS  LONG TERM MONITOR (3-14 DAYS) 04/10/2024  Narrative Patch Wear Time:  13 days and 23 hours (2025-05-31T09:20:23-0400 to 2025-06-14T09:20:15-0400)  Patient had a min HR of 44 bpm (sinus bradycardia), max HR of 231 bpm (supraventricular tachycardia, 5 beats, 1.5 second duration), and avg HR of 77 bpm (normal sinus rhythm). Predominant underlying rhythm was Sinus Rhythm. 172 Supraventricular Tachycardia runs occurred, the run with the fastest interval lasting 5 beats (1.5 second duration) with a max rate of 231 bpm, the longest lasting 21.4 secs with an avg rate of 117 bpm. Isolated SVEs  were rare (<1.0%), SVE Couplets were rare (<1.0%), and SVE Triplets were rare (<1.0%). Isolated VEs were rare (<1.0%), VE Couplets were rare (<1.0%), and no VE Triplets were present. Ventricular Trigeminy was present.  Impression: Supraventricular tachycardia detected (172 episodes in 14 days; longest duration 21.4 seconds). Rare ectopy.  Darryle T. Barbaraann, MD, [facility]  [facility] [address] [place], [place] [address] [phone] 6:10 PM       ______________________________________________________________________________________________       Current Reported Medications:.    Current Meds  Medication Sig   apixaban  (ELIQUIS ) 5 MG TABS tablet Take 1 tablet (5 mg total) by mouth 2 (two) times daily.   finasteride  (PROSCAR ) 5 MG tablet TAKE 1 TABLET BY MOUTH EVERY DAY   metoprolol  succinate (TOPROL -XL) 50 MG 24 hr tablet Take 1.5 tablets (75 mg total) by mouth daily. Take with or immediately following a meal.   probenecid  (BENEMID ) 500 MG tablet Take 1 tablet (500 mg total) by mouth daily.   sacubitril -valsartan  (ENTRESTO ) 49-51 MG Take 1 tablet by mouth 2 (two) times daily.   tamsulosin  (FLOMAX ) 0.4 MG CAPS capsule TAKE ONE CAPSULE BY MOUTH EVERY DAY    Physical Exam:    VS:  BP 128/72   Pulse 62   Ht 6' 1 (1.854 m)   Wt 193 lb (87.5 kg)   SpO2 97%   BMI 25.46 kg/m    Wt Readings from Last 3 Encounters:  08/21/24 193 lb (87.5 kg)  06/24/24 196 lb 3.2 oz (89 kg)  05/13/24 195 lb (88.5 kg)    GEN: Well nourished, well developed in no acute distress NECK: No JVD; No carotid bruits CARDIAC: RRR, no murmurs, rubs, gallops RESPIRATORY:  Clear to auscultation without rales, wheezing or rhonchi  ABDOMEN: Soft, non-tender, non-distended EXTREMITIES:  No edema; No acute deformity     Asessement and Plan:.     Persistent atrial fibrillation: Patient seen in clinic on 01/25/2024, noted to be in new atrial fibrillation, as he was asymptomatic rate  control strategy was selected.  However on 02/12/2024 patient presented with acute cholecystitis and  found to be in A-fib with mild RVR.  Underwent TEE/DCCV on 02/21/2024, discharged on Toprol  and Eliquis . Cardiac monitor indicated an average heart rate of 77 bpm, ranging from 44 bpm to 231 bpm.  Predominant underlying rhythm was sinus rhythm.  He had 172 supraventricular tachycardia runs, run with the fastest interval lasting 5 beats with a max rate of 231, longest lasting 21.4 seconds with an average rate of 117 bpm.  Noted to have rare ectopy. Today he reports that he is doing well, EKG indicates he is in normal sinus rhythm.  He denies any increased palpitations or feeling of irregular heartbeats.  He denies any bleeding problems on Eliquis . Continue Eliquis  5 mg twice daily, metoprolol  succinate 75 mg daily.  HF: TEE indicated LVEF of 40 to 45%, LV with global hypokinesis, RV systolic function was mildly reduced, mildly enlarged left atrial size was mildly dilated, no left atrial/atrial appendage thrombus was detected, a small pericardial effusion was present localized near the right ventricle.  While in hospital was felt that reduction in EF was related to atrial fibrillation with RVR.  At last office visit patient was started on Entresto . Follow-up echocardiogram on echocardiogram on 06/19/2024 indicated LVEF 55 to 60%, no RWMA, mild concentric LVH, G1 DD, RV systolic function and size was normal, no evidence of mitral valve regurgitation.  There was moderate thickening of the aortic valve without evidence of stenosis, aortic valve regurgitation was moderate. Today he reports that he is doing well denies any increased lower extremity edema, orthopnea or PND.  He appears euvolemic and well compensated on exam. Check BMET today. Continue metoprolol  succinate 75 mg daily, Entresto  49-51 mg twice daily.  Hypertension: Blood pressure today initially 122/78, on recheck 128/72.  Continue current antihypertensive  regimen.  Aortic insufficiency:  Echo on 02/19/2024 indicated mild aortic valve regurgitation, TEE on 5/7 noted to have moderate to severe aortic valve regurgitation with no evidence of stenosis.  Echo on 06/19/2024 indicated moderate aortic valve regurgitation. Today he reports that he is doing well, denies any increased shortness of breath, lower extremity edema, orthopnea or PND.  AAA: Noted to be at 4.7 cm in diameter on CTA in 01/2024.   Recommend semiannual imaging with CTA/MRI.  Echo on 9/3 indicated moderate dilation of the aortic root measuring 45 mm, moderate dilation of the acing aorta measuring 43 mm.  Patient CTA chest aorta currently pending.   Disposition: F/u with Dr. Barbaraann in 4-5 months or sooner if needed.   Signed, Livi Mcgann D Shanard Treto, NP

## 2024-08-21 ENCOUNTER — Ambulatory Visit: Attending: Cardiology | Admitting: Cardiology

## 2024-08-21 ENCOUNTER — Encounter: Payer: Self-pay | Admitting: Cardiology

## 2024-08-21 VITALS — BP 128/72 | HR 62 | Ht 73.0 in | Wt 193.0 lb

## 2024-08-21 DIAGNOSIS — I77819 Aortic ectasia, unspecified site: Secondary | ICD-10-CM

## 2024-08-21 DIAGNOSIS — Z79899 Other long term (current) drug therapy: Secondary | ICD-10-CM

## 2024-08-21 DIAGNOSIS — I5020 Unspecified systolic (congestive) heart failure: Secondary | ICD-10-CM | POA: Diagnosis not present

## 2024-08-21 DIAGNOSIS — I351 Nonrheumatic aortic (valve) insufficiency: Secondary | ICD-10-CM

## 2024-08-21 DIAGNOSIS — I10 Essential (primary) hypertension: Secondary | ICD-10-CM

## 2024-08-21 DIAGNOSIS — I4819 Other persistent atrial fibrillation: Secondary | ICD-10-CM | POA: Diagnosis not present

## 2024-08-21 NOTE — Patient Instructions (Signed)
 Medication Instructions:  NO CHANGES  *If you need a refill on your cardiac medications before your next appointment, please call your pharmacy*  Lab Work: BMET today -- 1st floor  If you have labs (blood work) drawn today and your tests are completely normal, you will receive your results only by: MyChart Message (if you have MyChart) OR A paper copy in the mail If you have any lab test that is abnormal or we need to change your treatment, we will call you to review the results.   Follow-Up: At [facility], you and your health needs are our priority.  As part of our continuing mission to provide you with exceptional heart care, our providers are all part of one team.  This team includes your primary Cardiologist (physician) and Advanced Practice Providers or APPs (Physician Assistants and Nurse Practitioners) who all work together to provide you with the care you need, when you need it.  Your next appointment:    4-5 months with Dr. Barbaraann  We recommend signing up for the patient portal called MyChart.  Sign up information is provided on this After Visit Summary.  MyChart is used to connect with patients for Virtual Visits (Telemedicine).  Patients are able to view lab/test results, encounter notes, upcoming appointments, etc.  Non-urgent messages can be sent to your provider as well.   To learn more about what you can do with MyChart, go to [email or web address].

## 2024-08-22 LAB — BASIC METABOLIC PANEL WITH GFR
BUN/Creatinine Ratio: 17 (ref 10–24)
BUN: 18 mg/dL (ref 8–27)
CO2: 22 mmol/L (ref 20–29)
Calcium: 9.7 mg/dL (ref 8.6–10.2)
Chloride: 101 mmol/L (ref 96–106)
Creatinine, Ser: 1.08 mg/dL (ref 0.76–1.27)
Glucose: 68 mg/dL — ABNORMAL LOW (ref 70–99)
Potassium: 4.7 mmol/L (ref 3.5–5.2)
Sodium: 140 mmol/L (ref 134–144)
eGFR: 68 mL/min/1.73 (ref >59)

## 2024-08-22 NOTE — Telephone Encounter (Signed)
 Spoke to pt regarding lab results. Pt verbalized understanding and had no questions at this time.

## 2024-10-22 ENCOUNTER — Other Ambulatory Visit: Payer: Self-pay | Admitting: Cardiovascular Disease

## 2024-10-22 DIAGNOSIS — I4819 Other persistent atrial fibrillation: Secondary | ICD-10-CM

## 2024-12-23 ENCOUNTER — Ambulatory Visit: Admitting: Cardiovascular Disease
